# Patient Record
Sex: Female | Born: 1945 | Race: Black or African American | Hispanic: No | Marital: Married | Smoking: Never smoker
Health system: Southern US, Community
[De-identification: ages and names within clinical notes are randomized; demographics above are authoritative.]

## PROBLEM LIST (undated history)

## (undated) DIAGNOSIS — Z83719 Family history of colon polyps, unspecified: Secondary | ICD-10-CM

## (undated) DIAGNOSIS — R51 Headache: Secondary | ICD-10-CM

## (undated) DIAGNOSIS — M1990 Unspecified osteoarthritis, unspecified site: Secondary | ICD-10-CM

## (undated) DIAGNOSIS — R519 Headache, unspecified: Secondary | ICD-10-CM

## (undated) DIAGNOSIS — B019 Varicella without complication: Secondary | ICD-10-CM

## (undated) DIAGNOSIS — I10 Essential (primary) hypertension: Secondary | ICD-10-CM

## (undated) DIAGNOSIS — H409 Unspecified glaucoma: Secondary | ICD-10-CM

## (undated) DIAGNOSIS — E119 Type 2 diabetes mellitus without complications: Secondary | ICD-10-CM

## (undated) DIAGNOSIS — N189 Chronic kidney disease, unspecified: Secondary | ICD-10-CM

## (undated) DIAGNOSIS — R011 Cardiac murmur, unspecified: Secondary | ICD-10-CM

## (undated) DIAGNOSIS — Z8371 Family history of colonic polyps: Secondary | ICD-10-CM

## (undated) HISTORY — DX: Headache, unspecified: R51.9

## (undated) HISTORY — DX: Unspecified osteoarthritis, unspecified site: M19.90

## (undated) HISTORY — DX: Family history of colonic polyps: Z83.71

## (undated) HISTORY — DX: Family history of colon polyps, unspecified: Z83.719

## (undated) HISTORY — DX: Unspecified glaucoma: H40.9

## (undated) HISTORY — DX: Cardiac murmur, unspecified: R01.1

## (undated) HISTORY — PX: CHOLECYSTECTOMY: SHX55

## (undated) HISTORY — PX: WISDOM TOOTH EXTRACTION: SHX21

## (undated) HISTORY — PX: KNEE ARTHROSCOPY: SHX127

## (undated) HISTORY — DX: Headache: R51

## (undated) HISTORY — PX: EYE SURGERY: SHX253

## (undated) HISTORY — PX: ABDOMINAL HYSTERECTOMY: SHX81

## (undated) HISTORY — PX: COLONOSCOPY: SHX174

## (undated) HISTORY — PX: TUBAL LIGATION: SHX77

## (undated) HISTORY — DX: Varicella without complication: B01.9

## (undated) HISTORY — PX: FOOT SURGERY: SHX648

---

## 2014-05-08 LAB — LIPID PANEL
Cholesterol: 151 mg/dL (ref 0–200)
HDL: 82 mg/dL — AB (ref 35–70)
LDL Cholesterol: 56 mg/dL
Triglycerides: 65 mg/dL (ref 40–160)

## 2014-05-08 LAB — TSH: TSH: 1.1 u[IU]/mL (ref <=5.90)

## 2014-08-28 LAB — HEMOGLOBIN A1C: HEMOGLOBIN A1C: 6

## 2014-10-01 LAB — CBC AND DIFFERENTIAL
HEMATOCRIT: 40 % (ref 36–46)
Hemoglobin: 13.1 g/dL (ref 12.0–16.0)
Platelets: 371 10*3/uL (ref 150–399)
WBC: 5.8 10^3/mL

## 2014-10-01 LAB — BASIC METABOLIC PANEL
BUN: 10 mg/dL (ref 4–21)
CREATININE: 0.7 mg/dL (ref 0.5–1.1)
POTASSIUM: 4.4 mmol/L (ref 3.4–5.3)
Sodium: 142 mmol/L (ref 137–147)

## 2014-11-05 DIAGNOSIS — K3189 Other diseases of stomach and duodenum: Secondary | ICD-10-CM | POA: Insufficient documentation

## 2015-01-25 HISTORY — PX: TUMOR REMOVAL: SHX12

## 2015-06-19 LAB — LIPID PANEL
CHOLESTEROL: 143 mg/dL (ref 0–200)
HDL: 67 mg/dL (ref 35–70)
LDL CALC: 59 mg/dL

## 2015-06-19 LAB — HEMOGLOBIN A1C: Hemoglobin A1C: 6.5

## 2015-10-28 LAB — LIPID PANEL
Cholesterol: 163 mg/dL (ref 0–200)
HDL: 71 mg/dL — AB (ref 35–70)
LDL Cholesterol: 72 mg/dL
Triglycerides: 100 mg/dL (ref 40–160)

## 2015-10-28 LAB — HEMOGLOBIN A1C: HEMOGLOBIN A1C: 5.9

## 2016-01-17 ENCOUNTER — Encounter (HOSPITAL_COMMUNITY): Payer: Self-pay

## 2016-01-17 ENCOUNTER — Emergency Department (HOSPITAL_COMMUNITY)
Admission: EM | Admit: 2016-01-17 | Discharge: 2016-01-17 | Disposition: A | Discharge status: Home / Self Care | Payer: Medicare Other | Attending: Emergency Medicine | Admitting: Emergency Medicine

## 2016-01-17 DIAGNOSIS — Z7984 Long term (current) use of oral hypoglycemic drugs: Secondary | ICD-10-CM | POA: Diagnosis not present

## 2016-01-17 DIAGNOSIS — R197 Diarrhea, unspecified: Secondary | ICD-10-CM | POA: Insufficient documentation

## 2016-01-17 DIAGNOSIS — R112 Nausea with vomiting, unspecified: Secondary | ICD-10-CM | POA: Insufficient documentation

## 2016-01-17 DIAGNOSIS — E119 Type 2 diabetes mellitus without complications: Secondary | ICD-10-CM | POA: Insufficient documentation

## 2016-01-17 DIAGNOSIS — I10 Essential (primary) hypertension: Secondary | ICD-10-CM | POA: Insufficient documentation

## 2016-01-17 HISTORY — DX: Essential (primary) hypertension: I10

## 2016-01-17 HISTORY — DX: Type 2 diabetes mellitus without complications: E11.9

## 2016-01-17 LAB — URINALYSIS, ROUTINE W REFLEX MICROSCOPIC
Bilirubin Urine: NEGATIVE
GLUCOSE, UA: NEGATIVE mg/dL
KETONES UR: NEGATIVE mg/dL
LEUKOCYTES UA: NEGATIVE
Nitrite: POSITIVE — AB
PROTEIN: NEGATIVE mg/dL
Specific Gravity, Urine: 1.021 (ref 1.005–1.030)
pH: 5 (ref 5.0–8.0)

## 2016-01-17 LAB — COMPREHENSIVE METABOLIC PANEL
ALT: 20 U/L (ref 14–54)
ANION GAP: 8 (ref 5–15)
AST: 23 U/L (ref 15–41)
Albumin: 3.9 g/dL (ref 3.5–5.0)
Alkaline Phosphatase: 62 U/L (ref 38–126)
BUN: 12 mg/dL (ref 6–20)
CALCIUM: 9.4 mg/dL (ref 8.9–10.3)
CHLORIDE: 103 mmol/L (ref 101–111)
CO2: 28 mmol/L (ref 22–32)
CREATININE: 0.83 mg/dL (ref 0.44–1.00)
Glucose, Bld: 125 mg/dL — ABNORMAL HIGH (ref 65–99)
Potassium: 4.3 mmol/L (ref 3.5–5.1)
SODIUM: 139 mmol/L (ref 135–145)
Total Bilirubin: 0.9 mg/dL (ref 0.3–1.2)
Total Protein: 7.7 g/dL (ref 6.5–8.1)

## 2016-01-17 LAB — CBC
HCT: 43.1 % (ref 36.0–46.0)
HEMOGLOBIN: 14 g/dL (ref 12.0–15.0)
MCH: 26.4 pg (ref 26.0–34.0)
MCHC: 32.5 g/dL (ref 30.0–36.0)
MCV: 81.3 fL (ref 78.0–100.0)
PLATELETS: 329 10*3/uL (ref 150–400)
RBC: 5.3 MIL/uL — AB (ref 3.87–5.11)
RDW: 14.9 % (ref 11.5–15.5)
WBC: 8 10*3/uL (ref 4.0–10.5)

## 2016-01-17 LAB — LIPASE, BLOOD: LIPASE: 21 U/L (ref 11–51)

## 2016-01-17 MED ORDER — MORPHINE SULFATE (PF) 4 MG/ML IV SOLN
4.0000 mg | Freq: Once | INTRAVENOUS | Status: AC
  Administered 2016-01-17: 4 mg via INTRAVENOUS
  Filled 2016-01-17: qty 1

## 2016-01-17 MED ORDER — SODIUM CHLORIDE 0.9 % IV BOLUS (SEPSIS)
2000.0000 mL | Freq: Once | INTRAVENOUS | Status: AC
  Administered 2016-01-17: 2000 mL via INTRAVENOUS

## 2016-01-17 MED ORDER — LOPERAMIDE HCL 2 MG PO CAPS: 2.0000 mg | ORAL_CAPSULE | Freq: Four times a day (QID) | ORAL | 0 refills | Status: DC | PRN

## 2016-01-17 MED ORDER — ONDANSETRON HCL 4 MG/2ML IJ SOLN
4.0000 mg | Freq: Once | INTRAMUSCULAR | Status: AC
  Administered 2016-01-17: 4 mg via INTRAVENOUS
  Filled 2016-01-17: qty 2

## 2016-01-17 MED ORDER — ONDANSETRON 8 MG PO TBDP: 8.0000 mg | ORAL_TABLET | Freq: Three times a day (TID) | ORAL | 0 refills | Status: DC | PRN

## 2016-01-17 NOTE — ED Provider Notes (Signed)
[facility] DEPT Provider Note   CSN: [id number] Arrival date & time: 01/17/16  1145     History   Chief Complaint Chief Complaint  Patient presents with  . Emesis  . Diarrhea    HPI Kelsey Conway is a 70 y.o. female.  HPI Patient presents to the emergency room with complaints of vomiting and diarrhea. Patient went out to eat last evening. About 30 minutes after finishing her meal and leaving the restaurant she started having vomiting, diarrhea and abdominal pain. No one else became ill though. She's had maybe 5-6 episodes of vomiting as well as the same amount of diarrhea since then. The emesis has been dark. She has not noticed any blood in her stool.  She is having diffuse abdominal cramping that is moderate to severe.  She went to an urgent care who sent her to the ED. Past Medical History:  Diagnosis Date  . Diabetes mellitus without complication ([place])   . Hypertension     There are no active problems to display for this patient.   History reviewed. No pertinent surgical history.  OB History    No data available       Home Medications    Prior to Admission medications   Medication Sig Start Date End Date Taking? Authorizing Provider  atorvastatin (LIPITOR) 20 MG tablet Take 20 mg by mouth daily.   Yes Historical Provider, MD  hydrochlorothiazide (HYDRODIURIL) 25 MG tablet Take 25 mg by mouth daily.   Yes Historical Provider, MD  metFORMIN (GLUCOPHAGE) 500 MG tablet Take 500 mg by mouth 2 (two) times daily with a meal.   Yes Historical Provider, MD  loperamide (IMODIUM) 2 MG capsule Take 1 capsule (2 mg total) by mouth 4 (four) times daily as needed for diarrhea or loose stools. 01/17/16   Dorie Rank, MD  ondansetron (ZOFRAN ODT) 8 MG disintegrating tablet Take 1 tablet (8 mg total) by mouth every 8 (eight) hours as needed for nausea or vomiting. 01/17/16   Dorie Rank, MD    Family History No family history on file.  Social History Social History  Substance  Use Topics  . Smoking status: Never Smoker  . Smokeless tobacco: Never Used  . Alcohol use No     Allergies   Patient has no known allergies.   Review of Systems Review of Systems  All other systems reviewed and are negative.    Physical Exam Updated Vital Signs BP 159/71   Pulse 76   Temp 98.6 F (37 C) (Oral)   Resp 16   Ht 5\' 3"  (1.6 m)   Wt 87.1 kg   SpO2 99%   BMI 34.01 kg/m   Physical Exam  Constitutional: She appears well-developed and well-nourished. No distress.  HENT:  Head: Normocephalic and atraumatic.  Right Ear: External ear normal.  Left Ear: External ear normal.  Eyes: Conjunctivae are normal. Right eye exhibits no discharge. Left eye exhibits no discharge. No scleral icterus.  Neck: Neck supple. No tracheal deviation present.  Cardiovascular: Normal rate, regular rhythm and intact distal pulses.   Pulmonary/Chest: Effort normal and breath sounds normal. No stridor. No respiratory distress. She has no wheezes. She has no rales.  Abdominal: Soft. Bowel sounds are normal. She exhibits no distension. There is generalized tenderness. There is no rebound and no guarding.  Musculoskeletal: She exhibits no edema or tenderness.  Neurological: She is alert. She has normal strength. No cranial nerve deficit (no facial droop, extraocular movements intact, no slurred speech)  or sensory deficit. She exhibits normal muscle tone. She displays no seizure activity. Coordination normal.  Skin: Skin is warm and dry. No rash noted.  Psychiatric: She has a normal mood and affect.  Nursing note and vitals reviewed.    ED Treatments / Results  Labs (all labs ordered are listed, but only abnormal results are displayed) Labs Reviewed  COMPREHENSIVE METABOLIC PANEL - Abnormal; Notable for the following:       Result Value   Glucose, Bld 125 (*)    All other components within normal limits  CBC - Abnormal; Notable for the following:    RBC 5.30 (*)    All other  components within normal limits  URINALYSIS, ROUTINE W REFLEX MICROSCOPIC - Abnormal; Notable for the following:    APPearance HAZY (*)    Hgb urine dipstick MODERATE (*)    Nitrite POSITIVE (*)    Bacteria, UA MANY (*)    Squamous Epithelial / LPF 0-5 (*)    All other components within normal limits  LIPASE, BLOOD   Procedures Procedures (including critical care time)  Medications Ordered in ED Medications  sodium chloride 0.9 % bolus 2,000 mL (0 mLs Intravenous Stopped 01/17/16 1415)  ondansetron (ZOFRAN) injection 4 mg (4 mg Intravenous Given 01/17/16 1313)  morphine 4 MG/ML injection 4 mg (4 mg Intravenous Given 01/17/16 1313)     Initial Impression / Assessment and Plan / ED Course  I have reviewed the triage vital signs and the nursing notes.  Pertinent labs & imaging results that were available during my care of the patient were reviewed by me and considered in my medical decision making (see chart for details).  Clinical Course   Patient's symptoms improved with IV fluids, antiemetics and pain medications. She has no focal tenderness on exam. Laboratory tests are reassuring.  I suspect her symptoms are related to a viral gastroenteritis.  Plan on discharge home with prescription for Zofran and Imodium. Warning signs and precautions were discussed.   Final Clinical Impressions(s) / ED Diagnoses   Final diagnoses:  Nausea vomiting and diarrhea    New Prescriptions New Prescriptions   LOPERAMIDE (IMODIUM) 2 MG CAPSULE    Take 1 capsule (2 mg total) by mouth 4 (four) times daily as needed for diarrhea or loose stools.   ONDANSETRON (ZOFRAN ODT) 8 MG DISINTEGRATING TABLET    Take 1 tablet (8 mg total) by mouth every 8 (eight) hours as needed for nausea or vomiting.     Dorie Rank, MD 01/17/16 [phone]

## 2016-01-17 NOTE — ED Triage Notes (Signed)
Patient complains of abdominal pain and cramping after eating japanese last pm with vomiting and diarrhea. Patient here for ongoing discomfort and possible dehydration

## 2016-04-21 ENCOUNTER — Ambulatory Visit (INDEPENDENT_AMBULATORY_CARE_PROVIDER_SITE_OTHER): Payer: Medicare Other | Admitting: Physician Assistant

## 2016-04-21 ENCOUNTER — Encounter: Payer: Self-pay | Admitting: Physician Assistant

## 2016-04-21 ENCOUNTER — Ambulatory Visit (INDEPENDENT_AMBULATORY_CARE_PROVIDER_SITE_OTHER): Payer: Medicare Other

## 2016-04-21 ENCOUNTER — Emergency Department (HOSPITAL_COMMUNITY)
Admission: EM | Admit: 2016-04-21 | Discharge: 2016-04-21 | Disposition: A | Discharge status: Home / Self Care | Payer: Medicare Other | Attending: Emergency Medicine | Admitting: Emergency Medicine

## 2016-04-21 ENCOUNTER — Encounter (HOSPITAL_COMMUNITY): Payer: Self-pay

## 2016-04-21 VITALS — BP 136/84 | HR 64 | Temp 98.2°F | Resp 16 | Ht 64.0 in | Wt 193.0 lb

## 2016-04-21 DIAGNOSIS — R9431 Abnormal electrocardiogram [ECG] [EKG]: Secondary | ICD-10-CM

## 2016-04-21 DIAGNOSIS — R0789 Other chest pain: Secondary | ICD-10-CM | POA: Diagnosis not present

## 2016-04-21 DIAGNOSIS — Z79899 Other long term (current) drug therapy: Secondary | ICD-10-CM | POA: Insufficient documentation

## 2016-04-21 DIAGNOSIS — E119 Type 2 diabetes mellitus without complications: Secondary | ICD-10-CM | POA: Insufficient documentation

## 2016-04-21 DIAGNOSIS — I10 Essential (primary) hypertension: Secondary | ICD-10-CM | POA: Insufficient documentation

## 2016-04-21 DIAGNOSIS — Z91040 Latex allergy status: Secondary | ICD-10-CM | POA: Diagnosis not present

## 2016-04-21 DIAGNOSIS — Z7984 Long term (current) use of oral hypoglycemic drugs: Secondary | ICD-10-CM | POA: Insufficient documentation

## 2016-04-21 LAB — BASIC METABOLIC PANEL
Anion gap: 10 (ref 5–15)
BUN: 10 mg/dL (ref 6–20)
CALCIUM: 9.4 mg/dL (ref 8.9–10.3)
CO2: 29 mmol/L (ref 22–32)
CREATININE: 0.75 mg/dL (ref 0.44–1.00)
Chloride: 101 mmol/L (ref 101–111)
GFR calc non Af Amer: >60 mL/min (ref >60)
Glucose, Bld: 105 mg/dL — ABNORMAL HIGH (ref 65–99)
Potassium: 3.7 mmol/L (ref 3.5–5.1)
SODIUM: 140 mmol/L (ref 135–145)

## 2016-04-21 LAB — CBC WITH DIFFERENTIAL/PLATELET
BASOS PCT: 0 %
Basophils Absolute: 0 10*3/uL (ref 0.0–0.1)
EOS ABS: 0 10*3/uL (ref 0.0–0.7)
EOS PCT: 1 %
HCT: 39.7 % (ref 36.0–46.0)
HEMOGLOBIN: 12.6 g/dL (ref 12.0–15.0)
Lymphocytes Relative: 42 %
Lymphs Abs: 2.4 10*3/uL (ref 0.7–4.0)
MCH: 26 pg (ref 26.0–34.0)
MCHC: 31.7 g/dL (ref 30.0–36.0)
MCV: 82 fL (ref 78.0–100.0)
MONOS PCT: 6 %
Monocytes Absolute: 0.4 10*3/uL (ref 0.1–1.0)
NEUTROS PCT: 51 %
Neutro Abs: 3 10*3/uL (ref 1.7–7.7)
Platelets: 340 10*3/uL (ref 150–400)
RBC: 4.84 MIL/uL (ref 3.87–5.11)
RDW: 14.6 % (ref 11.5–15.5)
WBC: 5.8 10*3/uL (ref 4.0–10.5)

## 2016-04-21 LAB — I-STAT TROPONIN, ED: TROPONIN I, POC: 0 ng/mL (ref 0.00–0.08)

## 2016-04-21 MED ORDER — NITROGLYCERIN 0.3 MG SL SUBL
0.4000 mg | SUBLINGUAL_TABLET | SUBLINGUAL | Status: DC | PRN
  Administered 2016-04-21: 0.3 mg via SUBLINGUAL

## 2016-04-21 MED ORDER — ACETAMINOPHEN 325 MG PO TABS: 650.0000 mg | ORAL_TABLET | Freq: Four times a day (QID) | ORAL | 0 refills | Status: DC | PRN

## 2016-04-21 MED ORDER — ACETAMINOPHEN 325 MG PO TABS
650.0000 mg | ORAL_TABLET | Freq: Once | ORAL | Status: AC
  Administered 2016-04-21: 650 mg via ORAL
  Filled 2016-04-21: qty 2

## 2016-04-21 MED ORDER — ASPIRIN 81 MG PO CHEW
324.0000 mg | CHEWABLE_TABLET | Freq: Once | ORAL | Status: AC
  Administered 2016-04-21: 324 mg via ORAL

## 2016-04-21 NOTE — Patient Instructions (Signed)
     IF you received an x-ray today, you will receive an invoice from [facility]. Please contact [facility] at [phone] with questions or concerns regarding your invoice.   IF you received labwork today, you will receive an invoice from [facility]. Please contact [facility] at [phone] with questions or concerns regarding your invoice.   Our billing staff will not be able to assist you with questions regarding bills from these companies.  You will be contacted with the lab results as soon as they are available. The fastest way to get your results is to activate your My Chart account. Instructions are located on the last page of this paperwork. If you have not heard from us regarding the results in 2 weeks, please contact this office.     

## 2016-04-21 NOTE — ED Triage Notes (Signed)
Pt from doctor office for EKG changes inverted T waves in anterio septal.

## 2016-04-21 NOTE — ED Provider Notes (Signed)
[facility] DEPT Provider Note   CSN: [id number] Arrival date & time: 04/21/16  1401     History   Chief Complaint Chief Complaint  Patient presents with  . Abnormal ECG    HPI Kelsey Conway is a 71 y.o. female.  Kelsey Conway is a 71 y.o. Female with history diabetes and hypertension who presents to the emergency department complaining of bilateral chest wall pain for the past 3-4 days that has been worse since yesterday. Patient reports pain to her bilateral lower chest wall that is worse with palpation. She reports yesterday while watching TV she had worsening pain to her right lower chest wall. She denies any shortness of breath. She reports going to urgent care today where they performed a chest x-ray and EKG with a found some T-wave inversions on her EKG. No previous EKG to compare to. She denies history of MI. She is provided with aspirin and nitroglycerin by urgent care. Patient reports she still having the same pain and her pain is worse with touching. She is not a smoker. She denies shortness of breath. She denies fevers, recent illness, coughing, shortness of breath, palpitations, numbness, tingling, weakness, leg pain, leg swelling or recent long travel.   The history is provided by the patient and medical records. No language interpreter was used.    Past Medical History:  Diagnosis Date  . Arthritis   . Diabetes mellitus without complication ([place])   . Glaucoma   . Heart murmur   . Hypertension     There are no active problems to display for this patient.   Past Surgical History:  Procedure Laterality Date  . ABDOMINAL HYSTERECTOMY    . CHOLECYSTECTOMY    . TUBAL LIGATION    . 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 Authorizing Provider  atorvastatin (LIPITOR) 20 MG tablet Take 20 mg by mouth daily.   Yes Historical  Provider, MD  hydrochlorothiazide (HYDRODIURIL) 25 MG tablet Take 25 mg by mouth daily.   Yes Historical Provider, MD  latanoprost (XALATAN) 0.005 % ophthalmic solution Place 1 drop into both eyes at bedtime.  01/27/16  Yes Historical Provider, MD  lisinopril (PRINIVIL,ZESTRIL) 5 MG tablet Take 5 mg by mouth daily.   Yes Historical Provider, MD  metFORMIN (GLUCOPHAGE) 500 MG tablet Take 500 mg by mouth 2 (two) times daily with a meal.   Yes Historical Provider, MD  zolpidem (AMBIEN) 5 MG tablet Take 5 mg by mouth at bedtime as needed for sleep.  02/29/16  Yes Historical Provider, MD  acetaminophen (TYLENOL) 325 MG tablet Take 2 tablets (650 mg total) by mouth every 6 (six) hours as needed for mild pain or moderate pain. 04/21/16   Waynetta Pean, PA-C    Family History Family History  Problem Relation Age of Onset  . Diabetes Mother   . Hypertension Mother   . Hyperlipidemia Mother   . Diabetes Sister   . Hypertension Sister   . Diabetes Brother     Social History Social History  Substance Use Topics  . Smoking status: Never Smoker  . Smokeless tobacco: Never Used  . Alcohol use No     Allergies   Latex   Review of Systems Review of Systems  Constitutional: Negative for chills and fever.  HENT: Negative for  congestion and sore throat.   Eyes: Negative for visual disturbance.  Respiratory: Negative for cough, shortness of breath and wheezing.   Cardiovascular: Positive for chest pain. Negative for palpitations and leg swelling.  Gastrointestinal: Negative for abdominal pain, nausea and vomiting.  Genitourinary: Negative for dysuria.  Musculoskeletal: Negative for back pain and neck pain.  Skin: Negative for rash.  Neurological: Negative for dizziness, syncope, weakness, light-headedness, numbness and headaches.     Physical Exam Updated Vital Signs BP (!) 165/67   Pulse (!) 50   Resp 13   SpO2 100%   Physical Exam  Constitutional: She is oriented to person, place,  and time. She appears well-developed and well-nourished. No distress.  Nontoxic appearing.  HENT:  Head: Normocephalic and atraumatic.  Mouth/Throat: Oropharynx is clear and moist.  Eyes: Conjunctivae are normal. Pupils are equal, round, and reactive to light. Right eye exhibits no discharge. Left eye exhibits no discharge.  Neck: Neck supple. No JVD present. No tracheal deviation present.  Cardiovascular: Normal rate, regular rhythm, normal heart sounds and intact distal pulses.  Exam reveals no gallop and no friction rub.   No murmur heard. Bilateral radial, posterior tibialis and dorsalis pedis pulses are intact.    Pulmonary/Chest: Effort normal and breath sounds normal. No stridor. No respiratory distress. She has no wheezes. She has no rales. She exhibits tenderness.    Lungs are clear to ascultation bilaterally. Symmetric chest expansion bilaterally. No increased work of breathing. No rales or rhonchi.   Patient's bilateral lower chest wall is tender to palpation reproduces her pain. No crepitus. No overlying skin changes to her chest.  Abdominal: Soft. There is no tenderness. There is no guarding.  Musculoskeletal: She exhibits no edema or tenderness.  No lower extremity edema or tenderness.  Lymphadenopathy:    She has no cervical adenopathy.  Neurological: She is alert and oriented to person, place, and time. Coordination normal.  Skin: Skin is warm and dry. Capillary refill takes less than 2 seconds. No rash noted. She is not diaphoretic. No erythema. No pallor.  Psychiatric: She has a normal mood and affect. Her behavior is normal.  Nursing note and vitals reviewed.    ED Treatments / Results  Labs (all labs ordered are listed, but only abnormal results are displayed) Labs Reviewed  BASIC METABOLIC PANEL - Abnormal; Notable for the following:       Result Value   Glucose, Bld 105 (*)    All other components within normal limits  CBC WITH DIFFERENTIAL/PLATELET  I-STAT  TROPOININ, ED    EKG  EKG Interpretation  Date/Time:  Thursday April 21 2016 14:18:14 EDT Ventricular Rate:  60 PR Interval:    QRS Duration: 97 QT Interval:  436 QTC Calculation: 436 R Axis:   19 Text Interpretation:  Sinus rhythm Nonspecific T abnormalities, anterior leads No previous tracing Confirmed by Ashok Cordia  MD, Lennette Bihari ([id number]) on 04/21/2016 2:20:19 PM       Radiology Dg Chest 2 View  Result Date: 04/21/2016 CLINICAL DATA:  Chest wall pain. EXAM: CHEST  2 VIEW COMPARISON:  None. FINDINGS: The heart size and mediastinal contours are within normal limits. Atherosclerosis of thoracic aorta is noted. No pneumothorax or pleural effusion is noted. Both lungs are clear. The visualized skeletal structures are unremarkable. IMPRESSION: No active cardiopulmonary disease. Electronically Signed   By: Marijo Conception, M.D.   On: 04/21/2016 12:53    Procedures Procedures (including critical care time)  Medications Ordered in ED Medications  acetaminophen (TYLENOL) tablet 650 mg (not administered)     Initial Impression / Assessment and Plan / ED Course  I have reviewed the triage vital signs and the nursing notes.  Pertinent labs & imaging results that were available during my care of the patient were reviewed by me and considered in my medical decision making (see chart for details).     This is a 71 y.o. Female with history diabetes and hypertension who presents to the emergency department complaining of bilateral chest wall pain for the past 3-4 days that has been worse since yesterday. Patient reports pain to her bilateral lower chest wall that is worse with palpation. She reports yesterday while watching TV she had worsening pain to her right lower chest wall. She denies any shortness of breath. She reports going to urgent care today where they performed a chest x-ray and EKG with a found some T-wave inversions on her EKG. No previous EKG to compare to.  On exam the patient is  afebrile and nontoxic appearing. Lungs clear to auscultation bilaterally. She has anterior chest wall tenderness to palpation bilaterally that reproduces her pain. EKG shows nonspecific T-wave abnormalities. No evidence of STEMI. Chest x-ray performed in urgent care is unremarkable. Troponin is not elevated. BMP and CBC are unremarkable. I see no need for repeat troponins patient has been having pain for several days. She has pain that reproduces on palpation. I have low suspicion for ACS at this time. We'll discharge the patient at this time. I will have her refer her to cardiology for possible cardiac stress test and further evaluation. Patient agrees with plan and is comfortable with discharge. I discussed strict and specific return precautions with the patient. I advised the patient to follow-up with their primary care provider this week. I advised the patient to return to the emergency department with new or worsening symptoms or new concerns. The patient verbalized understanding and agreement with plan.   This patient was discussed with and evaluated by Dr. Ashok Cordia who agrees with assessment and plan.   Final Clinical Impressions(s) / ED Diagnoses   Final diagnoses:  Abnormal EKG  Chest wall pain  Essential hypertension    New Prescriptions New Prescriptions   ACETAMINOPHEN (TYLENOL) 325 MG TABLET    Take 2 tablets (650 mg total) by mouth every 6 (six) hours as needed for mild pain or moderate pain.     Waynetta Pean, PA-C 04/21/16 [place], MD 04/21/16 [phone]

## 2016-04-21 NOTE — Progress Notes (Signed)
Chrystie Dimos  MRN: [id number] DOB: 1945-12-05  Subjective:  Kelsey Conway is a 71 y.o. female, with PMH of diabetes, HLD, and HTN, seen in office today for a chief complaint of bilateral sharp chest wall pain x 1 day. Notes it occurred suddenly while she was lying in bed and felt like she was being stabbed with multiple needles. Has persisted since it started yesterday but notes it was worse when it first occured. She got up to go get tylenol and did notice shortness of breath while she was walking to the cabinet. She has also had some left arm pain and upper abdominal pain. Denies acute injury, nausea, vomiting, exertional chest pain, heart palpitations, diaphoresis, and lower leg swelling. Pain is worsened when she moves side to side. Denies smoking and illicit drug use. No FH of heart disease.   She goes to [employer] weekly, just started two weeks ago. She went one week ago and did a new circuit which made her a little sore the next day but note that soreness resolved and does not remind of her of this pain.   Her diabetes is well controlled, checks sugars daily. Range is 110-120s.   Review of Systems  Constitutional: Negative for chills, fatigue and fever.  Respiratory: Positive for cough (intermittent, nonproductive).     There are no active problems to display for this patient.   Current Outpatient Prescriptions on File Prior to Visit  Medication Sig Dispense Refill  . atorvastatin (LIPITOR) 20 MG tablet Take 20 mg by mouth daily.    . hydrochlorothiazide (HYDRODIURIL) 25 MG tablet Take 25 mg by mouth daily.    . metFORMIN (GLUCOPHAGE) 500 MG tablet Take 500 mg by mouth 2 (two) times daily with a meal.     No current facility-administered medications on file prior to visit.     No Known Allergies   Objective:  BP 136/84   Pulse 64   Temp 98.2 F (36.8 C)   Resp 16   Ht 5\' 4"  (1.626 m)   Wt 193 lb (87.5 kg)   SpO2 98%   BMI 33.13 kg/m   Physical Exam  Constitutional:  She is oriented to person, place, and time and well-developed, well-nourished, and in no distress.  HENT:  Head: Normocephalic and atraumatic.  Eyes: Conjunctivae are normal.  Neck: Normal range of motion.  Cardiovascular: Normal rate, regular rhythm, normal heart sounds and intact distal pulses.   Pulmonary/Chest: Effort normal and breath sounds normal. She exhibits tenderness.    Musculoskeletal:       Right lower leg: She exhibits no swelling.       Left lower leg: She exhibits no swelling.  Neurological: She is alert and oriented to person, place, and time. Gait normal.  Skin: Skin is warm and dry.  Psychiatric: Affect normal.  Vitals reviewed.  Dg Chest 2 View  Result Date: 04/21/2016 CLINICAL DATA:  Chest wall pain. EXAM: CHEST  2 VIEW COMPARISON:  None. FINDINGS: The heart size and mediastinal contours are within normal limits. Atherosclerosis of thoracic aorta is noted. No pneumothorax or pleural effusion is noted. Both lungs are clear. The visualized skeletal structures are unremarkable. IMPRESSION: No active cardiopulmonary disease. Electronically Signed   By: Lupita Raider, M.D.   On: 04/21/2016 12:53   EKG shows sinus bradycardia at 58 bpm. There are inverted T waves in lead III, V1, V2, and V3. PR: , QT: . No prior EKG for comparison. Findings presented to Dr. Gwendolyn Grant.  Assessment and Plan :  1. Chest wall pain - EKG 12-Lead - DG Chest 2 View; Future - aspirin chewable tablet 324 mg; Chew 4 tablets (324 mg total) by mouth once. - nitroGLYCERIN (NITROSTAT) SL tablet 0.3 mg; Place 1 tablet (0.3 mg total) under the tongue every 5 (five) minutes as needed for chest pain. 2. Nonspecific abnormal electrocardiogram (ECG) (EKG) Although chest pain is reproducible on exam, pt is an elderly female with hx of T2DM, HTN, HLD, and BMI of 33 with nonspecific T wave inversion on EKG and no prior EKG for comparison. I have discussed this case with Dr. Gwendolyn Grant, who agrees  that patient warrants further evaluation at the ED to rule out ACS. Pt transported to ED via EMS. ACS protocol followed including establishing an IV line, administering sublingual nitroglycerin, and aspirin. No O2 was used as patients pulse ox was 98%.    Benjiman Core PA-C  [facility] 04/21/2016 12:55 PM

## 2016-05-03 ENCOUNTER — Encounter: Payer: Self-pay | Admitting: Family Medicine

## 2016-05-03 ENCOUNTER — Ambulatory Visit (INDEPENDENT_AMBULATORY_CARE_PROVIDER_SITE_OTHER): Payer: Medicare Other | Admitting: Family Medicine

## 2016-05-03 VITALS — BP 128/90 | HR 61 | Resp 12 | Ht 64.0 in | Wt 196.1 lb

## 2016-05-03 DIAGNOSIS — R51 Headache: Secondary | ICD-10-CM

## 2016-05-03 DIAGNOSIS — G4700 Insomnia, unspecified: Secondary | ICD-10-CM | POA: Diagnosis not present

## 2016-05-03 DIAGNOSIS — E119 Type 2 diabetes mellitus without complications: Secondary | ICD-10-CM | POA: Diagnosis not present

## 2016-05-03 DIAGNOSIS — M159 Polyosteoarthritis, unspecified: Secondary | ICD-10-CM | POA: Diagnosis not present

## 2016-05-03 DIAGNOSIS — R9431 Abnormal electrocardiogram [ECG] [EKG]: Secondary | ICD-10-CM | POA: Diagnosis not present

## 2016-05-03 DIAGNOSIS — I10 Essential (primary) hypertension: Secondary | ICD-10-CM | POA: Insufficient documentation

## 2016-05-03 DIAGNOSIS — E049 Nontoxic goiter, unspecified: Secondary | ICD-10-CM | POA: Diagnosis not present

## 2016-05-03 DIAGNOSIS — E1169 Type 2 diabetes mellitus with other specified complication: Secondary | ICD-10-CM | POA: Insufficient documentation

## 2016-05-03 DIAGNOSIS — R519 Headache, unspecified: Secondary | ICD-10-CM

## 2016-05-03 DIAGNOSIS — E118 Type 2 diabetes mellitus with unspecified complications: Secondary | ICD-10-CM | POA: Insufficient documentation

## 2016-05-03 LAB — TSH: TSH: 1.46 u[IU]/mL (ref 0.35–4.50)

## 2016-05-03 LAB — HEMOGLOBIN A1C: Hgb A1c MFr Bld: 6.3 % (ref 4.6–6.5)

## 2016-05-03 LAB — SEDIMENTATION RATE: SED RATE: 20 mm/h (ref 0–30)

## 2016-05-03 LAB — C-REACTIVE PROTEIN: CRP: 1.4 mg/dL (ref 0.5–20.0)

## 2016-05-03 MED ORDER — TRAZODONE HCL 50 MG PO TABS: 25.0000 mg | ORAL_TABLET | Freq: Every evening | ORAL | 1 refills | Status: DC | PRN
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
## 2016-05-03 NOTE — Progress Notes (Signed)
Pre visit review using our clinic review tool, if applicable. No additional management support is needed unless otherwise documented below in the visit note. 

## 2016-05-03 NOTE — Patient Instructions (Addendum)
A few things to remember from today's visit:   Headache, unspecified headache type - Plan: Sedimentation rate, C-reactive protein  Hypertension, essential - Plan: TSH  Insomnia, unspecified type - Plan: traZODone (DESYREL) 50 MG tablet  Type 2 diabetes mellitus without complication, without long-term current use of insulin ([place]) - Plan: Hemoglobin A1c  Generalized osteoarthritis of multiple sites  Blood pressure goal for most people is less than 140/90. Some populations (older than 60) the goal is less than 150/90.  Most recent cardiologists' recommendations recommend blood pressure at or less than 130/80.   Elevated blood pressure increases the risk of strokes, heart and kidney disease, and eye problems. Regular physical activity and a healthy diet (DASH diet) usually help. Low salt diet. Take medications as instructed.  Caution with some over the counter medications as cold medications, dietary products (for weight loss), and Ibuprofen or Aleve (frequent use);all these medications could cause elevation of blood pressure.   Please be sure medication list is accurate. If a new problem present, please set up appointment sooner than planned today.

## 2016-05-10 ENCOUNTER — Encounter: Payer: Self-pay | Admitting: Internal Medicine

## 2016-05-10 ENCOUNTER — Ambulatory Visit (INDEPENDENT_AMBULATORY_CARE_PROVIDER_SITE_OTHER): Payer: Medicare Other | Admitting: Internal Medicine

## 2016-05-10 VITALS — BP 167/81 | HR 63 | Ht 64.0 in | Wt 198.0 lb

## 2016-05-10 DIAGNOSIS — E782 Mixed hyperlipidemia: Secondary | ICD-10-CM

## 2016-05-10 DIAGNOSIS — I10 Essential (primary) hypertension: Secondary | ICD-10-CM | POA: Diagnosis not present

## 2016-05-10 DIAGNOSIS — R9431 Abnormal electrocardiogram [ECG] [EKG]: Secondary | ICD-10-CM

## 2016-05-10 DIAGNOSIS — R079 Chest pain, unspecified: Secondary | ICD-10-CM

## 2016-05-10 DIAGNOSIS — R011 Cardiac murmur, unspecified: Secondary | ICD-10-CM | POA: Diagnosis not present

## 2016-05-10 NOTE — Patient Instructions (Signed)
Your physician has requested that you have an echocardiogram @ [address] - [address]. Echocardiography is a painless test that uses sound waves to create images of your heart. It provides your doctor with information about the size and shape of your heart and how well your heart's chambers and valves are working. This procedure takes approximately one hour. There are no restrictions for this procedure.  Your physician has requested that you have an exercise stress myoview. For further information please visit [email or web address]. Please follow instruction sheet, as given.  Your physician recommends that you schedule a follow-up appointment with Dr. Hilty after your testing.   
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
## 2016-05-12 ENCOUNTER — Telehealth (HOSPITAL_COMMUNITY): Payer: Self-pay

## 2016-05-12 NOTE — Telephone Encounter (Signed)
Encounter complete. 

## 2016-05-17 ENCOUNTER — Ambulatory Visit (HOSPITAL_COMMUNITY)
Admission: RE | Admit: 2016-05-17 | Discharge: 2016-05-17 | Disposition: A | Discharge status: Home / Self Care | Payer: Medicare Other | Source: Ambulatory Visit | Attending: Cardiovascular Disease | Admitting: Cardiovascular Disease

## 2016-05-17 DIAGNOSIS — E669 Obesity, unspecified: Secondary | ICD-10-CM | POA: Diagnosis not present

## 2016-05-17 DIAGNOSIS — Z6834 Body mass index (BMI) 34.0-34.9, adult: Secondary | ICD-10-CM | POA: Insufficient documentation

## 2016-05-17 DIAGNOSIS — H409 Unspecified glaucoma: Secondary | ICD-10-CM | POA: Insufficient documentation

## 2016-05-17 DIAGNOSIS — I10 Essential (primary) hypertension: Secondary | ICD-10-CM | POA: Diagnosis not present

## 2016-05-17 DIAGNOSIS — R9431 Abnormal electrocardiogram [ECG] [EKG]: Secondary | ICD-10-CM

## 2016-05-17 DIAGNOSIS — R079 Chest pain, unspecified: Secondary | ICD-10-CM

## 2016-05-17 DIAGNOSIS — E119 Type 2 diabetes mellitus without complications: Secondary | ICD-10-CM | POA: Diagnosis not present

## 2016-05-17 LAB — MYOCARDIAL PERFUSION IMAGING
CHL CUP MPHR: 151 {beats}/min
CHL CUP NUCLEAR SDS: 0
CHL CUP NUCLEAR SRS: 0
CHL CUP RESTING HR STRESS: 54 {beats}/min
CSEPED: 5 min
CSEPHR: 88 %
CSEPPHR: 134 {beats}/min
Estimated workload: 6.6 METS
Exercise duration (sec): 36 s
LV sys vol: 38 mL
LVDIAVOL: 91 mL (ref 46–106)
NUC STRESS TID: 0.92
RPE: 18
SSS: 0

## 2016-05-17 MED ORDER — TECHNETIUM TC 99M TETROFOSMIN IV KIT
10.9000 | PACK | Freq: Once | INTRAVENOUS | Status: AC | PRN
  Administered 2016-05-17: 10.9 via INTRAVENOUS
  Filled 2016-05-17: qty 11

## 2016-05-17 MED ORDER — TECHNETIUM TC 99M TETROFOSMIN IV KIT
31.0000 | PACK | Freq: Once | INTRAVENOUS | Status: AC | PRN
  Administered 2016-05-17: 31 via INTRAVENOUS
  Filled 2016-05-17: qty 31

## 2016-05-25 ENCOUNTER — Other Ambulatory Visit: Payer: Self-pay

## 2016-05-25 ENCOUNTER — Ambulatory Visit (HOSPITAL_COMMUNITY): Payer: Medicare Other | Attending: Cardiology

## 2016-05-25 DIAGNOSIS — R079 Chest pain, unspecified: Secondary | ICD-10-CM | POA: Diagnosis not present

## 2016-05-25 DIAGNOSIS — I5030 Unspecified diastolic (congestive) heart failure: Secondary | ICD-10-CM | POA: Insufficient documentation

## 2016-05-25 DIAGNOSIS — I361 Nonrheumatic tricuspid (valve) insufficiency: Secondary | ICD-10-CM | POA: Diagnosis not present

## 2016-05-25 DIAGNOSIS — R011 Cardiac murmur, unspecified: Secondary | ICD-10-CM | POA: Diagnosis not present

## 2016-05-25 DIAGNOSIS — I371 Nonrheumatic pulmonary valve insufficiency: Secondary | ICD-10-CM | POA: Insufficient documentation

## 2016-05-25 DIAGNOSIS — I422 Other hypertrophic cardiomyopathy: Secondary | ICD-10-CM | POA: Diagnosis not present

## 2016-05-25 DIAGNOSIS — R9431 Abnormal electrocardiogram [ECG] [EKG]: Secondary | ICD-10-CM | POA: Diagnosis not present

## 2016-05-25 DIAGNOSIS — I340 Nonrheumatic mitral (valve) insufficiency: Secondary | ICD-10-CM | POA: Insufficient documentation

## 2016-06-06 ENCOUNTER — Encounter: Payer: Self-pay | Admitting: Internal Medicine

## 2016-06-06 ENCOUNTER — Ambulatory Visit (INDEPENDENT_AMBULATORY_CARE_PROVIDER_SITE_OTHER): Payer: Medicare Other | Admitting: Internal Medicine

## 2016-06-06 VITALS — BP 131/77 | HR 66 | Ht 64.0 in | Wt 195.0 lb

## 2016-06-06 DIAGNOSIS — R011 Cardiac murmur, unspecified: Secondary | ICD-10-CM

## 2016-06-06 DIAGNOSIS — E782 Mixed hyperlipidemia: Secondary | ICD-10-CM

## 2016-06-06 DIAGNOSIS — R079 Chest pain, unspecified: Secondary | ICD-10-CM

## 2016-06-06 DIAGNOSIS — I10 Essential (primary) hypertension: Secondary | ICD-10-CM | POA: Diagnosis not present

## 2016-06-06 DIAGNOSIS — R9431 Abnormal electrocardiogram [ECG] [EKG]: Secondary | ICD-10-CM

## 2016-06-06 NOTE — Patient Instructions (Signed)
Your physician recommends that you schedule a follow-up appointment as needed  
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
## 2016-06-10 ENCOUNTER — Encounter: Payer: Self-pay | Admitting: Family Medicine

## 2016-07-13 ENCOUNTER — Telehealth: Payer: Self-pay | Admitting: Family Medicine

## 2016-07-13 MED ORDER — HYDROCHLOROTHIAZIDE 25 MG PO TABS: 25.0000 mg | ORAL_TABLET | Freq: Every day | ORAL | 1 refills | Status: DC

## 2016-07-13 MED ORDER — METFORMIN HCL 500 MG PO TABS: 500.0000 mg | ORAL_TABLET | Freq: Two times a day (BID) | ORAL | 1 refills | Status: DC

## 2016-07-13 MED ORDER — ATORVASTATIN CALCIUM 20 MG PO TABS: 20.0000 mg | ORAL_TABLET | Freq: Every day | ORAL | 1 refills | Status: DC

## 2016-07-13 MED ORDER — LISINOPRIL 5 MG PO TABS: 5.0000 mg | ORAL_TABLET | Freq: Every day | ORAL | 1 refills | Status: DC

## 2016-07-13 NOTE — Telephone Encounter (Signed)
Rxs sent to [facility]

## 2016-07-13 NOTE — Telephone Encounter (Signed)
Pt request refill  hydrochlorothiazide (HYDRODIURIL) 25 MG tablet metFORMIN (GLUCOPHAGE) 500 MG tablet lisinopril (PRINIVIL,ZESTRIL) 5 MG tablet atorvastatin (LIPITOR) 20 MG tablet  90 day  [facility] Pt uses this [employer] [phone]

## 2016-07-29 ENCOUNTER — Ambulatory Visit (HOSPITAL_COMMUNITY)
Admission: EM | Admit: 2016-07-29 | Discharge: 2016-07-29 | Disposition: A | Discharge status: Home / Self Care | Payer: Medicare Other | Attending: Internal Medicine | Admitting: Internal Medicine

## 2016-07-29 ENCOUNTER — Encounter (HOSPITAL_COMMUNITY): Payer: Self-pay

## 2016-07-29 DIAGNOSIS — R110 Nausea: Secondary | ICD-10-CM | POA: Diagnosis not present

## 2016-07-29 DIAGNOSIS — R42 Dizziness and giddiness: Secondary | ICD-10-CM | POA: Diagnosis not present

## 2016-07-29 DIAGNOSIS — R109 Unspecified abdominal pain: Secondary | ICD-10-CM

## 2016-07-29 DIAGNOSIS — K219 Gastro-esophageal reflux disease without esophagitis: Secondary | ICD-10-CM

## 2016-07-29 MED ORDER — GI COCKTAIL ~~LOC~~
ORAL | Status: AC
  Filled 2016-07-29: qty 30

## 2016-07-29 MED ORDER — GI COCKTAIL ~~LOC~~
30.0000 mL | Freq: Once | ORAL | Status: AC
  Administered 2016-07-29: 30 mL via ORAL

## 2016-07-29 MED ORDER — ONDANSETRON 4 MG PO TBDP
4.0000 mg | ORAL_TABLET | Freq: Once | ORAL | Status: AC
  Administered 2016-07-29: 4 mg via ORAL

## 2016-07-29 MED ORDER — OMEPRAZOLE 40 MG PO CPDR: 40.0000 mg | DELAYED_RELEASE_CAPSULE | Freq: Two times a day (BID) | ORAL | 0 refills | Status: DC

## 2016-07-29 MED ORDER — ONDANSETRON 4 MG PO TBDP
ORAL_TABLET | ORAL | Status: AC
  Filled 2016-07-29: qty 1

## 2016-07-29 MED ORDER — RANITIDINE HCL 300 MG PO TABS: 300.0000 mg | ORAL_TABLET | Freq: Every day | ORAL | 2 refills | Status: DC

## 2016-07-29 NOTE — Discharge Instructions (Signed)
Your signs and symptoms are consistent with acid reflux. Started on Prilosec, take one tablet twice a day for week, also start on Zantac, take one tablet every night at bedtime, this medicine can be continued as needed as long as you want. If your symptoms persist, follow-up with her primary care provider in one to 2 weeks.

## 2016-07-29 NOTE — ED Triage Notes (Signed)
Pt said for 2 days she has been having nausea, but no vomiting and terrible abdominal pain and burning pain in her stomach. Said she is also having dizziness and lightheadedness. Said she has noticed it gets worse at night. Has been drinking ginger ale to try to settle her stomach. Did try her husband meclizine without relief.

## 2016-07-29 NOTE — ED Provider Notes (Signed)
CSN: [id number]     Arrival date & time 07/29/16  1316 History   None    Chief Complaint  Patient presents with  . Nausea   (Consider location/radiation/quality/duration/timing/severity/associated sxs/prior Treatment) 71 year old female with past history of diabetes, heart murmur, and hypertension presents to clinic with 2 days of epigastric burning. Worse at night when she is laying down, and after food. Also nausea and dizziness as well. Denies any other complaints.   The history is provided by the patient.  Abdominal Pain  Pain location:  Epigastric Pain quality: burning   Pain quality: not stabbing, not tearing and not throbbing   Pain radiates to:  Does not radiate Pain severity:  Moderate Onset quality:  Gradual Duration:  2 days Timing:  Constant Progression:  Unchanged Chronicity:  New Context: eating   Context: not awakening from sleep, not diet changes, not medication withdrawal, not recent travel, not retching, not sick contacts, not suspicious food intake and not trauma   Relieved by:  Nothing Worsened by:  Eating (laying down) Associated symptoms: nausea   Associated symptoms: no anorexia, no chest pain, no chills, no diarrhea, no dysuria, no fatigue and no vomiting     Past Medical History:  Diagnosis Date  . Arthritis   . Chicken pox   . Diabetes mellitus without complication ([place])   . Family history of polyps in the colon   . Frequent headaches   . Glaucoma   . Heart murmur   . Hypertension    Past Surgical History:  Procedure Laterality Date  . ABDOMINAL HYSTERECTOMY    . CHOLECYSTECTOMY    . TUBAL LIGATION    . TUMOR REMOVAL  2017   Stomach tumor   Family History  Problem Relation Age of Onset  . Diabetes Mother   . Hypertension Mother   . Hyperlipidemia Mother   . Diabetes Sister   . Hypertension Sister   . Diabetes Brother    Social History  Substance Use Topics  . Smoking status: Never Smoker  . Smokeless tobacco: Never Used  .  Alcohol use No   OB History    No data available     Review of Systems  Constitutional: Negative for chills and fatigue.  HENT: Negative.   Respiratory: Negative.   Cardiovascular: Negative for chest pain and palpitations.  Gastrointestinal: Positive for abdominal pain and nausea. Negative for anorexia, diarrhea and vomiting.  Genitourinary: Negative for dysuria and frequency.  Musculoskeletal: Negative.   Skin: Negative.   Neurological: Positive for dizziness. Negative for light-headedness and headaches.    Allergies  Latex  Home Medications   Prior to Admission medications   Medication Sig Start Date End Date Taking? Authorizing Provider  atorvastatin (LIPITOR) 20 MG tablet Take 1 tablet (20 mg total) by mouth daily. 07/13/16  Yes [name], [name], MD  hydrochlorothiazide (HYDRODIURIL) 25 MG tablet Take 1 tablet (25 mg total) by mouth daily. 07/13/16  Yes [name], [name], MD  lisinopril (PRINIVIL,ZESTRIL) 5 MG tablet Take 1 tablet (5 mg total) by mouth daily. 07/13/16  Yes [name], [name], MD  metFORMIN (GLUCOPHAGE) 500 MG tablet Take 1 tablet (500 mg total) by mouth 2 (two) times daily with a meal. 07/13/16  Yes [name], [name], MD  latanoprost (XALATAN) 0.005 % ophthalmic solution Place 1 drop into both eyes at bedtime.  01/27/16   [provider]  omeprazole (PRILOSEC) 40 MG capsule Take 1 capsule (40 mg total) by mouth 2 (two) times daily. 07/29/16 08/12/16  Barnet Glasgow, NP  ranitidine (ZANTAC) 300 MG tablet Take 1 tablet (300 mg total) by mouth at bedtime. 07/29/16   Barnet Glasgow, NP  traZODone (DESYREL) 50 MG tablet Take 0.5-1 tablets (25-50 mg total) by mouth at bedtime as needed for sleep. 05/03/16   [name], [name], MD   Meds Ordered and Administered this Visit   Medications  gi cocktail (Maalox,Lidocaine,Donnatal) (30 mLs Oral Given 07/29/16 1454)  ondansetron (ZOFRAN-ODT) disintegrating tablet 4 mg (4 mg Oral Given 07/29/16 1455)    BP 128/63 (BP Location:  Right Arm)   Pulse (!) 59   Temp 98.1 F (36.7 C) (Oral)   Resp 20   SpO2 97%  No data found.   Physical Exam  Constitutional: She is oriented to person, place, and time. She appears well-developed and well-nourished. No distress.  HENT:  Head: Normocephalic.  Right Ear: Tympanic membrane and external ear normal.  Left Ear: Tympanic membrane and external ear normal.  Nose: Nose normal.  Mouth/Throat: Oropharynx is clear and moist.  Eyes: Conjunctivae are normal. Pupils are equal, round, and reactive to light.  Neck: Normal range of motion. Neck supple.  Cardiovascular: Normal rate and regular rhythm.   Pulmonary/Chest: Effort normal and breath sounds normal.  Abdominal: Soft. Normal appearance and bowel sounds are normal. There is tenderness in the epigastric area. There is no rebound.  Neurological: She is alert and oriented to person, place, and time.  Skin: Skin is warm and dry. Capillary refill takes less than 2 seconds. She is not diaphoretic.  Psychiatric: She has a normal mood and affect. Her behavior is normal.  Nursing note and vitals reviewed.   Urgent Care Course     ED EKG Date/Time: 07/29/2016 3:47 PM Performed by: Barnet Glasgow Authorized by: Barnet Glasgow   ECG reviewed by ED Physician in the absence of a cardiologist: no   Previous ECG:    Previous ECG:  Compared to current   Comparison ECG info:  03/2016   Similarity:  No change Interpretation:    Interpretation: normal   Rate:    ECG rate:  54   ECG rate assessment: bradycardic   Rhythm:    Rhythm: sinus bradycardia   Ectopy:    Ectopy: none   QRS:    QRS axis:  Normal Conduction:    Conduction: normal   ST segments:    ST segments:  Normal T waves:    T waves: inverted     Inverted:  V1, V2 and V3 Comments:     PR 172 ms QRS 88 ms QT/QTc 412/390 ms  T-wave inversion consistent with her previous EKG taken March 2018. Has had prior cardiac workup for this with no significant  findings   (including critical care time)  Labs Review Labs Reviewed - No data to display  Imaging Review No results found.     MDM   1. Gastroesophageal reflux disease, esophagitis presence not specified    Symptoms consistent with acid reflux. Started on Prilosec and Zantac, follow-up with primary care in 1-2 week as needed.     Barnet Glasgow, NP 07/29/16 1550

## 2016-08-02 NOTE — Progress Notes (Signed)
HPI:   Kelsey Conway is a 71 y.o. female, who is here today to follow on some chronic medical problems.  Last seen on 05/03/16. Since her last OV she has seen Dr Debara Pickett, cardiologists.   Hypertension:   Currently on HCTZ 25 mg and Lisinopril 5 mg.   BP readings 120'/60's. Last eye exam over a year ago.  She is taking medications as instructed, no side effects reported.  She has not noted headache, visual changes, exertional chest pain, dyspnea,  focal weakness, or edema.   Lab Results  Component Value Date   CREATININE 0.75 04/21/2016   BUN 10 04/21/2016   NA 140 04/21/2016   K 3.7 04/21/2016   CL 101 04/21/2016   CO2 29 04/21/2016    Insomnia:  She tookTrazodone 50 mg at bedtime for 4-5 days and discontinued because did not help. Sleeping about 4 hours.  Headaches:  Last OV she was c/o 3 days of headache, bitemporal,8/10. No associated symptoms. Resolved.   She was in the ER recently, 07/29/16, c/o nausea and epigastric burning sensation.  Dx with GERD. Started on Zantac and Prilosec 20 mg , the latter one exacerbated symptoms and caused diarrhea. Improved after discontinued.   She has not identified exacerbating factors.   Denies vomiting, changes in bowel habits, blood in stool or melena. She would like an appt with GI.   For at least month she has had lateral right hip pain, radiated to lateral aspect of knee. Exacerbated by walking and lying on right side.. No Hx of injury. Achy pain, 9/10. Hx of generalized OA, has reported chronic cervical,shoulders,hips,and knees pain.   Left thigh intermittent numbness for at least 3 weeks. Bilateral lower back pain. No bowel or urine incontinence, denies saddle anesthesia.  Tylenol does not help. Hx of DM II, BS's 120's. Lab Results  Component Value Date   HGBA1C 6.3 05/03/2016     Review of Systems  Constitutional: Positive for fatigue. Negative for activity change, appetite change, fever  and unexpected weight change.  HENT: Negative for mouth sores, nosebleeds and trouble swallowing.   Eyes: Negative for redness and visual disturbance.  Respiratory: Negative for cough, shortness of breath and wheezing.   Cardiovascular: Negative for chest pain, palpitations and leg swelling.  Gastrointestinal: Positive for abdominal pain and nausea. Negative for blood in stool and vomiting.  Endocrine: Negative for cold intolerance and heat intolerance.  Genitourinary: Negative for decreased urine volume, dysuria and hematuria.  Musculoskeletal: Positive for arthralgias and back pain. Negative for gait problem.  Skin: Negative for color change and rash.  Neurological: Positive for numbness. Negative for syncope, weakness and headaches.  Psychiatric/Behavioral: Positive for sleep disturbance. Negative for confusion. The patient is nervous/anxious.       Current Outpatient Prescriptions on File Prior to Visit  Medication Sig Dispense Refill  . atorvastatin (LIPITOR) 20 MG tablet Take 1 tablet (20 mg total) by mouth daily. 90 tablet 1  . hydrochlorothiazide (HYDRODIURIL) 25 MG tablet Take 1 tablet (25 mg total) by mouth daily. 90 tablet 1  . latanoprost (XALATAN) 0.005 % ophthalmic solution Place 1 drop into both eyes at bedtime.     Marland Kitchen lisinopril (PRINIVIL,ZESTRIL) 5 MG tablet Take 1 tablet (5 mg total) by mouth daily. 90 tablet 1  . metFORMIN (GLUCOPHAGE) 500 MG tablet Take 1 tablet (500 mg total) by mouth 2 (two) times daily with a meal. 180 tablet 1  . ranitidine (ZANTAC) 300 MG tablet Take 1  tablet (300 mg total) by mouth at bedtime. 30 tablet 2   Current Facility-Administered Medications on File Prior to Visit  Medication Dose Route Frequency Provider Last Rate Last Dose  . nitroGLYCERIN (NITROSTAT) SL tablet 0.3 mg  0.3 mg Sublingual Q5 min PRN Tenna Delaine D, PA-C   0.3 mg at 04/21/16 1320     Past Medical History:  Diagnosis Date  . Arthritis   . Chicken pox   . Diabetes  mellitus without complication ([place])   . Family history of polyps in the colon   . Frequent headaches   . Glaucoma   . Heart murmur   . Hypertension    Allergies  Allergen Reactions  . Latex Rash    Powder latex    Social History   Social History  . Marital status: Married    Spouse name: N/A  . Number of children: N/A  . Years of education: N/A   Social History Main Topics  . Smoking status: Never Smoker  . Smokeless tobacco: Never Used  . Alcohol use No  . Drug use: No  . Sexual activity: Not Asked   Other Topics Concern  . None   Social History Narrative  . None    Vitals:   08/03/16 1035  BP: 118/70  Pulse: 71  Resp: 12  O2 sat at RA 96% Body mass index is 32.68 kg/m.   Physical Exam  Nursing note and vitals reviewed. Constitutional: She is oriented to person, place, and time. She appears well-developed. No distress.  HENT:  Head: Atraumatic.  Mouth/Throat: Oropharynx is clear and moist and mucous membranes are normal.  Eyes: Conjunctivae and EOM are normal. Pupils are equal, round, and reactive to light.  Cardiovascular: Normal rate and regular rhythm.   No murmur heard. Pulses:      Dorsalis pedis pulses are 2+ on the right side, and 2+ on the left side.  Respiratory: Effort normal and breath sounds normal. No respiratory distress.  GI: Soft. She exhibits no mass. There is no hepatomegaly. There is no tenderness.  Musculoskeletal: She exhibits no edema.       Lumbar back: She exhibits tenderness. She exhibits no bony tenderness.  Pain lower back right side, lateral aspect of right  hip upon palpation. Right hip pain also elicited with full flexion. Mild limited flexion. Right knee mild limitation flexion. Left hip and knee no pain with ROM.  Lymphadenopathy:    She has no cervical adenopathy.  Neurological: She is alert and oriented to person, place, and time. She has normal strength. Gait normal.  SLR negative bilateral.   Skin: Skin is warm.  No erythema.  Psychiatric: Her mood appears anxious.  Well groomed, good eye contact.     ASSESSMENT AND PLAN:   Ms Charlotta was seen today for follow-up.  Diagnoses and all orders for this visit:  Right hip pain  ? Radicular pain, OA. Treatment options discussed. She is not interested in Cymbalta, discussed last OV. Mobic as needed, side effects discussed. Wt loss may help. Stretching like exercises, Tai Chi for example. F/U in 2 months.  -     Meloxicam (MOBIC) 7.5 MG tablet; Take 1-2 tablets (7.5-15 mg total) by mouth daily as needed for pain.  Essential hypertension  Adequately controlled. No changes in current management. DASH-low salt diet to continue. Eye exam recommended annually. F/U in 6 months, before if needed.  Insomnia, unspecified type  Good sleep hygiene recommended. Doxepin 10 mg to try,some side effects  discussed. F/U in 2 months.  -     Doxepin (SINEQUAN) 10 MG capsule; Take 1 capsule (10 mg total) by mouth at bedtime. 30-45 min before bedtime.  Epigastric abdominal pain  Improved. She is not interested in trying a different PPI. GERD precautions discussed. GI referral placed. Instructed about warning signs.  -     Ambulatory referral to Gastroenterology  Numbness of left anterior thigh  ? Radicular pain. ? Neuropathy. We discussed a few treatment options, she agrees with trying short course of Prednisone, some side effects discussed. Instructed to mnitor BS's closely. Instructed about warning signs. Lumbar MRI will be considered if not better in 4-6 weeks.  -     predniSONE (DELTASONE) 20 MG tablet; 3 tabs for 3 days, 2 tabs for 3 days, 1 tabs for 3 days, and 1/2 tab for 3 days. Take tables together with breakfast.    -Ms. [name] was advised to return sooner than planned today if new concerns arise.       Story Conti G. [name], MD  [facility] [place] office.

## 2016-08-03 ENCOUNTER — Encounter: Payer: Self-pay | Admitting: Gastroenterology

## 2016-08-03 ENCOUNTER — Encounter: Payer: Self-pay | Admitting: Family Medicine

## 2016-08-03 ENCOUNTER — Other Ambulatory Visit: Payer: Self-pay | Admitting: Family Medicine

## 2016-08-03 ENCOUNTER — Ambulatory Visit (INDEPENDENT_AMBULATORY_CARE_PROVIDER_SITE_OTHER): Payer: Medicare Other | Admitting: Family Medicine

## 2016-08-03 VITALS — BP 118/70 | HR 71 | Resp 12 | Ht 64.0 in | Wt 190.4 lb

## 2016-08-03 DIAGNOSIS — G4700 Insomnia, unspecified: Secondary | ICD-10-CM

## 2016-08-03 DIAGNOSIS — M25551 Pain in right hip: Secondary | ICD-10-CM | POA: Diagnosis not present

## 2016-08-03 DIAGNOSIS — R1013 Epigastric pain: Secondary | ICD-10-CM

## 2016-08-03 DIAGNOSIS — I10 Essential (primary) hypertension: Secondary | ICD-10-CM

## 2016-08-03 DIAGNOSIS — R200 Anesthesia of skin: Secondary | ICD-10-CM

## 2016-08-03 MED ORDER — DOXEPIN HCL 10 MG PO CAPS: 10.0000 mg | ORAL_CAPSULE | Freq: Every day | ORAL | 1 refills | Status: DC

## 2016-08-03 MED ORDER — PREDNISONE 20 MG PO TABS: ORAL_TABLET | ORAL | 0 refills | Status: AC

## 2016-08-03 MED ORDER — MELOXICAM 7.5 MG PO TABS: 7.5000 mg | ORAL_TABLET | Freq: Every day | ORAL | 1 refills | Status: DC | PRN

## 2016-08-03 NOTE — Patient Instructions (Signed)
A few things to remember from today's visit:   Essential hypertension  Insomnia, unspecified type - Plan: doxepin (SINEQUAN) 10 MG capsule  Epigastric abdominal pain - Plan: Ambulatory referral to Gastroenterology  Numbness of left anterior thigh - Plan: predniSONE (DELTASONE) 20 MG tablet  Right hip pain - Plan: meloxicam (MOBIC) 7.5 MG tablet  Take Prednisone with food.    Avoid foods that make your symptoms worse, for example coffee, chocolate,pepermeint,alcohol, and greasy food. Raising the head of your bed about 6 inches may help with nocturnal symptoms.  Avoid tobacco use. Weight loss (if you are overweight). Avoid lying down for 3 hours after eating.  Instead 3 large meals daily try small and more frequent meals during the day.    You should be evaluated immediately if bloody vomiting, bloody stools, black stools (like tar), difficulty swallowing, food gets stuck on the way down or choking when eating. Abnormal weight loss or severe abdominal pain.  Marland Kitchen   Please be sure medication list is accurate. If a new problem present, please set up appointment sooner than planned today.

## 2016-08-18 ENCOUNTER — Telehealth: Payer: Self-pay | Admitting: Family Medicine

## 2016-08-18 ENCOUNTER — Other Ambulatory Visit: Payer: Self-pay

## 2016-08-18 MED ORDER — GLUCOSE BLOOD VI STRP: ORAL_STRIP | 12 refills | Status: DC

## 2016-08-18 MED ORDER — LANCETS 30G MISC: 5 refills | Status: DC

## 2016-08-18 NOTE — Telephone Encounter (Signed)
Rx's sent. Nothing further needed.

## 2016-08-18 NOTE — Telephone Encounter (Signed)
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

## 2016-08-25 ENCOUNTER — Telehealth: Payer: Self-pay | Admitting: Family Medicine

## 2016-08-25 NOTE — Telephone Encounter (Signed)
Since pt is new to the area, she has never gone for a mammogram in [place].  Would like a referral to the breast center.

## 2016-08-26 ENCOUNTER — Other Ambulatory Visit: Payer: Self-pay | Admitting: Family Medicine

## 2016-08-26 DIAGNOSIS — Z1239 Encounter for other screening for malignant neoplasm of breast: Secondary | ICD-10-CM

## 2016-08-26 NOTE — Telephone Encounter (Signed)
Okay to order mammogram

## 2016-08-26 NOTE — Telephone Encounter (Signed)
Order for mammogram placed. Thanks, BJ

## 2016-09-12 ENCOUNTER — Ambulatory Visit
Admission: RE | Admit: 2016-09-12 | Discharge: 2016-09-12 | Disposition: A | Discharge status: Home / Self Care | Payer: Medicare Other | Source: Ambulatory Visit | Attending: Family Medicine | Admitting: Family Medicine

## 2016-09-12 DIAGNOSIS — Z1239 Encounter for other screening for malignant neoplasm of breast: Secondary | ICD-10-CM

## 2016-09-12 DIAGNOSIS — Z1231 Encounter for screening mammogram for malignant neoplasm of breast: Secondary | ICD-10-CM | POA: Diagnosis not present

## 2016-09-20 ENCOUNTER — Ambulatory Visit (INDEPENDENT_AMBULATORY_CARE_PROVIDER_SITE_OTHER): Payer: Medicare Other | Admitting: Gastroenterology

## 2016-09-20 ENCOUNTER — Encounter: Payer: Self-pay | Admitting: Gastroenterology

## 2016-09-20 VITALS — BP 150/78 | HR 61 | Ht 64.0 in | Wt 197.0 lb

## 2016-09-20 DIAGNOSIS — K3189 Other diseases of stomach and duodenum: Secondary | ICD-10-CM

## 2016-09-20 DIAGNOSIS — R1907 Generalized intra-abdominal and pelvic swelling, mass and lump: Secondary | ICD-10-CM | POA: Diagnosis not present

## 2016-09-20 DIAGNOSIS — K319 Disease of stomach and duodenum, unspecified: Secondary | ICD-10-CM | POA: Diagnosis not present

## 2016-09-20 DIAGNOSIS — R1084 Generalized abdominal pain: Secondary | ICD-10-CM | POA: Diagnosis not present
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
## 2016-09-20 NOTE — Patient Instructions (Signed)
You have been scheduled for a CT scan of the abdomen and pelvis at [facility] ([address]---this is in the same building as [facility]).   You are scheduled on 09-23-2016 at 330pm. You should arrive 15 minutes prior to your appointment time for registration. Please follow the written instructions below on the day of your exam:  WARNING: IF YOU ARE ALLERGIC TO IODINE/X-RAY DYE, PLEASE NOTIFY RADIOLOGY IMMEDIATELY AT [phone]! YOU WILL BE GIVEN A 13 HOUR PREMEDICATION PREP.  1) Do not eat anything after 1130am (4 hours prior to your test) 2) You have been given 2 bottles of oral contrast to drink. The solution may taste better if refrigerated, but do NOT add ice or any other liquid to this solution. Shake well before drinking.    Drink 1 bottle of contrast @ 130pm (2 hours prior to your exam)  Drink 1 bottle of contrast @ 230pm (1 hour prior to your exam)  You may take any medications as prescribed with a small amount of water except for the following: Metformin, Glucophage, Glucovance, Avandamet, Riomet, Fortamet, Actoplus Met, Janumet, Glumetza or Metaglip. The above medications must be held the day of the exam AND 48 hours after the exam.  The purpose of you drinking the oral contrast is to aid in the visualization of your intestinal tract. The contrast solution may cause some diarrhea. Before your exam is started, you will be given a small amount of fluid to drink. Depending on your individual set of symptoms, you may also receive an intravenous injection of x-ray contrast/dye. Plan on being at [facility] for 30 minutes or longer, depending on the type of exam you are having performed.  This test typically takes 30-45 minutes to complete.  If you have any questions regarding your exam or if you need to reschedule, you may call the CT department at [phone] between the hours of 8:00 am and 5:00 pm,  Monday-Friday.  ________________________________________________________________________  If you are age 104 or older, your body mass index should be between 23-30. Your Body mass index is 33.81 kg/m. If this is out of the aforementioned range listed, please consider follow up with your Primary Care Provider.  If you are age 57 or younger, your body mass index should be between 19-25. Your Body mass index is 33.81 kg/m. If this is out of the aformentioned range listed, please consider follow up with your Primary Care Provider.   Thank you for choosing [facility]  Dr Wilfrid Lund III

## 2016-09-23 ENCOUNTER — Inpatient Hospital Stay: Admission: RE | Admit: 2016-09-23 | Payer: Medicare Other | Source: Ambulatory Visit

## 2016-09-30 ENCOUNTER — Other Ambulatory Visit (INDEPENDENT_AMBULATORY_CARE_PROVIDER_SITE_OTHER): Payer: Medicare Other

## 2016-09-30 DIAGNOSIS — R1907 Generalized intra-abdominal and pelvic swelling, mass and lump: Secondary | ICD-10-CM | POA: Diagnosis not present

## 2016-09-30 DIAGNOSIS — K319 Disease of stomach and duodenum, unspecified: Secondary | ICD-10-CM

## 2016-09-30 DIAGNOSIS — R1084 Generalized abdominal pain: Secondary | ICD-10-CM | POA: Diagnosis not present

## 2016-09-30 DIAGNOSIS — K3189 Other diseases of stomach and duodenum: Secondary | ICD-10-CM

## 2016-09-30 LAB — BASIC METABOLIC PANEL
BUN: 11 mg/dL (ref 6–23)
CHLORIDE: 106 meq/L (ref 96–112)
CO2: 29 meq/L (ref 19–32)
CREATININE: 0.74 mg/dL (ref 0.40–1.20)
Calcium: 9.5 mg/dL (ref 8.4–10.5)
GFR: 99.9 mL/min (ref >60.00)
Glucose, Bld: 98 mg/dL (ref 70–99)
Potassium: 3.6 mEq/L (ref 3.5–5.1)
Sodium: 142 mEq/L (ref 135–145)

## 2016-10-03 ENCOUNTER — Ambulatory Visit (INDEPENDENT_AMBULATORY_CARE_PROVIDER_SITE_OTHER)
Admission: RE | Admit: 2016-10-03 | Discharge: 2016-10-03 | Disposition: A | Discharge status: Home / Self Care | Payer: Medicare Other | Source: Ambulatory Visit | Attending: Gastroenterology | Admitting: Gastroenterology

## 2016-10-03 DIAGNOSIS — R109 Unspecified abdominal pain: Secondary | ICD-10-CM | POA: Diagnosis not present

## 2016-10-03 DIAGNOSIS — R1907 Generalized intra-abdominal and pelvic swelling, mass and lump: Secondary | ICD-10-CM | POA: Diagnosis not present

## 2016-10-03 MED ORDER — IOPAMIDOL (ISOVUE-300) INJECTION 61%: 100.0000 mL | Freq: Once | INTRAVENOUS | Status: DC | PRN

## 2016-10-03 NOTE — Progress Notes (Signed)
HPI:   Kelsey Conway is a 71 y.o. female, who is here today to follow on some chronic medical problems.  Last seen on 08/03/16.   DM II: Dx in 2016. Currently she is on Metformin 500 mg bid.  FG: low 100's. Post prandial: Not checking.  Denies abdominal pain, nausea,vomiting, polydipsia,polyuria, or polyphagia.  She has not noted foot numbness or burning sensation.  Lab Results  Component Value Date   HGBA1C 6.3 05/03/2016   Eye exam 10/13/16. Hx of glaucoma.   Insomnia:  Trazodone did not help. Last OV, 08/03/16, Doxepin 10 mg started but took it for 4 days, stopped because it was causing morning drowsiness.  Sleeping about 3 hours, wakes up "every hour." She thinks her sleep is also aggravated by urinary frequency, nocturia x 3.  This problem has been stable for a while. She denies urinary frequency during the day. She drinks fluids until late afternoon.  Denies dysuria, gross hematuria,or decreased urine output.   HTN:  She is on HCTZ 25 mg daily, which also helps with intermittent LE edema. Lisinopril 5 mg. Denies headache, visual changes, chest pain, dyspnea, palpitation, claudication, focal weakness, or edema.  Lab Results  Component Value Date   CREATININE 0.74 09/30/2016   BUN 11 09/30/2016   NA 142 09/30/2016   K 3.6 09/30/2016   CL 106 09/30/2016   CO2 29 09/30/2016    Right hip pain: Hx of generalized OA, she has not been interested in Cymbalta. Achy hip pain, exacerbated by walking and lying on right side. + Knees and shoulders intermittently. Pain has improved.  Mobic started on 08/03/16, tolerating well.  Last OV she was also c/o at least 3 weeks of left anterior thigh numbness. Prednisone taper recommended. Still having numbness and now has had some on lateral aspect of right thigh. Lower back pain , exacerbated by prolonged standing.  Achy pain, 7/10, not longer radiated to LE's but it was a few months ago. She denies saddle  anesthesia, urine or bowel incontinence, or LE weakness.   Review of Systems  Constitutional: Positive for fatigue. Negative for appetite change, diaphoresis, fever and unexpected weight change.  HENT: Negative for mouth sores, nosebleeds and sore throat.   Eyes: Negative for pain and visual disturbance.  Respiratory: Negative for cough, shortness of breath and wheezing.   Cardiovascular: Negative for chest pain, palpitations and leg swelling.  Gastrointestinal: Negative for abdominal pain, nausea and vomiting.       No changes in bowel habits.  Endocrine: Negative for cold intolerance, heat intolerance, polydipsia, polyphagia and polyuria.  Genitourinary: Negative for decreased urine volume, dysuria, hematuria and pelvic pain.  Musculoskeletal: Positive for arthralgias and back pain. Negative for gait problem and myalgias.  Skin: Negative for pallor and rash.  Neurological: Positive for numbness. Negative for seizures, syncope, weakness and headaches.  Psychiatric/Behavioral: Positive for sleep disturbance. Negative for confusion. The patient is not nervous/anxious.       Current Outpatient Prescriptions on File Prior to Visit  Medication Sig Dispense Refill  . atorvastatin (LIPITOR) 20 MG tablet Take 1 tablet (20 mg total) by mouth daily. 90 tablet 1  . doxepin (SINEQUAN) 10 MG capsule TAKE ONE CAPSULE BY MOUTH 30 TO 45 MINUTES BEFORE BEDTIME 90 capsule 0  . glucose blood (ACCU-CHEK AVIVA) test strip Use as instructed 100 each 12  . Lancets 30G MISC TEST BLOOD SUGAR AS DIRECTED 1 each 5  . latanoprost (XALATAN) 0.005 % ophthalmic solution  Place 1 drop into both eyes at bedtime.      Current Facility-Administered Medications on File Prior to Visit  Medication Dose Route Frequency Provider Last Rate Last Dose  . nitroGLYCERIN (NITROSTAT) SL tablet 0.3 mg  0.3 mg Sublingual Q5 min PRN Tenna Delaine D, PA-C   0.3 mg at 04/21/16 1320     Past Medical History:  Diagnosis Date  .  Arthritis   . Chicken pox   . Diabetes mellitus without complication ([place])   . Family history of polyps in the colon   . Frequent headaches   . Glaucoma   . Heart murmur   . Hypertension    Allergies  Allergen Reactions  . Latex Rash    Powder latex    Social History   Social History  . Marital status: Married    Spouse name: N/A  . Number of children: N/A  . Years of education: N/A   Social History Main Topics  . Smoking status: Never Smoker  . Smokeless tobacco: Never Used  . Alcohol use No  . Drug use: No  . Sexual activity: Not Asked   Other Topics Concern  . None   Social History Narrative  . None    Vitals:   10/04/16 1054  BP: 128/80  Pulse: 77  Resp: 12  SpO2: 98%   Body mass index is 33.73 kg/m.   Physical Exam  Nursing note and vitals reviewed. Constitutional: She is oriented to person, place, and time. She appears well-developed. No distress.  HENT:  Head: Normocephalic and atraumatic.  Mouth/Throat: Oropharynx is clear and moist and mucous membranes are normal.  Eyes: Pupils are equal, round, and reactive to light. Conjunctivae are normal.  Cardiovascular: Normal rate and regular rhythm.   No murmur heard. Pulses:      Dorsalis pedis pulses are 2+ on the right side, and 2+ on the left side.  Respiratory: Effort normal and breath sounds normal. No respiratory distress.  GI: Soft. She exhibits no mass. There is no hepatomegaly. There is no tenderness.  Musculoskeletal: She exhibits no edema.       Lumbar back: She exhibits tenderness. She exhibits no bony tenderness.  Tenderness upon palpation of paraspinal lumbar muscles,R>L.  Lymphadenopathy:    She has no cervical adenopathy.  Neurological: She is alert and oriented to person, place, and time. She has normal strength. Coordination and gait normal.  SLR negative bilateral. She is able to walk on tiptoes and heels with no limitation.   Skin: Skin is warm. No rash noted. No erythema.    Psychiatric: She has a normal mood and affect.  Well groomed, good eye contact.    Diabetic Foot Exam - Simple   Simple Foot Form Diabetic Foot exam was performed with the following findings:  Yes 10/04/2016 11:48 AM  Visual Inspection No deformities, no ulcerations, no other skin breakdown bilaterally:  Yes Sensation Testing Intact to touch and monofilament testing bilaterally:  Yes Pulse Check Posterior Tibialis and Dorsalis pulse intact bilaterally:  Yes Comments      ASSESSMENT AND PLAN:   Ms. Shonnie was seen today for follow-up.  Diagnoses and all orders for this visit: Lab Results  Component Value Date   HGBA1C 5.9 10/04/2016   Lab Results  Component Value Date   MICROALBUR <0.7 10/04/2016    Type 2 diabetes mellitus without complication, without long-term current use of insulin ([place])  HgA1C at goal. She can try non pharmacologic treatment, stop Metformin.She agrees with  plan, will continue monitoring BS's. Regular exercise and healthy diet with avoidance of added sugar food intake is an important part of treatment and recommended. Annual eye exam, periodic dental and foot care recommended. F/U in 4 months  -     POCT glycosylated hemoglobin (Hb A1C) -     Microalbumin / creatinine urine ratio  Generalized osteoarthritis of multiple sites  Stable. Side effects of NSAID's. Wt loss may help. Low impact exercise.  Numbness of left anterior thigh  ? Neuropathy,radicular pain. Did not improve with oral prednisone and now having symptoms on right LE. Instructed about warning signs. Lumbar MRI will be arranged.  -     MR Lumbar Spine Wo Contrast; Future  Insomnia, unspecified type  Good sleep hygiene. She wants to try Doxepin again, same dose. Decrease fluid intake in the afternoon and avoid after 6-7 pm.  Chronic bilateral low back pain with sciatica, sciatica laterality unspecified  Local heat. Mobic daily as needed. OTC Icy Hot patches or topical  may help. Wt loss may also help.  -     MR Lumbar Spine Wo Contrast; Future  Hypertension, essential, benign  Adequately controlled. Because HCTZ may be aggravating urination at night, decreased from 25 mg to 12.5 mg and we may consider to discontinue if problem continues. Lisinopril increased from 5 mg to 10 mg. Recommend monitoring BP. DASH diet recommended. Eye exam up to date.  Echo 05/2016 LVEF 10-25% and diastolic dysfunction grade I. F/U in 4 months, before if needed.  -     hydrochlorothiazide (HYDRODIURIL) 25 MG tablet; Take 0.5 tablets (12.5 mg total) by mouth daily. -     lisinopril (PRINIVIL,ZESTRIL) 5 MG tablet; Take 2 tablets (10 mg total) by mouth daily.      -Ms. [name] was advised to return sooner than planned today if new concerns arise.       [name]. [name], MD  [facility]. [place] office.

## 2016-10-04 ENCOUNTER — Encounter: Payer: Self-pay | Admitting: Family Medicine

## 2016-10-04 ENCOUNTER — Ambulatory Visit (INDEPENDENT_AMBULATORY_CARE_PROVIDER_SITE_OTHER): Payer: Medicare Other | Admitting: Family Medicine

## 2016-10-04 VITALS — BP 128/80 | HR 77 | Resp 12 | Ht 64.0 in | Wt 196.5 lb

## 2016-10-04 DIAGNOSIS — E119 Type 2 diabetes mellitus without complications: Secondary | ICD-10-CM | POA: Diagnosis not present

## 2016-10-04 DIAGNOSIS — M545 Low back pain, unspecified: Secondary | ICD-10-CM | POA: Insufficient documentation

## 2016-10-04 DIAGNOSIS — G4700 Insomnia, unspecified: Secondary | ICD-10-CM | POA: Diagnosis not present

## 2016-10-04 DIAGNOSIS — R200 Anesthesia of skin: Secondary | ICD-10-CM

## 2016-10-04 DIAGNOSIS — I10 Essential (primary) hypertension: Secondary | ICD-10-CM

## 2016-10-04 DIAGNOSIS — G8929 Other chronic pain: Secondary | ICD-10-CM

## 2016-10-04 DIAGNOSIS — M159 Polyosteoarthritis, unspecified: Secondary | ICD-10-CM | POA: Diagnosis not present

## 2016-10-04 DIAGNOSIS — M5440 Lumbago with sciatica, unspecified side: Secondary | ICD-10-CM | POA: Diagnosis not present

## 2016-10-04 LAB — MICROALBUMIN / CREATININE URINE RATIO
Creatinine,U: 156.6 mg/dL
Microalb Creat Ratio: 0.4 mg/g (ref 0.0–30.0)

## 2016-10-04 LAB — POCT GLYCOSYLATED HEMOGLOBIN (HGB A1C): HEMOGLOBIN A1C: 5.9

## 2016-10-04 MED ORDER — HYDROCHLOROTHIAZIDE 25 MG PO TABS: 12.5000 mg | ORAL_TABLET | Freq: Every day | ORAL | 1 refills | Status: DC

## 2016-10-04 MED ORDER — LISINOPRIL 5 MG PO TABS: 10.0000 mg | ORAL_TABLET | Freq: Every day | ORAL | 1 refills | Status: DC

## 2016-10-04 NOTE — Patient Instructions (Addendum)
A few things to remember from today's visit:   Type 2 diabetes mellitus without complication, without long-term current use of insulin ([place]) - Plan: POCT glycosylated hemoglobin (Hb A1C), Microalbumin / creatinine urine ratio  Generalized osteoarthritis of multiple sites  Numbness of left anterior thigh - Plan: MR Lumbar Spine Wo Contrast  Insomnia, unspecified type Today today hydrochlorothiazide decreased to 12.5 mg, lisinopril increased to 10 mg. Continue meloxicam as needed for pain. Avoid prolonged standing. Back MRI will be arranged  Keep appointment with gastroenterologist. Arrange appointment with [facility]. -We'll see her back in 3-4 months.  No changes on doxepin for now.  A1C 5.9, so I think you can continue with non pharmacologic treatment.  Diabetes Mellitus and Food It is important for you to manage your blood sugar (glucose) level. Your blood glucose level can be greatly affected by what you eat. Eating healthier foods in the appropriate amounts throughout the day at about the same time each day will help you control your blood glucose level. It can also help slow or prevent worsening of your diabetes mellitus. Healthy eating may even help you improve the level of your blood pressure and reach or maintain a healthy weight. General recommendations for healthful eating and cooking habits include:  Eating meals and snacks regularly. Avoid going long periods of time without eating to lose weight.  Eating a diet that consists mainly of plant-based foods, such as fruits, vegetables, nuts, legumes, and whole grains.  Using low-heat cooking methods, such as baking, instead of high-heat cooking methods, such as deep frying.  Work with your dietitian to make sure you understand how to use the Nutrition Facts information on food labels. How can food affect me? Carbohydrates Carbohydrates affect your blood glucose level more than any other type of food. Your  dietitian will help you determine how many carbohydrates to eat at each meal and teach you how to count carbohydrates. Counting carbohydrates is important to keep your blood glucose at a healthy level, especially if you are using insulin or taking certain medicines for diabetes mellitus. Alcohol Alcohol can cause sudden decreases in blood glucose (hypoglycemia), especially if you use insulin or take certain medicines for diabetes mellitus. Hypoglycemia can be a life-threatening condition. Symptoms of hypoglycemia (sleepiness, dizziness, and disorientation) are similar to symptoms of having too much alcohol. If your health care provider has given you approval to drink alcohol, do so in moderation and use the following guidelines:  Women should not have more than one drink per day, and men should not have more than two drinks per day. One drink is equal to: ? 12 oz of beer. ? 5 oz of wine. ? 1 oz of hard liquor.  Do not drink on an empty stomach.  Keep yourself hydrated. Have water, diet soda, or unsweetened iced tea.  Regular soda, juice, and other mixers might contain a lot of carbohydrates and should be counted.  What foods are not recommended? As you make food choices, it is important to remember that all foods are not the same. Some foods have fewer nutrients per serving than other foods, even though they might have the same number of calories or carbohydrates. It is difficult to get your body what it needs when you eat foods with fewer nutrients. Examples of foods that you should avoid that are high in calories and carbohydrates but low in nutrients include:  Trans fats (most processed foods list trans fats on the Nutrition Facts label).  Regular soda.  Juice.  Candy.  Sweets, such as cake, pie, doughnuts, and cookies.  Fried foods.  What foods can I eat? Eat nutrient-rich foods, which will nourish your body and keep you healthy. The food you should eat also will depend on  several factors, including:  The calories you need.  The medicines you take.  Your weight.  Your blood glucose level.  Your blood pressure level.  Your cholesterol level.  You should eat a variety of foods, including:  Protein. ? Lean cuts of meat. ? Proteins low in saturated fats, such as fish, egg whites, and beans. Avoid processed meats.  Fruits and vegetables. ? Fruits and vegetables that may help control blood glucose levels, such as apples, mangoes, and yams.  Dairy products. ? Choose fat-free or low-fat dairy products, such as milk, yogurt, and cheese.  Grains, bread, pasta, and rice. ? Choose whole grain products, such as multigrain bread, whole oats, and brown rice. These foods may help control blood pressure.  Fats. ? Foods containing healthful fats, such as nuts, avocado, olive oil, canola oil, and fish.  Does everyone with diabetes mellitus have the same meal plan? Because every person with diabetes mellitus is different, there is not one meal plan that works for everyone. It is very important that you meet with a dietitian who will help you create a meal plan that is just right for you. This information is not intended to replace advice given to you by your health care provider. Make sure you discuss any questions you have with your health care provider. Document Released: 10/07/2004 Document Revised: 06/18/2015 Document Reviewed: 12/07/2012 Elsevier Interactive Patient Education  2017 [employer].   Please be sure medication list is accurate. If a new problem present, please set up appointment sooner than planned today.

## 2016-10-13 DIAGNOSIS — H25013 Cortical age-related cataract, bilateral: Secondary | ICD-10-CM | POA: Diagnosis not present

## 2016-10-13 DIAGNOSIS — H2513 Age-related nuclear cataract, bilateral: Secondary | ICD-10-CM | POA: Diagnosis not present

## 2016-10-13 DIAGNOSIS — H524 Presbyopia: Secondary | ICD-10-CM | POA: Diagnosis not present

## 2016-10-13 LAB — HM DIABETES EYE EXAM

## 2016-10-15 ENCOUNTER — Other Ambulatory Visit: Payer: Medicare Other

## 2016-10-18 ENCOUNTER — Other Ambulatory Visit: Payer: Self-pay | Admitting: Sports Medicine

## 2016-10-18 ENCOUNTER — Ambulatory Visit (INDEPENDENT_AMBULATORY_CARE_PROVIDER_SITE_OTHER): Payer: Medicare Other

## 2016-10-18 ENCOUNTER — Ambulatory Visit (INDEPENDENT_AMBULATORY_CARE_PROVIDER_SITE_OTHER): Payer: Medicare Other | Admitting: Sports Medicine

## 2016-10-18 ENCOUNTER — Encounter: Payer: Self-pay | Admitting: Sports Medicine

## 2016-10-18 VITALS — BP 158/81 | HR 67 | Resp 16 | Ht 64.0 in | Wt 196.0 lb

## 2016-10-18 DIAGNOSIS — M79672 Pain in left foot: Secondary | ICD-10-CM

## 2016-10-18 DIAGNOSIS — E119 Type 2 diabetes mellitus without complications: Secondary | ICD-10-CM

## 2016-10-18 NOTE — Progress Notes (Signed)
Subjective: Kelsey Conway is a 71 y.o. female patient with history of diabetes who presents for diabetic foot check, states that she moved last year from [place] and is establishing care. Admits occassionally gets big toe joint pain on the left. Had surgery in 2016 but is able to walk and stand with no issues. States pain is sharp and sporatic. Patient denies any new cramping, numbness, burning or tingling in the legs. No other issues.  ROS: All systems Negative   Patient Active Problem List   Diagnosis Date Noted  . Low back pain 10/04/2016  . Chest pain 05/10/2016  . Abnormal EKG 05/10/2016  . Murmur 05/10/2016  . Mixed hyperlipidemia 05/10/2016  . Hypertension, essential, benign 05/03/2016  . Insomnia 05/03/2016  . Diabetes mellitus, type II ([place]) 05/03/2016  . Generalized osteoarthritis of multiple sites 05/03/2016  . Gastric mass 11/05/2014   Current Outpatient Prescriptions on File Prior to Visit  Medication Sig Dispense Refill  . atorvastatin (LIPITOR) 20 MG tablet Take 1 tablet (20 mg total) by mouth daily. 90 tablet 1  . doxepin (SINEQUAN) 10 MG capsule TAKE ONE CAPSULE BY MOUTH 30 TO 45 MINUTES BEFORE BEDTIME 90 capsule 0  . glucose blood (ACCU-CHEK AVIVA) test strip Use as instructed 100 each 12  . hydrochlorothiazide (HYDRODIURIL) 25 MG tablet Take 0.5 tablets (12.5 mg total) by mouth daily. 90 tablet 1  . Lancets 30G MISC TEST BLOOD SUGAR AS DIRECTED 1 each 5  . latanoprost (XALATAN) 0.005 % ophthalmic solution Place 1 drop into both eyes at bedtime.     Marland Kitchen lisinopril (PRINIVIL,ZESTRIL) 5 MG tablet Take 2 tablets (10 mg total) by mouth daily. 90 tablet 1   Current Facility-Administered Medications on File Prior to Visit  Medication Dose Route Frequency Provider Last Rate Last Dose  . nitroGLYCERIN (NITROSTAT) SL tablet 0.3 mg  0.3 mg Sublingual Q5 min PRN Tenna Delaine D, PA-C   0.3 mg at 04/21/16 1320   Allergies  Allergen Reactions  . Latex Rash    Powder latex     Recent Results (from the past 2160 hour(s))  Basic metabolic panel     Status: None   Collection Time: 09/30/16  3:34 PM  Result Value Ref Range   Sodium 142 135 - 145 mEq/L   Potassium 3.6 3.5 - 5.1 mEq/L   Chloride 106 96 - 112 mEq/L   CO2 29 19 - 32 mEq/L   Glucose, Bld 98 70 - 99 mg/dL   BUN 11 6 - 23 mg/dL   Creatinine, Ser 0.74 0.40 - 1.20 mg/dL   Calcium 9.5 8.4 - 10.5 mg/dL   GFR 99.90 >60.00 mL/min  POCT glycosylated hemoglobin (Hb A1C)     Status: None   Collection Time: 10/04/16 11:49 AM  Result Value Ref Range   Hemoglobin A1C 5.9   Microalbumin / creatinine urine ratio     Status: None   Collection Time: 10/04/16 11:58 AM  Result Value Ref Range   Microalb, Ur <0.7 0.0 - 1.9 mg/dL   Creatinine,U 156.6 mg/dL   Microalb Creat Ratio 0.4 0.0 - 30.0 mg/g    Objective: General: Patient is awake, alert, and oriented x 3 and in no acute distress.  Integument: Skin is warm, dry and supple bilateral. Nails are short, thickened and covered with nail polish. No signs of infection. No open lesions or preulcerative lesions present bilateral. Old scar at 1st MTPJ well healed. Remaining integument unremarkable.  Vasculature:  Dorsalis Pedis pulse 2/4 bilateral. Posterior  Tibial pulse  1/4 bilateral.  Capillary fill time <3 sec 1-5 bilateral. Positive hair growth to the level of the digits. Temperature gradient within normal limits. No varicosities present bilateral. No edema present bilateral.   Neurology: The patient has intact sensation measured with a 5.07/10g Semmes Weinstein Monofilament at all pedal sites bilateral. Vibratory sensation intact bilateral with tuning fork. No Babinski sign present bilateral.   Musculoskeletal: No symptomatic pedal deformities noted bilateral. Fixed hallux extensus on left. Muscular strength 5/5 in all lower extremity muscular groups bilateral without pain on range of motion. No tenderness with calf compression bilateral.  Xray left foot-  Surgical repair with filler at 1st met head and 1st MTPJ arthritis with extensus of hallux.   Assessment and Plan: Problem List Items Addressed This Visit    None    Visit Diagnoses    Foot pain, left    -  Primary   Relevant Orders   DG Foot Complete Left     -Examined patient. -Discussed and educated patient on diabetic foot care, especially with  regards to the vascular, neurological and musculoskeletal systems.  -Stressed the importance of good glycemic control and the detriment of not  controlling glucose levels in relation to the foot. -Will monitor left foot and recommend if needed OTC topical pain creams -Answered all patient questions -Patient to return  in 3 months for Diabetic nail trim -Patient advised to call the office if any problems or questions arise in the meantime.  Landis Martins, DPM

## 2016-10-18 NOTE — Progress Notes (Signed)
Subjective:    Patient ID: Kelsey Conway, female    DOB: April 20, 1945, 71 y.o.   MRN: [id number]  HPI    Review of Systems  All other systems reviewed and are negative.      Objective:   Physical Exam        Assessment & Plan:

## 2016-10-18 NOTE — Patient Instructions (Signed)

## 2016-10-21 ENCOUNTER — Ambulatory Visit
Admission: RE | Admit: 2016-10-21 | Discharge: 2016-10-21 | Disposition: A | Discharge status: Home / Self Care | Payer: Medicare Other | Source: Ambulatory Visit | Attending: Family Medicine | Admitting: Family Medicine

## 2016-10-21 DIAGNOSIS — M5440 Lumbago with sciatica, unspecified side: Secondary | ICD-10-CM

## 2016-10-21 DIAGNOSIS — G8929 Other chronic pain: Secondary | ICD-10-CM

## 2016-10-21 DIAGNOSIS — M48061 Spinal stenosis, lumbar region without neurogenic claudication: Secondary | ICD-10-CM | POA: Diagnosis not present

## 2016-10-21 DIAGNOSIS — R200 Anesthesia of skin: Secondary | ICD-10-CM

## 2016-10-25 ENCOUNTER — Telehealth: Payer: Self-pay | Admitting: *Deleted

## 2016-10-25 DIAGNOSIS — M5126 Other intervertebral disc displacement, lumbar region: Secondary | ICD-10-CM

## 2016-10-25 DIAGNOSIS — M5136 Other intervertebral disc degeneration, lumbar region: Secondary | ICD-10-CM

## 2016-10-25 NOTE — Telephone Encounter (Signed)
I called and spoke with patient. She is okay with doing the referral to ortho since her symptoms are ongoing. Referral has been entered and patient is aware that they will contact her to schedule her appointment.

## 2016-10-25 NOTE — Telephone Encounter (Signed)
Patient called stating she is returning Sarah's call.

## 2016-11-29 ENCOUNTER — Ambulatory Visit (INDEPENDENT_AMBULATORY_CARE_PROVIDER_SITE_OTHER): Payer: Medicare Other | Admitting: Orthopaedic Surgery

## 2016-11-29 ENCOUNTER — Encounter (INDEPENDENT_AMBULATORY_CARE_PROVIDER_SITE_OTHER): Payer: Self-pay | Admitting: Orthopaedic Surgery

## 2016-11-29 VITALS — BP 167/86 | HR 68 | Ht 64.0 in | Wt 193.0 lb

## 2016-11-29 DIAGNOSIS — M5136 Other intervertebral disc degeneration, lumbar region: Secondary | ICD-10-CM
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
## 2016-12-05 ENCOUNTER — Encounter (INDEPENDENT_AMBULATORY_CARE_PROVIDER_SITE_OTHER): Payer: Self-pay | Admitting: Orthopaedic Surgery

## 2016-12-22 ENCOUNTER — Other Ambulatory Visit: Payer: Self-pay

## 2016-12-22 NOTE — Patient Outreach (Signed)
[facility]) Care Management  12/22/2016  Kelsey Conway January 28, 1945 [id number]   Medication Adherence call to [name] patient is showing past due under [facility] Ins.on Metformin 500 mg spoke with patient she said she is no longer taking this medication doctor [name] took her off two month ago.   [facility] Management Direct Dial [phone]  Fax [phone] [email or web address]

## 2017-01-03 ENCOUNTER — Ambulatory Visit: Payer: Medicare Other | Admitting: Family Medicine

## 2017-01-13 ENCOUNTER — Ambulatory Visit: Payer: Medicare Other | Admitting: Family Medicine

## 2017-01-17 ENCOUNTER — Other Ambulatory Visit: Payer: Self-pay | Admitting: Family Medicine

## 2017-01-29 NOTE — Progress Notes (Signed)
HPI:   [name] is a 72 y.o. female, who is here today for 4 months follow up.   She was last seen on 10/04/16  Since her last OV she has seen Dr Dutch Gray.   Diabetes Mellitus II:   Since 2016. Currently on nonpharmacologic treatment.  Checking BS's : Good" 110-120 FG and bedtime. Hypoglycemia:None.   She denies abdominal pain, nausea, vomiting, polydipsia, polyuria, or polyphagia. No numbness, tingling, or burning.    Lab Results  Component Value Date   CREATININE 0.74 09/30/2016   BUN 11 09/30/2016   NA 142 09/30/2016   K 3.6 09/30/2016   CL 106 09/30/2016   CO2 29 09/30/2016    Lab Results  Component Value Date   HGBA1C 5.9 10/04/2016   Lab Results  Component Value Date   MICROALBUR <0.7 10/04/2016   HTN:  On HCTZ 25 mg 1/2 tab and Lisinopril 10 mg 1/2 tab daily. Denies severe/frequent headache, visual changes, chest pain, dyspnea, palpitation, claudication, focal weakness, or edema.  BP readings at home: 120-130's/70's.  Last eye exam: 3 months ago,appt tomorrow.   Insomnia:  Currently she is on Doxepin 10 mg at bedtime, did not helped, took it for 2 days. Melatonin did not help.  Sleeping about 5 hours.  She frequently sleeps with the TV on. She has trouble falling asleep and staying asleep.  HLD: She is on Atorvastatin 20 mg daily. She is tolerating medication well. She is also following low-fat diet.  Lab Results  Component Value Date   CHOL 163 10/28/2015   HDL 71 (A) 10/28/2015   LDLCALC 72 10/28/2015   TRIG 100 10/28/2015     Review of Systems  Constitutional: Negative for activity change, appetite change, fatigue, fever and unexpected weight change.  HENT: Negative for mouth sores, nosebleeds and trouble swallowing.   Eyes: Negative for redness and visual disturbance.  Respiratory: Negative for cough, shortness of breath and wheezing.   Cardiovascular: Negative for chest pain, palpitations and leg swelling.   Gastrointestinal: Negative for abdominal pain, nausea and vomiting.       Negative for changes in bowel habits.  Endocrine: Negative for cold intolerance, heat intolerance, polydipsia, polyphagia and polyuria.  Genitourinary: Negative for decreased urine volume and hematuria.  Musculoskeletal: Negative for gait problem and myalgias.  Skin: Negative for rash and wound.  Neurological: Negative for syncope, weakness, numbness and headaches.  Psychiatric/Behavioral: Positive for sleep disturbance. Negative for confusion. The patient is not nervous/anxious.      Current Outpatient Medications on File Prior to Visit  Medication Sig Dispense Refill  . atorvastatin (LIPITOR) 20 MG tablet Take 1 tablet (20 mg total) by mouth daily. (Patient taking differently: Take 20 mg by mouth at bedtime. ) 90 tablet 1  . glucose blood (ACCU-CHEK AVIVA) test strip Use as instructed 100 each 12  . hydrochlorothiazide (HYDRODIURIL) 25 MG tablet Take 0.5 tablets (12.5 mg total) by mouth daily. 90 tablet 1  . Lancets 30G MISC TEST BLOOD SUGAR AS DIRECTED 1 each 5  . lisinopril (PRINIVIL,ZESTRIL) 5 MG tablet Take 2 tablets (10 mg total) by mouth daily. 90 tablet 1   No current facility-administered medications on file prior to visit.      Past Medical History:  Diagnosis Date  . Arthritis   . Chicken pox   . Diabetes mellitus without complication ([place])   . Family history of polyps in the colon   . Frequent headaches   . Glaucoma   .  Heart murmur   . Hypertension    Allergies  Allergen Reactions  . Latex Rash    Powder latex    Social History   Socioeconomic History  . Marital status: Married    Spouse name: None  . Number of children: None  . Years of education: None  . Highest education level: None  Social Needs  . Financial resource strain: None  . Food insecurity - worry: None  . Food insecurity - inability: None  . Transportation needs - medical: None  . Transportation needs -  non-medical: None  Occupational History  . None  Tobacco Use  . Smoking status: Never Smoker  . Smokeless tobacco: Never Used  Substance and Sexual Activity  . Alcohol use: No  . Drug use: No  . Sexual activity: None  Other Topics Concern  . None  Social History Narrative  . None    Vitals:   01/30/17 0946  BP: 126/74  Pulse: 66  Resp: 12  Temp: 98.5 F (36.9 C)  SpO2: 97%   Body mass index is 34.52 kg/m.   Physical Exam  Nursing note and vitals reviewed. Constitutional: She is oriented to person, place, and time. She appears well-developed. No distress.  HENT:  Head: Normocephalic and atraumatic.  Mouth/Throat: Oropharynx is clear and moist and mucous membranes are normal.  Eyes: Conjunctivae are normal. Pupils are equal, round, and reactive to light.  Cardiovascular: Normal rate and regular rhythm.  No murmur heard. Pulses:      Dorsalis pedis pulses are 2+ on the right side, and 2+ on the left side.  Respiratory: Effort normal and breath sounds normal. No respiratory distress.  GI: Soft. She exhibits no mass. There is no hepatomegaly. There is no tenderness.  Musculoskeletal: She exhibits no edema or tenderness.  Lymphadenopathy:    She has no cervical adenopathy.  Neurological: She is alert and oriented to person, place, and time. She has normal strength. Gait normal.  Skin: Skin is warm. No rash noted. No erythema.  Psychiatric: She has a normal mood and affect.  Well groomed, good eye contact.   Foot exam on 10/04/2016   ASSESSMENT AND PLAN:   [name] was seen today for 4 months follow-up.   [name] was seen today for follow-up.  Diagnoses and all orders for this visit:  Lab Results  Component Value Date   HGBA1C 6.5 01/30/2017   Lab Results  Component Value Date   CREATININE 0.82 02/03/2017   BUN 8 02/03/2017   NA 141 02/03/2017   K 4.0 02/03/2017   CL 104 02/03/2017   CO2 30 02/03/2017   Lab Results  Component Value Date    CHOL 132 01/30/2017   HDL 53.30 01/30/2017   LDLCALC 60 01/30/2017   TRIG 93.0 01/30/2017   CHOLHDL 2 01/30/2017    Type 2 diabetes mellitus without complication, without long-term current use of insulin (HCC)  HgA1C at goal. No changes in current management. Regular exercise and healthy diet with avoidance of added sugar food intake is an important part of treatment and recommended. Annual eye exam, periodic dental and foot care recommended. F/U in 5-6 months  -     Hemoglobin A1c -     Fructosamine  Hypertension, essential, benign  Adequately controlled. No changes in current management. DASH diet recommended. Eye exam recommended annually. F/U in 6 months, before if needed.  -     Basic metabolic panel  Insomnia, unspecified type  Sleep  hygiene discussed, recommended. She agrees with trying Doxepin again, increased from 10 to 20 mg. Some side effects discussed. She will let me know if medication is helping.  Mixed hyperlipidemia  No changes in current management, will follow labs done today and will give further recommendations accordingly. Follow-up in 6-12 months.  -     Lipid panel      -[name] was advised to return sooner than planned today if new concerns arise.       Jaran Sainz G. [name], MD  [facility]. [place] office.

## 2017-01-30 ENCOUNTER — Encounter: Payer: Self-pay | Admitting: Family Medicine

## 2017-01-30 ENCOUNTER — Ambulatory Visit (INDEPENDENT_AMBULATORY_CARE_PROVIDER_SITE_OTHER): Payer: Medicare Other | Admitting: Family Medicine

## 2017-01-30 VITALS — BP 126/74 | HR 66 | Temp 98.5°F | Resp 12 | Ht 64.0 in | Wt 201.1 lb

## 2017-01-30 DIAGNOSIS — E119 Type 2 diabetes mellitus without complications: Secondary | ICD-10-CM | POA: Diagnosis not present

## 2017-01-30 DIAGNOSIS — E782 Mixed hyperlipidemia: Secondary | ICD-10-CM | POA: Diagnosis not present

## 2017-01-30 DIAGNOSIS — I10 Essential (primary) hypertension: Secondary | ICD-10-CM | POA: Diagnosis not present

## 2017-01-30 DIAGNOSIS — G4700 Insomnia, unspecified: Secondary | ICD-10-CM

## 2017-01-30 LAB — LIPID PANEL
Cholesterol: 132 mg/dL (ref 0–200)
HDL: 53.3 mg/dL (ref >39.00)
LDL Cholesterol: 60 mg/dL (ref 0–99)
NONHDL: 78.44
Total CHOL/HDL Ratio: 2
Triglycerides: 93 mg/dL (ref 0.0–149.0)
VLDL: 18.6 mg/dL (ref 0.0–40.0)

## 2017-01-30 LAB — BASIC METABOLIC PANEL
BUN: 13 mg/dL (ref 6–23)
CALCIUM: 9.4 mg/dL (ref 8.4–10.5)
CO2: 31 mEq/L (ref 19–32)
Chloride: 102 mEq/L (ref 96–112)
Creatinine, Ser: 0.77 mg/dL (ref 0.40–1.20)
GFR: 95.33 mL/min (ref >60.00)
GLUCOSE: 103 mg/dL — AB (ref 70–99)
Potassium: 3.7 mEq/L (ref 3.5–5.1)
SODIUM: 141 meq/L (ref 135–145)

## 2017-01-30 LAB — HEMOGLOBIN A1C: Hgb A1c MFr Bld: 6.5 % (ref 4.6–6.5)

## 2017-01-30 NOTE — Patient Instructions (Signed)
A few things to remember from today's visit:   Type 2 diabetes mellitus without complication, without long-term current use of insulin (HCC)  Hypertension, essential, benign  Insomnia, unspecified type  Mixed hyperlipidemia  Tried doxepin again but increase dose to 2 capsules.  Very important a good sleep hygiene.    Please be sure medication list is accurate. If a new problem present, please set up appointment sooner than planned today.

## 2017-01-31 ENCOUNTER — Ambulatory Visit: Payer: Medicare Other | Admitting: Sports Medicine

## 2017-01-31 LAB — FRUCTOSAMINE: Fructosamine: 201 umol/L (ref 190–270)

## 2017-02-03 ENCOUNTER — Emergency Department (HOSPITAL_COMMUNITY)
Admission: EM | Admit: 2017-02-03 | Discharge: 2017-02-03 | Disposition: A | Discharge status: Home / Self Care | Payer: Medicare Other | Attending: Emergency Medicine | Admitting: Emergency Medicine

## 2017-02-03 ENCOUNTER — Encounter (HOSPITAL_COMMUNITY): Payer: Self-pay | Admitting: Emergency Medicine

## 2017-02-03 ENCOUNTER — Ambulatory Visit: Payer: Self-pay

## 2017-02-03 ENCOUNTER — Other Ambulatory Visit: Payer: Self-pay

## 2017-02-03 ENCOUNTER — Emergency Department (HOSPITAL_COMMUNITY): Payer: Medicare Other

## 2017-02-03 DIAGNOSIS — N898 Other specified noninflammatory disorders of vagina: Secondary | ICD-10-CM

## 2017-02-03 DIAGNOSIS — I10 Essential (primary) hypertension: Secondary | ICD-10-CM | POA: Diagnosis not present

## 2017-02-03 DIAGNOSIS — Z91040 Latex allergy status: Secondary | ICD-10-CM | POA: Insufficient documentation

## 2017-02-03 DIAGNOSIS — R109 Unspecified abdominal pain: Secondary | ICD-10-CM | POA: Diagnosis not present

## 2017-02-03 DIAGNOSIS — N899 Noninflammatory disorder of vagina, unspecified: Secondary | ICD-10-CM | POA: Insufficient documentation

## 2017-02-03 DIAGNOSIS — N390 Urinary tract infection, site not specified: Secondary | ICD-10-CM | POA: Diagnosis not present

## 2017-02-03 DIAGNOSIS — R1030 Lower abdominal pain, unspecified: Secondary | ICD-10-CM | POA: Diagnosis not present

## 2017-02-03 DIAGNOSIS — E119 Type 2 diabetes mellitus without complications: Secondary | ICD-10-CM | POA: Diagnosis not present

## 2017-02-03 DIAGNOSIS — Z79899 Other long term (current) drug therapy: Secondary | ICD-10-CM | POA: Insufficient documentation

## 2017-02-03 LAB — URINALYSIS, ROUTINE W REFLEX MICROSCOPIC
BILIRUBIN URINE: NEGATIVE
Glucose, UA: NEGATIVE mg/dL
Ketones, ur: NEGATIVE mg/dL
NITRITE: NEGATIVE
Protein, ur: NEGATIVE mg/dL
SPECIFIC GRAVITY, URINE: 1.01 (ref 1.005–1.030)
pH: 5 (ref 5.0–8.0)

## 2017-02-03 LAB — COMPREHENSIVE METABOLIC PANEL
ALK PHOS: 76 U/L (ref 38–126)
ALT: 24 U/L (ref 14–54)
AST: 21 U/L (ref 15–41)
Albumin: 3.6 g/dL (ref 3.5–5.0)
Anion gap: 7 (ref 5–15)
BILIRUBIN TOTAL: 0.8 mg/dL (ref 0.3–1.2)
BUN: 8 mg/dL (ref 6–20)
CO2: 30 mmol/L (ref 22–32)
CREATININE: 0.82 mg/dL (ref 0.44–1.00)
Calcium: 9.2 mg/dL (ref 8.9–10.3)
Chloride: 104 mmol/L (ref 101–111)
GFR calc Af Amer: >60 mL/min (ref >60)
GFR calc non Af Amer: >60 mL/min (ref >60)
Glucose, Bld: 106 mg/dL — ABNORMAL HIGH (ref 65–99)
Potassium: 4 mmol/L (ref 3.5–5.1)
Sodium: 141 mmol/L (ref 135–145)
TOTAL PROTEIN: 7 g/dL (ref 6.5–8.1)

## 2017-02-03 LAB — CBC WITH DIFFERENTIAL/PLATELET
Basophils Absolute: 0 10*3/uL (ref 0.0–0.1)
Basophils Relative: 0 %
EOS ABS: 0.1 10*3/uL (ref 0.0–0.7)
EOS PCT: 1 %
HCT: 40.9 % (ref 36.0–46.0)
Hemoglobin: 13 g/dL (ref 12.0–15.0)
LYMPHS ABS: 3.4 10*3/uL (ref 0.7–4.0)
Lymphocytes Relative: 40 %
MCH: 26.3 pg (ref 26.0–34.0)
MCHC: 31.8 g/dL (ref 30.0–36.0)
MCV: 82.8 fL (ref 78.0–100.0)
Monocytes Absolute: 0.4 10*3/uL (ref 0.1–1.0)
Monocytes Relative: 5 %
Neutro Abs: 4.7 10*3/uL (ref 1.7–7.7)
Neutrophils Relative %: 54 %
PLATELETS: 363 10*3/uL (ref 150–400)
RBC: 4.94 MIL/uL (ref 3.87–5.11)
RDW: 15 % (ref 11.5–15.5)
WBC: 8.6 10*3/uL (ref 4.0–10.5)

## 2017-02-03 LAB — WET PREP, GENITAL
Clue Cells Wet Prep HPF POC: NONE SEEN
Sperm: NONE SEEN
Trich, Wet Prep: NONE SEEN
YEAST WET PREP: NONE SEEN

## 2017-02-03 LAB — LIPASE, BLOOD: LIPASE: 31 U/L (ref 11–51)

## 2017-02-03 MED ORDER — TRAMADOL HCL 50 MG PO TABS: 50.0000 mg | ORAL_TABLET | Freq: Four times a day (QID) | ORAL | 0 refills | Status: DC | PRN

## 2017-02-03 MED ORDER — FENTANYL CITRATE (PF) 100 MCG/2ML IJ SOLN
25.0000 ug | Freq: Once | INTRAMUSCULAR | Status: AC
  Administered 2017-02-03: 25 ug via INTRAVENOUS
  Filled 2017-02-03: qty 2

## 2017-02-03 MED ORDER — CEPHALEXIN 500 MG PO CAPS: 500.0000 mg | ORAL_CAPSULE | Freq: Two times a day (BID) | ORAL | 0 refills | Status: DC

## 2017-02-03 MED ORDER — IOPAMIDOL (ISOVUE-300) INJECTION 61%
INTRAVENOUS | Status: AC
  Administered 2017-02-03: 100 mL
  Filled 2017-02-03: qty 100

## 2017-02-03 NOTE — Discharge Instructions (Signed)
You were seen in the emergency department for abdominal pain. Your labs did not show signs of anemia, electrolyte abnormality, or problems with your kidneys, liver, or pancreas.   Your urine showed findings concerning for a urinary tract infection. I have given you a prescription for Keflex- this is an antibiotic you will need to take once in the morning and once in the evening. Please take all of your antibiotics until finished. You may develop abdominal discomfort or diarrhea from the antibiotic.  You may help offset this with probiotics which you can buy at the store (ask your pharmacist if unable to find) or get probiotics in the form of eating yogurt. Do not eat or take the probiotics until 2 hours after your antibiotic. If you are unable to tolerate these side effects follow-up with your primary care provider or return to the emergency department.   If you begin to experience any blistering, rashes, swelling, or difficulty breathing seek medical care for evaluation of potentially more serious side effects.   Additionally an abnormal mass was found on your pelvic exam. You will need to follow up with an ob/gyn provider within the next 3-5 days for further evaluation of this.  I given you information for an OB/GYN provider in the area, however you may see any OB/GYN provider of your preference.  I have given you a prescription for Tramadol for pain- you may take this once every 6 hours as needed.   Return to the emergency department for any new or worsening symptoms including but not limited to worsening of your pain, inability to tolerate fluids, blood in your vomit or stool, or vaginal bleeding similar to a period or any other concerns.

## 2017-02-03 NOTE — ED Notes (Signed)
ED Provider at bedside. 

## 2017-02-03 NOTE — ED Notes (Signed)
Patient transported to CT 

## 2017-02-03 NOTE — ED Provider Notes (Signed)
[facility] EMERGENCY DEPARTMENT Provider Note   CSN: [id number] Arrival date & time: 02/03/17  1124    History  Chief Complaint Chief Complaint  Patient presents with  . Abdominal Pain  . Vaginal Discharge    HPI Kelsey Conway is a 72 y.o. female with a hx of DM, HTN, cholecystectomy, abdominal hysterectomy, and tubal ligation who presents to the ED for progressively worsening suprapubic pain x 3 days. Patient states pain was occurring intermittently for 2 days then today woke up and it seemed worse and has been constant. Describes the pain as being an ache to the diffuse suprapubic region radiates to the vaginal region. Has not tried any at home intervention. Pain seems to be worse with movement, no alleviating factors. Reports associated vaginal discharge that is brown mucous. No vaginal bleeding. Patient states she has had increased sexual activity recently. States hx of similar sxs> 20 years ago with some type of infection treated with abx and monostat. Denies fever, chills, N/V/D, blood in stool, or dysuria.   HPI  Past Medical History:  Diagnosis Date  . Arthritis   . Chicken pox   . Diabetes mellitus without complication ([place])   . Family history of polyps in the colon   . Frequent headaches   . Glaucoma   . Heart murmur   . Hypertension     Patient Active Problem List   Diagnosis Date Noted  . Low back pain 10/04/2016  . Chest pain 05/10/2016  . Abnormal EKG 05/10/2016  . Murmur 05/10/2016  . Mixed hyperlipidemia 05/10/2016  . Hypertension, essential, benign 05/03/2016  . Insomnia 05/03/2016  . Diabetes mellitus, type II ([place]) 05/03/2016  . Generalized osteoarthritis of multiple sites 05/03/2016  . Gastric mass 11/05/2014    Past Surgical History:  Procedure Laterality Date  . ABDOMINAL HYSTERECTOMY    . CHOLECYSTECTOMY    . TUBAL LIGATION    . 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 Authorizing Provider  atorvastatin (LIPITOR) 20 MG tablet Take 1 tablet (20 mg total) by mouth daily. 07/13/16   [name], [name], MD  doxepin (SINEQUAN) 10 MG capsule TAKE ONE CAPSULE BY MOUTH 30 TO 45 MINUTES BEFORE BEDTIME Patient not taking: Reported on 01/30/2017 08/03/16   [name], [name], MD  glucose blood (ACCU-CHEK AVIVA) test strip Use as instructed 08/18/16   [name], [name], MD  hydrochlorothiazide (HYDRODIURIL) 25 MG tablet Take 0.5 tablets (12.5 mg total) by mouth daily. 10/04/16   [name], [name], MD  Lancets 30G MISC TEST BLOOD SUGAR AS DIRECTED 08/18/16   [name], [name], MD  latanoprost (XALATAN) 0.005 % ophthalmic solution Place 1 drop into both eyes at bedtime.  01/27/16   [provider]  lisinopril (PRINIVIL,ZESTRIL) 5 MG tablet Take 2 tablets (10 mg total) by mouth daily. 10/04/16   [name], [name], MD  meloxicam [facility]) 7.5 MG tablet  11/02/16   [provider]    Family History Family History  Problem Relation Age of Onset  . Diabetes Mother   . Hypertension Mother   . Hyperlipidemia Mother   . Diabetes Sister   . Hypertension Sister   . Diabetes Brother     Social History Social History   Tobacco Use  . Smoking status: Never  Smoker  . Smokeless tobacco: Never Used  Substance Use Topics  . Alcohol use: No  . Drug use: No     Allergies   Latex   Review of Systems Review of Systems  Constitutional: Negative for chills and fever.  Respiratory: Negative for shortness of breath.   Cardiovascular: Negative for chest pain.  Gastrointestinal: Positive for abdominal pain. Negative for blood in stool, diarrhea, nausea and vomiting.  Genitourinary: Positive for vaginal discharge and vaginal pain. Negative for dysuria, frequency, urgency and vaginal bleeding.  All other systems reviewed and are negative.    Physical Exam Updated Vital Signs BP (!)  166/57   Pulse (!) 52   Temp 98.4 F (36.9 C) (Oral)   Resp 18   SpO2 99%   Physical Exam  Constitutional: She appears well-developed and well-nourished. No distress.  HENT:  Head: Normocephalic and atraumatic.  Eyes: Conjunctivae are normal. Right eye exhibits no discharge. Left eye exhibits no discharge.  Cardiovascular: Normal rate and regular rhythm.  No murmur heard. Pulmonary/Chest: Breath sounds normal. No respiratory distress. She has no wheezes. She has no rales.  Abdominal: Soft. She exhibits no distension. There is tenderness (mild suprapubic). There is no rigidity, no rebound, no guarding and no tenderness at McBurney's point.  Genitourinary: There is no lesion on the right labia. There is no lesion on the left labia. Right adnexum displays no mass and no tenderness. Left adnexum displays no mass and no tenderness. Vaginal discharge (Small amount white/brown colored. ) found.  Genitourinary Comments: There is an approximately 2 cm pedunculated freely mobile mass with its base adhered to the posterior vaginal wall. The lesion appears to be a dark red color with overlying mucous discharge. Photo below.   Technician Mickel Baas acted as Producer, television/film/video.   Neurological: She is alert.  Clear speech.   Skin: Skin is warm and dry. No rash noted.  Psychiatric: She has a normal mood and affect. Her behavior is normal.  Nursing note and vitals reviewed.      ED Treatments / Results  Labs Results for orders placed or performed during the hospital encounter of 02/03/17  Wet prep, genital  Result Value Ref Range   Yeast Wet Prep HPF POC NONE SEEN NONE SEEN   Trich, Wet Prep NONE SEEN NONE SEEN   Clue Cells Wet Prep HPF POC NONE SEEN NONE SEEN   WBC, Wet Prep HPF POC MANY (A) NONE SEEN   Sperm NONE SEEN   Urinalysis, Routine w reflex microscopic  Result Value Ref Range   Color, Urine YELLOW YELLOW   APPearance CLEAR CLEAR   Specific Gravity, Urine 1.010 1.005 - 1.030   pH 5.0 5.0 - 8.0    Glucose, UA NEGATIVE NEGATIVE mg/dL   Hgb urine dipstick SMALL (A) NEGATIVE   Bilirubin Urine NEGATIVE NEGATIVE   Ketones, ur NEGATIVE NEGATIVE mg/dL   Protein, ur NEGATIVE NEGATIVE mg/dL   Nitrite NEGATIVE NEGATIVE   Leukocytes, UA TRACE (A) NEGATIVE   RBC / HPF 0-5 0 - 5 RBC/hpf   WBC, UA 0-5 0 - 5 WBC/hpf   Bacteria, UA MANY (A) NONE SEEN   Squamous Epithelial / LPF 0-5 (A) NONE SEEN  Comprehensive metabolic panel  Result Value Ref Range   Sodium 141 135 - 145 mmol/L   Potassium 4.0 3.5 - 5.1 mmol/L   Chloride 104 101 - 111 mmol/L   CO2 30 22 - 32 mmol/L   Glucose, Bld 106 (H) 65 - 99 mg/dL  BUN 8 6 - 20 mg/dL   Creatinine, Ser 0.82 0.44 - 1.00 mg/dL   Calcium 9.2 8.9 - 10.3 mg/dL   Total Protein 7.0 6.5 - 8.1 g/dL   Albumin 3.6 3.5 - 5.0 g/dL   AST 21 15 - 41 U/L   ALT 24 14 - 54 U/L   Alkaline Phosphatase 76 38 - 126 U/L   Total Bilirubin 0.8 0.3 - 1.2 mg/dL   GFR calc non Af Amer >60 >60 mL/min   GFR calc Af Amer >60 >60 mL/min   Anion gap 7 5 - 15  CBC with Differential  Result Value Ref Range   WBC 8.6 4.0 - 10.5 K/uL   RBC 4.94 3.87 - 5.11 MIL/uL   Hemoglobin 13.0 12.0 - 15.0 g/dL   HCT 40.9 36.0 - 46.0 %   MCV 82.8 78.0 - 100.0 fL   MCH 26.3 26.0 - 34.0 pg   MCHC 31.8 30.0 - 36.0 g/dL   RDW 15.0 11.5 - 15.5 %   Platelets 363 150 - 400 K/uL   Neutrophils Relative % 54 %   Neutro Abs 4.7 1.7 - 7.7 K/uL   Lymphocytes Relative 40 %   Lymphs Abs 3.4 0.7 - 4.0 K/uL   Monocytes Relative 5 %   Monocytes Absolute 0.4 0.1 - 1.0 K/uL   Eosinophils Relative 1 %   Eosinophils Absolute 0.1 0.0 - 0.7 K/uL   Basophils Relative 0 %   Basophils Absolute 0.0 0.0 - 0.1 K/uL  Lipase, blood  Result Value Ref Range   Lipase 31 11 - 51 U/L   EKG  EKG Interpretation None       Radiology Ct Abdomen Pelvis W Contrast  Result Date: 02/03/2017 CLINICAL DATA:  Lower abdominal pain with vaginal pain and malodorous discharge for the past 3 days. EXAM: CT ABDOMEN AND  PELVIS WITH CONTRAST TECHNIQUE: Multidetector CT imaging of the abdomen and pelvis was performed using the standard protocol following bolus administration of intravenous contrast. CONTRAST:  100 cc ISOVUE-300 IOPAMIDOL (ISOVUE-300) INJECTION 61% COMPARISON:  10/03/2016. FINDINGS: Lower chest: Mild bibasilar scarring without significant change. Hepatobiliary: Cholecystectomy clips.  Unremarkable liver. Pancreas: Unremarkable. No pancreatic ductal dilatation or surrounding inflammatory changes. Spleen: Normal in size without focal abnormality. Adrenals/Urinary Tract: 8 mm rounded low density in the mid left kidney with little change. This measures between 11 and -13 Hounsfield units in density. Small areas of cortical scarring in the right kidney. Unremarkable ureters and urinary bladder. Normal appearing adrenal glands. Stomach/Bowel: Surgical sutures are again demonstrated along the greater curvature of the proximal stomach. Unremarkable small bowel and colon. No evidence of appendicitis. Vascular/Lymphatic: Aortic atherosclerosis. No enlarged abdominal or pelvic lymph nodes. Reproductive: Post hysterectomy changes. Interval small fluid collection in the vaginal cuff to the right of midline, measuring 1.9 x 0.5 cm on image number 77 of series 3. This measures 1.7 cm in length on sagittal image number 60 and contains a tiny amount of gas. This is in close approximation with the rectum with no visible communication with the rectum. Other: Small umbilical hernia containing fat. Musculoskeletal: Lumbar and lower thoracic spine degenerative changes. Mild right hip degenerative changes. IMPRESSION: 1. Interval 1.9 x 1.7 x 0.5 cm fluid collection with a tiny amount of gas in the vaginal cuff. This could represent a small abscess. A rectovaginal fistula is a consideration. 2. No significant change in a small left renal cyst or angiomyolipoma. Electronically Signed   By: Percell Locus.D.  On: 02/03/2017 21:53    Procedures Procedures (including critical care time)  Medications Ordered in ED Medications  iopamidol (ISOVUE-300) 61 % injection (100 mLs  Contrast Given 02/03/17 2116)  fentaNYL (SUBLIMAZE) injection 25 mcg (25 mcg Intravenous Given 02/03/17 2221)  fentaNYL (SUBLIMAZE) injection 25 mcg (25 mcg Intravenous Given 02/03/17 2329)   Initial Impression / Assessment and Plan / ED Course  I have reviewed the triage vital signs and the nursing notes.  Pertinent labs & imaging results that were available during my care of the patient were reviewed by me and considered in my medical decision making (see chart for details).  Patient presents with suprapubic pain and vaginal discharge. She is nontoxic appearing and afebrile. On exam mild suprapubic tenderness. Pelvic exam revealed a 2cm pedunculated vaginal mass. This prompted further evaluation with screening labs and abdomen/pelvis CT. Screening labs grossly unremarkable, urine pending at this time. CT scan report with interval fluid collection with a tiny amount of gas in the vaginal cuff, could represent a small abscess, rectovaginal fistula considered per radiology read. Patient's physical exam findings not consistent with rectovaginal fistula, low suspicion for this. Given physical exam and CT findings will discuss case with OB/GYN.     22:29: CONSULT: Discussed findings and results with Dr. Roselie Awkward, OB/GYN, who recommends patient be discharged home with outpatient follow-up.   UA findings concerning for UTI with suprapubic discomfort- will treat with Keflex.  Patient does not meet the SIRS or Sepsis criteria.  On repeat exam patient does not have a surgical abdomen and there are no peritoneal signs.  No indication of appendicitis, bowel obstruction, bowel perforation, cholecystitis, ovarian torsion, or diverticulitis.   23:02: RE-EVAL: Patient's pain improving substantially, still somewhat present. I discussed results, treatment plan, need for  close obgyn follow-up, and return precautions with the patient and her husband. Provided opportunity for questions, patient and her husband confirmed understanding and are in agreement with plan.   Findings and plan of care discussed with supervising physician Dr. Lacinda Axon who personally evaluated and examined this patient, in agreement with plan.    Final Clinical Impressions(s) / ED Diagnoses   Final diagnoses:  Lower urinary tract infectious disease  Vaginal mass    ED Discharge Orders        Ordered    cephALEXin (KEFLEX) 500 MG capsule  2 times daily     02/03/17 2317    traMADol (ULTRAM) 50 MG tablet  Every 6 hours PRN     02/03/17 2317       Nixxon Faria, Glynda Jaeger, PA-C 02/04/17 0122    Nat Christen, MD 02/06/17 [phone]

## 2017-02-03 NOTE — Telephone Encounter (Signed)
Patient called in with c/o "lower abdominal pain." She describes the pain as located near her vagina. I asked when did the pain start and is radiating anywhere, she says "It started about 2 days ago and it gradually got worse today and the pain moves into my back." She says the pain is constant at 10 on pain scale. I asked how was her urination, she said "It is good, no burning, pain with urination." I asked about her bowels, she reports "no constipation and had a BM in the last couple of days." Denies fever or any other symptoms. According to protocol, go to ED, care advice given, verbalized understanding.   Reason for Disposition . [1] SEVERE pain AND [2] age > 29  Answer Assessment - Initial Assessment Questions 1. LOCATION: "Where does it hurt?"      Lower abdomen near vagina 2. RADIATION: "Does the pain shoot anywhere else?" (e.g., chest, back)     Back  3. ONSET: "When did the pain begin?" (e.g., minutes, hours or days ago)      2 days ago, worse today 4. SUDDEN: "Gradual or sudden onset?"     Gradually got worse 5. PATTERN "Does the pain come and go, or is it constant?"    - If constant: "Is it getting better, staying the same, or worsening?"      (Note: Constant means the pain never goes away completely; most serious pain is constant and it progresses)     - If intermittent: "How long does it last?" "Do you have pain now?"     (Note: Intermittent means the pain goes away completely between bouts)     Constant 6. SEVERITY: "How bad is the pain?"  (e.g., Scale 1-10; mild, moderate, or severe)   - MILD (1-3): doesn't interfere with normal activities, abdomen soft and not tender to touch    - MODERATE (4-7): interferes with normal activities or awakens from sleep, tender to touch    - SEVERE (8-10): excruciating pain, doubled over, unable to do any normal activities      10 7. RECURRENT SYMPTOM: "Have you ever had this type of abdominal pain before?" If so, ask: "When was the last  time?" and "What happened that time?"      Not in years. When I did have the pain, it was a bladder infection. 8. CAUSE: "What do you think is causing the abdominal pain?"     Unknown 9. RELIEVING/AGGRAVATING FACTORS: "What makes it better or worse?" (e.g., movement, antacids, bowel movement)     Movement 10. OTHER SYMPTOMS: "Has there been any vomiting, diarrhea, constipation, or urine problems?"       Denies 11. PREGNANCY: "Is there any chance you are pregnant?" "When was your last menstrual period?"       No  Protocols used: ABDOMINAL PAIN - [facility]

## 2017-02-03 NOTE — Telephone Encounter (Signed)
Monitor for ER arrival 

## 2017-02-03 NOTE — Telephone Encounter (Signed)
Confirmed patient has arrived in ER now.  

## 2017-02-03 NOTE — ED Triage Notes (Signed)
Pt reports lower abd pain and some vaginal pain with discharge with odor x 3 days.  Pt reports more frequent sexual intercourse than usual, denies new partners. Pt denies dysuria.

## 2017-02-04 LAB — GC/CHLAMYDIA PROBE AMP (~~LOC~~) NOT AT ARMC
CHLAMYDIA, DNA PROBE: NEGATIVE
NEISSERIA GONORRHEA: NEGATIVE

## 2017-02-04 NOTE — ED Notes (Addendum)
Wasted 75 mcg with Linda Hedges RN in sink

## 2017-02-05 LAB — URINE CULTURE

## 2017-02-09 DIAGNOSIS — I10 Essential (primary) hypertension: Secondary | ICD-10-CM | POA: Diagnosis not present

## 2017-02-09 DIAGNOSIS — R42 Dizziness and giddiness: Secondary | ICD-10-CM | POA: Diagnosis not present

## 2017-02-09 DIAGNOSIS — E119 Type 2 diabetes mellitus without complications: Secondary | ICD-10-CM | POA: Diagnosis not present

## 2017-03-01 ENCOUNTER — Encounter: Payer: Self-pay | Admitting: Family Medicine

## 2017-03-01 ENCOUNTER — Ambulatory Visit (INDEPENDENT_AMBULATORY_CARE_PROVIDER_SITE_OTHER): Payer: Medicare Other | Admitting: Family Medicine

## 2017-03-01 VITALS — BP 130/84 | HR 70 | Temp 98.8°F | Resp 12 | Ht 64.0 in | Wt 203.1 lb

## 2017-03-01 DIAGNOSIS — D4959 Neoplasm of unspecified behavior of other genitourinary organ: Secondary | ICD-10-CM | POA: Diagnosis not present

## 2017-03-01 DIAGNOSIS — L02412 Cutaneous abscess of left axilla: Secondary | ICD-10-CM

## 2017-03-01 DIAGNOSIS — L723 Sebaceous cyst: Secondary | ICD-10-CM | POA: Diagnosis not present

## 2017-03-01 MED ORDER — DOXYCYCLINE HYCLATE 100 MG PO TABS: 100.0000 mg | ORAL_TABLET | Freq: Two times a day (BID) | ORAL | 0 refills | Status: AC

## 2017-03-01 NOTE — Patient Instructions (Addendum)
A few things to remember from today's visit:   Abscess of axilla, left - Plan: doxycycline (VIBRA-TABS) 100 MG tablet  Sebaceous cyst   Epidermal Cyst An epidermal cyst is a small, painless lump under your skin. It may be called an epidermal inclusion cyst or an infundibular cyst. The cyst contains a grayish-white, bad-smelling substance (keratin). It is important not to pop epidermal cysts yourself. These cysts are usually harmless (benign), but they can get infected. Symptoms of infection may include:  Redness.  Inflammation.  Tenderness.  Warmth.  Fever.  A grayish-white, bad-smelling substance draining from the cyst.  Pus draining from the cyst.  Follow these instructions at home:  Take over-the-counter and prescription medicines only as told by your doctor.  If you were prescribed an antibiotic, use it as told by your doctor. Do not stop using the antibiotic even if you start to feel better.  Keep the area around your cyst clean and dry.  Wear loose, dry clothing.  Do not try to pop your cyst.  Avoid touching your cyst.  Check your cyst every day for signs of infection.  Keep all follow-up visits as told by your doctor. This is important. How is this prevented?  Wear clean, dry, clothing.  Avoid wearing tight clothing.  Keep your skin clean and dry. Shower or take baths every day.  Wash your body with a benzoyl peroxide wash when you shower or bathe. Contact a health care provider if:  Your cyst has symptoms of infection.  Your condition is not improving or is getting worse.  You have a cyst that looks different from other cysts you have had.  You have a fever. Get help right away if:  Redness spreads from the cyst into the surrounding area. This information is not intended to replace advice given to you by your health care provider. Make sure you discuss any questions you have with your health care provider. Document Released: 02/18/2004 Document  Revised: 09/09/2015 Document Reviewed: 11/12/2014 Elsevier Interactive Patient Education  2018 [employer].  Please be sure medication list is accurate. If a new problem present, please set up appointment sooner than planned today.

## 2017-03-01 NOTE — Progress Notes (Signed)
ACUTE VISIT   HPI:  Chief Complaint  Patient presents with  . Knot under left arm    noticed 2 months, hard, painful only when touched    [name] is a 72 y.o. female, who is here today complaining of 2 months of "knot" in left axilla. "hard lump",constant,and growing. No skin changes. Mildly "sore."  She has not tried OTC medication. She has not noted drainage, fever, chills. Niece was recently diagnosed with a breast mass, she wants to be sure this lesion is not related to breast pathology.  Last mammogram 09/2016, Bi-rads 1.  She has not noted breast masses, tenderness, changes, or nipple discharge.   She is also concerned about a vaginal wall "mass" found incidentally during a pelvic examination in the ED on 02/03/2017.She presented to the ER c/o pelvic pain, Dx with UTI.  She has an appointment with gynecologist on 03/08/17.  Pelvic pain resolved after antibiotic treatment. She denies any vaginal discharge or bleeding. S/P hysterectomy.  Review of Systems  Constitutional: Negative for appetite change, chills, fatigue and fever.  HENT: Negative for mouth sores and sore throat.   Respiratory: Negative for cough, shortness of breath and wheezing.   Cardiovascular: Negative for chest pain, palpitations and leg swelling.  Gastrointestinal: Negative for abdominal pain, nausea and vomiting.  Genitourinary: Negative for decreased urine volume, dyspareunia, dysuria, pelvic pain, vaginal bleeding, vaginal discharge and vaginal pain.  Musculoskeletal: Negative for back pain and myalgias.  Skin: Negative for rash and wound.  Neurological: Negative for weakness and numbness.  Hematological: Negative for adenopathy. Does not bruise/bleed easily.  Psychiatric/Behavioral: Negative for confusion. The patient is nervous/anxious.       Current Outpatient Medications on File Prior to Visit  Medication Sig Dispense Refill  . atorvastatin (LIPITOR) 20 MG tablet Take 1  tablet (20 mg total) by mouth daily. (Patient taking differently: Take 20 mg by mouth at bedtime. ) 90 tablet 1  . glucose blood (ACCU-CHEK AVIVA) test strip Use as instructed 100 each 12  . hydrochlorothiazide (HYDRODIURIL) 25 MG tablet Take 0.5 tablets (12.5 mg total) by mouth daily. 90 tablet 1  . Lancets 30G MISC TEST BLOOD SUGAR AS DIRECTED 1 each 5  . lisinopril (PRINIVIL,ZESTRIL) 5 MG tablet Take 2 tablets (10 mg total) by mouth daily. 90 tablet 1  . meclizine (ANTIVERT) 12.5 MG tablet TK 1 TO 2 TS PO TID PRN  0  . acetaminophen (TYLENOL) 500 MG tablet Take 1,000 mg by mouth every 6 (six) hours as needed for headache (pain).    . cephALEXin (KEFLEX) 500 MG capsule Take 1 capsule (500 mg total) by mouth 2 (two) times daily. (Patient not taking: Reported on 03/01/2017) 10 capsule 0  . traMADol (ULTRAM) 50 MG tablet Take 1 tablet (50 mg total) by mouth every 6 (six) hours as needed. (Patient not taking: Reported on 03/01/2017) 15 tablet 0   No current facility-administered medications on file prior to visit.      Past Medical History:  Diagnosis Date  . Arthritis   . Chicken pox   . Diabetes mellitus without complication ([place])   . Family history of polyps in the colon   . Frequent headaches   . Glaucoma   . Heart murmur   . Hypertension    Allergies  Allergen Reactions  . Latex Rash    Powder latex    Social History   Socioeconomic History  . Marital status: Married    Spouse  name: None  . Number of children: None  . Years of education: None  . Highest education level: None  Social Needs  . Financial resource strain: None  . Food insecurity - worry: None  . Food insecurity - inability: None  . Transportation needs - medical: None  . Transportation needs - non-medical: None  Occupational History  . None  Tobacco Use  . Smoking status: Never Smoker  . Smokeless tobacco: Never Used  Substance and Sexual Activity  . Alcohol use: No  . Drug use: No  . Sexual activity:  None  Other Topics Concern  . None  Social History Narrative  . None    Vitals:   03/01/17 0848  BP: 130/84  Pulse: 70  Resp: 12  Temp: 98.8 F (37.1 C)  SpO2: 96%   Body mass index is 34.87 kg/m.  Physical Exam  Nursing note and vitals reviewed. Constitutional: She is oriented to person, place, and time. She appears well-developed. No distress.  HENT:  Head: Normocephalic and atraumatic.  Eyes: Conjunctivae are normal.  Cardiovascular: Normal rate and regular rhythm.  Respiratory: Effort normal and breath sounds normal. No respiratory distress.  GI: Soft. She exhibits no mass. There is no hepatomegaly. There is no tenderness.  Genitourinary:  Genitourinary Comments: Breast no masses, no nipple discharge, no skin changes bilaterally. Pelvic deferred to gyn.  Musculoskeletal: She exhibits no edema or tenderness.  Lymphadenopathy:    She has no cervical adenopathy.    She has no axillary adenopathy.  Neurological: She is alert and oriented to person, place, and time. She has normal strength. Gait normal.  Skin: Skin is warm. Lesion noted. No rash noted. No erythema.  Left axilla with a 7 mm papular lesion with dark dimple in the center.  Tender upon palpation. No erythema or local heat appreciated.  Psychiatric: Her mood appears anxious.  Well groomed, good eye contact.     ASSESSMENT AND PLAN:   [name] was seen today for knot under left arm.  Diagnoses and all orders for this visit:  Abscess of axilla, left  Lesion was drained upon applying steady pressure, initially sebaceous material appreciated then purulent material was obtained at the end. Residual lesion is smaller (3-4 mm). She will continue warm compression a few times per day. Given her history of diabetes I recommended oral antibiotics. Instructed about warning signs.   -     doxycycline (VIBRA-TABS) 100 MG tablet; Take 1 tablet (100 mg total) by mouth 2 (two) times daily for 7 days.  Sebaceous  cyst  Sebaceous content drained. Explained that lesion can increase in size again, she may need surgical evaluation for excision depending of frequency of complications.  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 She will keep appointment with gynecologist.   25 min face to face OV. > 50% was dedicated to discussion of Dx, prognosis, treatment options. Anxious about skin lesion and vaginal mass, reassured in regard to sebaceous cyst, vaginal lesion seems to benign but Bx is going to be necessary to determine etiology and need for further treatment.  -Ms.[name] was advised to seek immediate medical attention if sudden worsening symptoms or to follow if they persist or if new concerns arise.       Charlaine Utsey G. [name], MD  [facility]. [place] office.

## 2017-03-08 ENCOUNTER — Ambulatory Visit (INDEPENDENT_AMBULATORY_CARE_PROVIDER_SITE_OTHER): Payer: Medicare Other | Admitting: Obstetrics & Gynecology

## 2017-03-08 ENCOUNTER — Encounter: Payer: Self-pay | Admitting: Obstetrics & Gynecology

## 2017-03-08 VITALS — BP 145/82 | HR 63 | Ht 64.0 in | Wt 202.4 lb

## 2017-03-08 DIAGNOSIS — N842 Polyp of vagina: Secondary | ICD-10-CM | POA: Diagnosis not present

## 2017-03-08 DIAGNOSIS — R110 Nausea: Secondary | ICD-10-CM

## 2017-03-08 MED ORDER — ONDANSETRON 4 MG PO TBDP: 4.0000 mg | ORAL_TABLET | Freq: Four times a day (QID) | ORAL | 3 refills | Status: DC | PRN

## 2017-03-08 NOTE — Patient Instructions (Signed)
Return to clinic for any scheduled appointments or for any gynecologic concerns as needed.   

## 2017-03-08 NOTE — Progress Notes (Signed)
   GYNECOLOGY OFFICE VISIT NOTE  History:  72 y.o. F here today for evaluation of incidentally noted vaginal mass during recent ED visit.  She is s/p hysterectomy many years ago for bleeding. She denies any abnormal vaginal discharge, bleeding, pelvic pain or other concerns.  She does report occasional nausea alleviated by Zofran usually prescribed by PCP but requests a refill from me today.   Past Medical History:  Diagnosis Date  . Arthritis   . Chicken pox   . Diabetes mellitus without complication ([place])   . Family history of polyps in the colon   . Frequent headaches   . Glaucoma   . Heart murmur   . Hypertension     Past Surgical History:  Procedure Laterality Date  . ABDOMINAL HYSTERECTOMY    . CHOLECYSTECTOMY    . TUBAL LIGATION    . 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    Review of Systems:  Pertinent items noted in HPI and remainder of comprehensive ROS otherwise negative.  Objective:  Physical Exam BP (!) 145/82 (BP Location: Right Leg, Patient Position: Sitting)   Pulse 63   Ht 5\' 4"  (1.626 m)   Wt 202 lb 6.4 oz (91.8 kg)   BMI 34.74 kg/m  CONSTITUTIONAL: Well-developed, well-nourished female in no acute distress.  HENT:  Normocephalic, atraumatic. External right and left ear normal. Oropharynx is clear and moist EYES: Conjunctivae and EOM are normal. Pupils are equal, round, and reactive to light. No scleral icterus.  NECK: Normal range of motion, supple, no masses SKIN: Skin is warm and dry. No rash noted. Not diaphoretic. No erythema. No pallor. NEUROLOGIC: Alert and oriented to person, place, and time. Normal reflexes, muscle tone coordination. No cranial nerve deficit noted. PSYCHIATRIC: Normal mood and affect. Normal behavior.  Normal judgment and thought content. CARDIOVASCULAR: Normal heart rate noted RESPIRATORY: Effort and breath sounds normal, no problems with respiration noted ABDOMEN: Soft, no distention noted.   PELVIC: Normal appearing external genitalia; normal appearing vaginal mucosa and cuff with moderate atrophy.  Small (7 mm) area extending from healed vaginal cuff incision noted, likely healed incision edge/granulation tissue. No concerning characteristics.  Biopsy not indicated.  No abnormal discharge noted.  No tenderness. MUSCULOSKELETAL: Normal range of motion. No edema noted.   Assessment & Plan:  1. Vaginal cuff polyp Benign, no intervention needed. Patient reassured.   2. Nausea Zofran ordered as per her request. Follow up with PCP if worsens.  - ondansetron (ZOFRAN ODT) 4 MG disintegrating tablet; Take 1 tablet (4 mg total) by mouth every 6 (six) hours as needed for nausea.  Dispense: 20 tablet; Refill: 3   Routine preventative health maintenance measures emphasized. Please refer to After Visit Summary for other counseling recommendations.    Total face-to-face time with patient: 20 minutes.  Over 50% of encounter was spent on counseling and coordination of care.   Verita Schneiders, MD, [place] [facility] [employer], [place]

## 2017-03-08 NOTE — Progress Notes (Signed)
Pt states mass a size of a quarter also feeling a little nauseated today.BP RE-CHECK: Left arm,146/66

## 2017-03-14 DIAGNOSIS — H401122 Primary open-angle glaucoma, left eye, moderate stage: Secondary | ICD-10-CM | POA: Diagnosis not present

## 2017-03-22 ENCOUNTER — Other Ambulatory Visit: Payer: Self-pay | Admitting: Family Medicine

## 2017-03-28 ENCOUNTER — Telehealth: Payer: Self-pay | Admitting: Family Medicine

## 2017-03-28 NOTE — Telephone Encounter (Signed)
Copied from [place] [id number]. Topic: Quick Communication - Rx Refill/Question >> Mar 28, 2017  1:44 PM Arletha Grippe wrote: Medication: lisinopril    Has the patient contacted their pharmacy? Yes.   Rx was changed over the phone, pharm says too soon   (Agent: If no, request that the patient contact the pharmacy for the refill.)   Preferred Pharmacy (with phone number or street name): [facility]    Agent: Please be advised that RX refills may take up to 3 business days. We ask that you follow-up with your pharmacy.

## 2017-03-29 ENCOUNTER — Ambulatory Visit (INDEPENDENT_AMBULATORY_CARE_PROVIDER_SITE_OTHER): Payer: Medicare Other | Admitting: Orthopaedic Surgery

## 2017-03-29 ENCOUNTER — Other Ambulatory Visit: Payer: Self-pay | Admitting: *Deleted

## 2017-03-29 DIAGNOSIS — I10 Essential (primary) hypertension: Secondary | ICD-10-CM

## 2017-03-29 MED ORDER — LISINOPRIL 10 MG PO TABS: 10.0000 mg | ORAL_TABLET | Freq: Every day | ORAL | 1 refills | Status: DC

## 2017-03-29 NOTE — Telephone Encounter (Signed)
Rx sent to pharmacy   

## 2017-03-29 NOTE — Telephone Encounter (Signed)
Pt called because the mail order pharmacy will not refill her lisinopril  As they say it is too soon,  She states she takes 2 a day and only has two left.  She is requesting that Dr [name]  Change her rx to 10 mg  And wants that sent to:  [facility] - [place], [place] AT [address] [phone] (Phone) [phone] (Fax)   Putting in as high priority because she will be out of the med tomorrow

## 2017-03-29 NOTE — Telephone Encounter (Signed)
TC to patient. Left VM to call office back as we have questions regarding her lisinopril request.

## 2017-04-04 DIAGNOSIS — H401122 Primary open-angle glaucoma, left eye, moderate stage: Secondary | ICD-10-CM | POA: Diagnosis not present

## 2017-06-27 ENCOUNTER — Other Ambulatory Visit: Payer: Self-pay | Admitting: *Deleted

## 2017-06-27 DIAGNOSIS — I10 Essential (primary) hypertension: Secondary | ICD-10-CM

## 2017-06-27 MED ORDER — LISINOPRIL 10 MG PO TABS: 10.0000 mg | ORAL_TABLET | Freq: Every day | ORAL | 1 refills | Status: DC

## 2017-06-30 DIAGNOSIS — E119 Type 2 diabetes mellitus without complications: Secondary | ICD-10-CM | POA: Diagnosis not present

## 2017-06-30 DIAGNOSIS — I10 Essential (primary) hypertension: Secondary | ICD-10-CM | POA: Diagnosis not present

## 2017-06-30 DIAGNOSIS — G4700 Insomnia, unspecified: Secondary | ICD-10-CM | POA: Diagnosis not present

## 2017-06-30 DIAGNOSIS — L239 Allergic contact dermatitis, unspecified cause: Secondary | ICD-10-CM | POA: Diagnosis not present

## 2017-07-13 DIAGNOSIS — H1032 Unspecified acute conjunctivitis, left eye: Secondary | ICD-10-CM | POA: Diagnosis not present

## 2017-09-26 ENCOUNTER — Other Ambulatory Visit: Payer: Self-pay | Admitting: Family Medicine

## 2017-09-26 DIAGNOSIS — I10 Essential (primary) hypertension: Secondary | ICD-10-CM

## 2017-10-23 ENCOUNTER — Other Ambulatory Visit: Payer: Self-pay | Admitting: Family Medicine

## 2017-10-23 DIAGNOSIS — Z1231 Encounter for screening mammogram for malignant neoplasm of breast: Secondary | ICD-10-CM

## 2017-10-31 DIAGNOSIS — H401122 Primary open-angle glaucoma, left eye, moderate stage: Secondary | ICD-10-CM | POA: Diagnosis not present

## 2017-11-07 ENCOUNTER — Encounter: Payer: Self-pay | Admitting: Family Medicine

## 2017-11-07 ENCOUNTER — Ambulatory Visit (INDEPENDENT_AMBULATORY_CARE_PROVIDER_SITE_OTHER): Payer: Medicare Other | Admitting: Family Medicine

## 2017-11-07 VITALS — BP 140/80 | HR 89 | Temp 98.5°F | Resp 12 | Ht 64.0 in | Wt 211.5 lb

## 2017-11-07 DIAGNOSIS — Z0000 Encounter for general adult medical examination without abnormal findings: Secondary | ICD-10-CM | POA: Diagnosis not present

## 2017-11-07 DIAGNOSIS — E119 Type 2 diabetes mellitus without complications: Secondary | ICD-10-CM

## 2017-11-07 DIAGNOSIS — I10 Essential (primary) hypertension: Secondary | ICD-10-CM

## 2017-11-07 DIAGNOSIS — Z23 Encounter for immunization: Secondary | ICD-10-CM | POA: Diagnosis not present

## 2017-11-07 DIAGNOSIS — I499 Cardiac arrhythmia, unspecified: Secondary | ICD-10-CM

## 2017-11-07 LAB — BASIC METABOLIC PANEL
BUN: 14 mg/dL (ref 6–23)
CO2: 31 mEq/L (ref 19–32)
CREATININE: 0.78 mg/dL (ref 0.40–1.20)
Calcium: 9.5 mg/dL (ref 8.4–10.5)
Chloride: 106 mEq/L (ref 96–112)
GFR: 93.71 mL/min (ref >60.00)
Glucose, Bld: 105 mg/dL — ABNORMAL HIGH (ref 70–99)
Potassium: 4.3 mEq/L (ref 3.5–5.1)
Sodium: 141 mEq/L (ref 135–145)

## 2017-11-07 LAB — MICROALBUMIN / CREATININE URINE RATIO
Creatinine,U: 90.8 mg/dL
Microalb Creat Ratio: 0.8 mg/g (ref 0.0–30.0)
Microalb, Ur: <0.7 mg/dL (ref 0.0–1.9)

## 2017-11-07 LAB — TSH: TSH: 1.29 u[IU]/mL (ref 0.35–4.50)

## 2017-11-07 LAB — HEMOGLOBIN A1C: Hgb A1c MFr Bld: 6.4 % (ref 4.6–6.5)

## 2017-11-07 NOTE — Progress Notes (Signed)
HPI:   [name] is a 72 y.o. female, who is here today for her routine physical.  Last CPE: Over a year ago.  Regular exercise 3 or more time per week: No Following a healthy diet: She is trying to do better.  She lives with her husband. She has been under some stress because her husband was Dx with lung cancer,underwent surgery and completed chemotherapy.  Chronic medical problems: DM II,HTN,HLD,and lower back pain.   Immunization History  Administered Date(s) Administered  . PPD Test 10/03/2000, 03/01/2002, 06/09/2003, 06/14/2004  . Pneumococcal Conjugate-13 11/07/2017  . Td 07/01/1996  . Zoster 05/20/2011    Mammogram: 09/2016. Colonoscopy: 01/2015 DEXA: 2016.  Hep C screening has ben done in [place] and reported as NR.  Follow-up:  Diabetes Mellitus II:  Currently on non pharmacologic treatment. Last HgA1C 6.5 in 01/2017. Checking BS's : Not checking regularly.   She denies abdominal pain, nausea, vomiting, polydipsia, polyuria, or polyphagia. No numbness, tingling, or burning.   Lab Results  Component Value Date   MICROALBUR <0.7 10/04/2016   HTN: She is on Lisinopril 10 mg daily. Denies severe/frequent headache, visual changes, chest pain, dyspnea, palpitation, claudication, focal weakness, or edema. Checks BP occasionally and usually "good", < 140/90.   Functional Status Survey: Is the patient deaf or have difficulty hearing?: No Does the patient have difficulty seeing, even when wearing glasses/contacts?: No Does the patient have difficulty concentrating, remembering, or making decisions?: No Does the patient have difficulty walking or climbing stairs?: No Does the patient have difficulty dressing or bathing?: No Does the patient have difficulty doing errands alone such as visiting a doctor's office or shopping?: No  Fall Risk  11/07/2017 04/21/2016  Falls in the past year? No No     Providers she sees regularly:  Eye care  provider: Dr Alphonzo [place] visit 10/31/17, next appt 03/01/18. Cardiologist,Dr Hirty. Last seen 05/2016 for chest pain, follow up prn. GI,Dr Loletha Carrow, last visit 2018, f/u prn   Depression screen [facility] 2/9 11/07/2017  Decreased Interest 0  Down, Depressed, Hopeless 0  PHQ - 2 Score 0  Altered sleeping -  Tired, decreased energy -  Change in appetite -  Feeling bad or failure about yourself  -  Trouble concentrating -  Moving slowly or fidgety/restless -  Suicidal thoughts -  PHQ-9 Score -    Mini-Cog - 11/07/17 1052    Normal clock drawing test?  yes    How many words correct?  3       Vision Screening Comments: Refused.Had eye exam 10/31/17.  Review of Systems  Constitutional: Negative for appetite change, fatigue and fever.  HENT: Negative for dental problem, hearing loss, mouth sores, sore throat, trouble swallowing and voice change.   Eyes: Negative for redness and visual disturbance.  Respiratory: Negative for cough, shortness of breath and wheezing.   Cardiovascular: Negative for chest pain, palpitations and leg swelling.  Gastrointestinal: Negative for abdominal pain, nausea and vomiting.       No changes in bowel habits.  Endocrine: Negative for cold intolerance, heat intolerance, polydipsia, polyphagia and polyuria.  Genitourinary: Negative for decreased urine volume, dysuria, hematuria, vaginal bleeding and vaginal discharge.  Musculoskeletal: Positive for arthralgias. Negative for gait problem and myalgias.  Skin: Negative for color change and rash.  Allergic/Immunologic: Negative for environmental allergies.  Neurological: Negative for syncope, weakness and headaches.  Hematological: Negative for adenopathy. Does not bruise/bleed easily.  Psychiatric/Behavioral: Negative for confusion and sleep  disturbance. The patient is not nervous/anxious.   All other systems reviewed and are negative.     Current Outpatient Medications on File Prior to Visit  Medication Sig  Dispense Refill  . acetaminophen (TYLENOL) 500 MG tablet Take 1,000 mg by mouth every 6 (six) hours as needed for headache (pain).    Marland Kitchen atorvastatin (LIPITOR) 20 MG tablet TAKE 1 TABLET BY MOUTH  DAILY 90 tablet 1  . glucose blood (ACCU-CHEK AVIVA) test strip Use as instructed 100 each 12  . hydrochlorothiazide (HYDRODIURIL) 25 MG tablet TAKE 1 TABLET BY MOUTH  DAILY 90 tablet 1  . Lancets 30G MISC TEST BLOOD SUGAR AS DIRECTED 1 each 5  . latanoprost (XALATAN) 0.005 % ophthalmic solution     . lisinopril (PRINIVIL,ZESTRIL) 10 MG tablet Take 1 tablet (10 mg total) by mouth daily. 90 tablet 1   No current facility-administered medications on file prior to visit.      Past Medical History:  Diagnosis Date  . Arthritis   . Chicken pox   . Diabetes mellitus without complication ([place])   . Family history of polyps in the colon   . Frequent headaches   . Glaucoma   . Heart murmur   . Hypertension     Past Surgical History:  Procedure Laterality Date  . ABDOMINAL HYSTERECTOMY    . CHOLECYSTECTOMY    . TUBAL LIGATION    . 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 Latex Rash    Powder latex    Family History  Problem Relation Age of Onset  . Diabetes Mother   . Hypertension Mother   . Hyperlipidemia Mother   . Diabetes Sister   . Hypertension Sister   . Diabetes Brother     Social History   Socioeconomic History  . Marital status: Married    Spouse name: Not on file  . Number of children: Not on file  . Years of education: Not on file  . Highest education level: Not on file  Occupational History  . Not on file  Social Needs  . Financial resource strain: Not on file  . Food insecurity:    Worry: Not on file    Inability: Not on file  . Transportation needs:    Medical: Not on file    Non-medical: Not on file  Tobacco Use  . Smoking status: Never Smoker  . Smokeless tobacco: Never Used  Substance and Sexual Activity  . Alcohol  use: No  . Drug use: No  . Sexual activity: Not on file  Lifestyle  . Physical activity:    Days per week: Not on file    Minutes per session: Not on file  . Stress: Not on file  Relationships  . Social connections:    Talks on phone: Not on file    Gets together: Not on file    Attends religious service: Not on file    Active member of club or organization: Not on file    Attends meetings of clubs or organizations: Not on file    Relationship status: Not on file  Other Topics Concern  . Not on file  Social History Narrative  . Not on file     Vitals:   11/07/17 0951  BP: 140/80  Pulse: 89  Resp: 12  Temp: 98.5 F (36.9 C)  SpO2: 90%   Body mass index is 36.3 kg/m.   Wt Readings from Last  3 Encounters:  11/07/17 211 lb 8 oz (95.9 kg)  03/08/17 202 lb 6.4 oz (91.8 kg)  03/01/17 203 lb 2 oz (92.1 kg)    Physical Exam  Nursing note and vitals reviewed. Constitutional: She is oriented to person, place, and time. She appears well-developed. No distress.  HENT:  Head: Normocephalic and atraumatic.  Mouth/Throat: Oropharynx is clear and moist and mucous membranes are normal.  Eyes: Pupils are equal, round, and reactive to light. Conjunctivae are normal.  Neck: No tracheal deviation present. No thyromegaly present.  Cardiovascular: Normal rate. An irregular rhythm present.  Occasional extrasystoles are present.  Murmur (soft SEM RUSB) heard. Pulses:      Dorsalis pedis pulses are 2+ on the right side, and 2+ on the left side.  Respiratory: Effort normal and breath sounds normal. No respiratory distress.  GI: Soft. She exhibits no mass. There is no hepatomegaly. There is no tenderness.  Genitourinary:  Genitourinary Comments: Breast: No masses,nipple discharge,or skin abnormalities appreciated.  Musculoskeletal: She exhibits no edema.  Lymphadenopathy:    She has no cervical adenopathy.    She has no axillary adenopathy.       Right: No supraclavicular adenopathy  present.       Left: No supraclavicular adenopathy present.  Neurological: She is alert and oriented to person, place, and time. She has normal strength. No cranial nerve deficit. Gait normal.  Reflex Scores:      Bicep reflexes are 2+ on the right side and 2+ on the left side.      Patellar reflexes are 2+ on the right side and 2+ on the left side. Skin: Skin is warm. No rash noted. No erythema.  Psychiatric: She has a normal mood and affect.  Well groomed, good eye contact.      ASSESSMENT AND PLAN:  Ms. RYLA CAUTHON was here today annual physical examination.   Orders Placed This Encounter  Procedures  . Pneumococcal conjugate vaccine 13-valent IM  . Hemoglobin A1c  . Basic metabolic panel  . TSH  . Microalbumin / creatinine urine ratio  . EKG 12-Lead   Lab Results  Component Value Date   TSH 1.29 11/07/2017   Lab Results  Component Value Date   HGBA1C 6.4 11/07/2017   Lab Results  Component Value Date   CREATININE 0.78 11/07/2017   BUN 14 11/07/2017   NA 141 11/07/2017   K 4.3 11/07/2017   CL 106 11/07/2017   CO2 31 11/07/2017   Lab Results  Component Value Date   MICROALBUR <0.7 11/07/2017    Routine general medical examination at a health care facility  We discussed the importance of regular physical activity and healthy diet for prevention of chronic illness and/or complications. Preventive guidelines reviewed. Vaccination updated.  Ca++ and vit D supplementation recommended. Next CPE in a year.  Medicare annual wellness visit, subsequent We discussed the importance of staying active, physically and mentally, as well as the benefits of a healthy/balance diet. Low impact exercise that involve stretching and strengthing are ideal.  We discussed preventive screening for the next 5-10 years, summery of recommendations given in AVS:  Colonoscopy 2023 Mammogram every 1 to 2 years. Bone density to be done with her next mammogram,11/20/17.  Glaucoma  screening/eye exam every year  Flu vaccine annually.   Fall prevention   Advance directives and end of life discussed, strongly recommend having POA and living will,forms given to be completed.    Irregular heart rate  Asymptomatic. EKG today:  SR,normal axis,HR 60/min,unsp T wave abnormalities on anteroseptal leads,and PVC's. Compared with EKG 03/2016 PVC's now present,rest no significant changes. Instructed about warning signs.  -     EKG 12-Lead -     Basic metabolic panel -     TSH  Need for vaccination against Streptococcus pneumoniae using pneumococcal conjugate vaccine 13 -     Pneumococcal conjugate vaccine 13-valent IM   Hypertension, essential, benign Reporting "good" BP readings at home. No changes in current management. Low salt diet and annual exam eye recommended. F/U in 5-6 months.  Diabetes mellitus, type II ([place]) HgA1C has been at goal, pending today. No changes in current management. Regular exercise and healthy diet with avoidance of added sugar food intake is an important part of treatment and recommended. Annual eye exam, periodic dental and foot care recommended. F/U in 5-6 months    Return in 6 months (on 05/09/2018) for HTN,DM,HLD.      Ethel Veronica G. [name], MD  [facility]. [place] office.

## 2017-11-07 NOTE — Assessment & Plan Note (Signed)
Reporting "good" BP readings at home. No changes in current management. Low salt diet and annual exam eye recommended. F/U in 5-6 months.

## 2017-11-07 NOTE — Assessment & Plan Note (Signed)
HgA1C has been at goal, pending today. No changes in current management. Regular exercise and healthy diet with avoidance of added sugar food intake is an important part of treatment and recommended. Annual eye exam, periodic dental and foot care recommended. F/U in 5-6 months

## 2017-11-07 NOTE — Patient Instructions (Addendum)
A few things to remember from today's visit:   Routine general medical examination at a health care facility  Irregular heart rate - Plan: EKG [id number], Basic metabolic panel, TSH  Hypertension, essential, benign - Plan: Basic metabolic panel  Type 2 diabetes mellitus without complication, without long-term current use of insulin (HCC) - Plan: Hemoglobin A1c, Microalbumin / creatinine urine ratio  Medicare annual wellness visit, subsequent  A few tips:  -As we age balance is not as good as it was, so there is a higher risks for falls. Please remove small rugs and furniture that is "in your way" and could increase the risk of falls. Stretching exercises may help with fall prevention: Yoga and Tai Chi are some examples. Low impact exercise is better, so you are not very achy the next day.  -Sun screen and avoidance of direct sun light recommended. Caution with dehydration, if working outdoors be sure to drink enough fluids.  - Some medications are not safe as we age, increases the risk of side effects and can potentially interact with other medication you are also taken;  including some of over the counter medications. Be sure to let me know when you start a new medication even if it is a dietary/vitamin supplement.   -Healthy diet low in red meet/animal fat and sugar + regular physical activity is recommended.       Screening schedule for the next 5-10 years:  Colonoscopy 2023 Mammogram every 1 to 2 years. Bone density to be done with her next mammogram.  Glaucoma screening/eye exam every year  Flu vaccine annually.   Fall prevention   Advance directives:  Please see a lawyer and/or go to this website to help you with advanced directives and designating a health care power of attorney so that your wishes will be followed should you become too ill to make your own medical decisions.  [email or web address]        Please be sure medication list is  accurate. If a new problem present, please set up appointment sooner than planned today.

## 2017-11-11 ENCOUNTER — Encounter: Payer: Self-pay | Admitting: Family Medicine

## 2017-11-14 ENCOUNTER — Encounter: Payer: Self-pay | Admitting: Family Medicine

## 2017-11-14 ENCOUNTER — Other Ambulatory Visit: Payer: Self-pay | Admitting: *Deleted

## 2017-11-14 DIAGNOSIS — E2839 Other primary ovarian failure: Secondary | ICD-10-CM

## 2017-11-20 ENCOUNTER — Other Ambulatory Visit: Payer: Self-pay | Admitting: Family Medicine

## 2017-11-20 ENCOUNTER — Ambulatory Visit
Admission: RE | Admit: 2017-11-20 | Discharge: 2017-11-20 | Disposition: A | Discharge status: Home / Self Care | Payer: Medicare Other | Source: Ambulatory Visit

## 2017-11-20 DIAGNOSIS — I10 Essential (primary) hypertension: Secondary | ICD-10-CM

## 2017-11-20 DIAGNOSIS — Z1231 Encounter for screening mammogram for malignant neoplasm of breast: Secondary | ICD-10-CM | POA: Diagnosis not present

## 2017-12-12 ENCOUNTER — Ambulatory Visit
Admission: RE | Admit: 2017-12-12 | Discharge: 2017-12-12 | Disposition: A | Discharge status: Home / Self Care | Payer: Medicare Other | Source: Ambulatory Visit | Attending: Family Medicine | Admitting: Family Medicine

## 2017-12-12 DIAGNOSIS — E2839 Other primary ovarian failure: Secondary | ICD-10-CM

## 2017-12-12 DIAGNOSIS — Z780 Asymptomatic menopausal state: Secondary | ICD-10-CM | POA: Diagnosis not present

## 2017-12-12 DIAGNOSIS — Z13820 Encounter for screening for osteoporosis: Secondary | ICD-10-CM | POA: Diagnosis not present

## 2017-12-13 ENCOUNTER — Encounter: Payer: Self-pay | Admitting: Family Medicine

## 2018-01-16 DIAGNOSIS — M25561 Pain in right knee: Secondary | ICD-10-CM | POA: Diagnosis not present

## 2018-01-16 DIAGNOSIS — S86911A Strain of unspecified muscle(s) and tendon(s) at lower leg level, right leg, initial encounter: Secondary | ICD-10-CM | POA: Diagnosis not present

## 2018-01-16 DIAGNOSIS — I10 Essential (primary) hypertension: Secondary | ICD-10-CM | POA: Diagnosis not present

## 2018-01-16 DIAGNOSIS — E119 Type 2 diabetes mellitus without complications: Secondary | ICD-10-CM | POA: Diagnosis not present

## 2018-01-31 ENCOUNTER — Other Ambulatory Visit: Payer: Self-pay | Admitting: Family Medicine

## 2018-01-31 ENCOUNTER — Ambulatory Visit (INDEPENDENT_AMBULATORY_CARE_PROVIDER_SITE_OTHER): Payer: Medicare Other | Admitting: Family Medicine

## 2018-01-31 ENCOUNTER — Ambulatory Visit (INDEPENDENT_AMBULATORY_CARE_PROVIDER_SITE_OTHER): Payer: Medicare Other

## 2018-01-31 ENCOUNTER — Encounter: Payer: Self-pay | Admitting: Family Medicine

## 2018-01-31 VITALS — BP 140/78 | HR 65 | Temp 99.2°F | Resp 12 | Ht 64.0 in | Wt 211.4 lb

## 2018-01-31 DIAGNOSIS — Y92009 Unspecified place in unspecified non-institutional (private) residence as the place of occurrence of the external cause: Secondary | ICD-10-CM | POA: Diagnosis not present

## 2018-01-31 DIAGNOSIS — S8991XD Unspecified injury of right lower leg, subsequent encounter: Secondary | ICD-10-CM

## 2018-01-31 DIAGNOSIS — M25561 Pain in right knee: Secondary | ICD-10-CM | POA: Diagnosis not present

## 2018-01-31 DIAGNOSIS — I10 Essential (primary) hypertension: Secondary | ICD-10-CM | POA: Diagnosis not present

## 2018-01-31 DIAGNOSIS — W19XXXD Unspecified fall, subsequent encounter: Secondary | ICD-10-CM

## 2018-01-31 DIAGNOSIS — S8991XA Unspecified injury of right lower leg, initial encounter: Secondary | ICD-10-CM | POA: Diagnosis not present

## 2018-01-31 MED ORDER — CELECOXIB 100 MG PO CAPS: 100.0000 mg | ORAL_CAPSULE | Freq: Two times a day (BID) | ORAL | 0 refills | Status: DC

## 2018-01-31 NOTE — Patient Instructions (Signed)
A few things to remember from today's visit:   Fall in home, subsequent encounter - Plan: DG Knee Complete 4 Views Right, MR Knee Right Wo Contrast  Injury of right knee, subsequent encounter - Plan: DG Knee Complete 4 Views Right, MR Knee Right Wo Contrast, celecoxib (CELEBREX) 100 MG capsule  Local ice,elevation, and rest. Continue monitoring blood pressure at home.   Please be sure medication list is accurate. If a new problem present, please set up appointment sooner than planned today.

## 2018-01-31 NOTE — Progress Notes (Signed)
ACUTE VISIT   HPI:  Chief Complaint  Patient presents with  . Follow-up    follow-up from fall on 01/15/18, right knee pain and swelling    Kelsey Conway is a 73 y.o. female, who is here today complaining of right knee pain. Pain started after fall on 01/15/2017, she stepped out to pick up a package on her porch, coming back to the house slipped on wet floor. She landed  evaluated in acute care on 01/16/2017. According to patient knee x-ray did not show fracture, some OA. Edema and ecchymosis have improved but pain has not. Constant throbbing pain, affecting the whole knee.  It is exacerbated by movement and alleviated by rest. When asked to rate pain level in a scale from 1 to 10 she states that pain is 20/10.  Limitation of movement. Pain is keeping her from sleep.  She denies prior history of knee pain.  She received a prescription for Percocet to take every 6 hours, which helped "little" with pain. Percocet helps her "relax" at night.     BP slightly elevated today. Hypertension currently on Lisinopril 10 mg and HCTZ 25 mg daily. She is not checking BP at home.  Negative for unusual headache, visual changes, exertional chest pain, dyspnea,  focal weakness, or edema.   Lab Results  Component Value Date   CREATININE 0.78 11/07/2017   BUN 14 11/07/2017   NA 141 11/07/2017   K 4.3 11/07/2017   CL 106 11/07/2017   CO2 31 11/07/2017     Review of Systems  Constitutional: Positive for activity change. Negative for appetite change, fatigue and fever.  Eyes: Negative for redness and visual disturbance.  Respiratory: Negative for cough and shortness of breath.   Cardiovascular: Negative for chest pain and palpitations.  Gastrointestinal: Negative for abdominal pain, nausea and vomiting.       Negative for changes in bowel habits.  Genitourinary: Negative for decreased urine volume, dysuria and hematuria.  Musculoskeletal: Positive for arthralgias, gait  problem and joint swelling.  Skin: Negative for wound.  Neurological: Negative for syncope, weakness, numbness and headaches.  Psychiatric/Behavioral: Positive for sleep disturbance.      Current Outpatient Medications on File Prior to Visit  Medication Sig Dispense Refill  . acetaminophen (TYLENOL) 500 MG tablet Take 1,000 mg by mouth every 6 (six) hours as needed for headache (pain).    Marland Kitchen atorvastatin (LIPITOR) 20 MG tablet TAKE 1 TABLET BY MOUTH  DAILY 90 tablet 1  . glucose blood (ACCU-CHEK AVIVA) test strip Use as instructed 100 each 12  . hydrochlorothiazide (HYDRODIURIL) 25 MG tablet TAKE 1 TABLET BY MOUTH  DAILY 90 tablet 1  . Lancets 30G MISC TEST BLOOD SUGAR AS DIRECTED 1 each 5  . latanoprost (XALATAN) 0.005 % ophthalmic solution     . lisinopril (PRINIVIL,ZESTRIL) 10 MG tablet TAKE 1 TABLET BY MOUTH  DAILY 90 tablet 1   No current facility-administered medications on file prior to visit.      Past Medical History:  Diagnosis Date  . Arthritis   . Chicken pox   . Diabetes mellitus without complication ([place])   . Family history of polyps in the colon   . Frequent headaches   . Glaucoma   . Heart murmur   . Hypertension    Allergies  Allergen Reactions  . Latex Rash    Powder latex    Social History   Socioeconomic History  . Marital status: Married  Spouse name: Not on file  . Number of children: Not on file  . Years of education: Not on file  . Highest education level: Not on file  Occupational History  . Not on file  Social Needs  . Financial resource strain: Not on file  . Food insecurity:    Worry: Not on file    Inability: Not on file  . Transportation needs:    Medical: Not on file    Non-medical: Not on file  Tobacco Use  . Smoking status: Never Smoker  . Smokeless tobacco: Never Used  Substance and Sexual Activity  . Alcohol use: No  . Drug use: No  . Sexual activity: Not on file  Lifestyle  . Physical activity:    Days per week:  Not on file    Minutes per session: Not on file  . Stress: Not on file  Relationships  . Social connections:    Talks on phone: Not on file    Gets together: Not on file    Attends religious service: Not on file    Active member of club or organization: Not on file    Attends meetings of clubs or organizations: Not on file    Relationship status: Not on file  Other Topics Concern  . Not on file  Social History Narrative  . Not on file    Vitals:   01/31/18 1511  BP: 140/78  Pulse: 65  Resp: 12  Temp: 99.2 F (37.3 C)  SpO2: 98%   Body mass index is 36.28 kg/m.    Physical Exam  Nursing note and vitals reviewed. Constitutional: She is oriented to person, place, and time. She appears well-developed. She does not appear ill. No distress.  HENT:  Head: Normocephalic and atraumatic.  Eyes: Conjunctivae are normal.  Cardiovascular: Normal rate and regular rhythm.  Pulses:      Dorsalis pedis pulses are 2+ on the right side.       Posterior tibial pulses are 2+ on the right side.  Respiratory: Effort normal. No respiratory distress.  Musculoskeletal:        General: Tenderness and edema present.     Right knee: She exhibits decreased range of motion, swelling, ecchymosis and bony tenderness. She exhibits no erythema and normal alignment. Tenderness found. Medial joint line and lateral joint line tenderness noted.     Comments: Guarded knee, very tender upon palpation,mainly on anterior aspect of patella.  Neurological: She is alert and oriented to person, place, and time. She has normal strength. Gait abnormal.  Antalgic gait.  Skin: Skin is warm. No rash noted. No erythema.  Psychiatric: She has a normal mood and affect.  Well groomed, good eye contact.      ASSESSMENT AND PLAN:   Kelsey Conway was seen today for follow-up.  Diagnoses and all orders for this visit:  Injury of right knee, subsequent encounter She would like to have X ray repeated. Because still  having severe pain and effusion,knee MRI will be arranged. LE elevation,local ice,and rest. Further recommendations will be given according to imaging results.  -     DG Knee Complete 4 Views Right; Future -     MR Knee Right Wo Contrast; Future -     celecoxib (CELEBREX) 100 MG capsule; Take 1 capsule (100 mg total) by mouth 2 (two) times daily for 10 days. -     DG Knee Complete 4 Views Right  Fall in home, subsequent encounter Fall precautions discussed.  Education provided.  -     DG Knee Complete 4 Views Right; Future -     MR Knee Right Wo Contrast; Future -     DG Knee Complete 4 Views Right  Hypertension, essential, benign  SBP mildly elevated. Recommend monitoring BP daily. Side effects of Celebrex discussed. Keep next F/U appt.    Return if symptoms worsen or fail to improve.        G. [name], MD  [facility]. [place] office.

## 2018-02-01 ENCOUNTER — Encounter: Payer: Self-pay | Admitting: Family Medicine

## 2018-02-01 MED ORDER — CELECOXIB 100 MG PO CAPS: 100.0000 mg | ORAL_CAPSULE | Freq: Two times a day (BID) | ORAL | 0 refills | Status: AC

## 2018-02-03 ENCOUNTER — Ambulatory Visit
Admission: RE | Admit: 2018-02-03 | Discharge: 2018-02-03 | Disposition: A | Discharge status: Home / Self Care | Payer: Medicare Other | Source: Ambulatory Visit | Attending: Family Medicine | Admitting: Family Medicine

## 2018-02-03 DIAGNOSIS — S8991XD Unspecified injury of right lower leg, subsequent encounter: Secondary | ICD-10-CM

## 2018-02-03 DIAGNOSIS — M25561 Pain in right knee: Secondary | ICD-10-CM | POA: Diagnosis not present

## 2018-02-03 DIAGNOSIS — W19XXXD Unspecified fall, subsequent encounter: Secondary | ICD-10-CM

## 2018-02-03 DIAGNOSIS — Y92009 Unspecified place in unspecified non-institutional (private) residence as the place of occurrence of the external cause: Principal | ICD-10-CM

## 2018-02-05 ENCOUNTER — Encounter: Payer: Self-pay | Admitting: Family Medicine

## 2018-02-05 ENCOUNTER — Other Ambulatory Visit: Payer: Self-pay | Admitting: Family Medicine

## 2018-02-05 DIAGNOSIS — S83281D Other tear of lateral meniscus, current injury, right knee, subsequent encounter: Secondary | ICD-10-CM

## 2018-02-05 DIAGNOSIS — M25561 Pain in right knee: Secondary | ICD-10-CM

## 2018-02-12 ENCOUNTER — Encounter (INDEPENDENT_AMBULATORY_CARE_PROVIDER_SITE_OTHER): Payer: Self-pay | Admitting: Orthopaedic Surgery

## 2018-02-12 ENCOUNTER — Ambulatory Visit (INDEPENDENT_AMBULATORY_CARE_PROVIDER_SITE_OTHER): Payer: Medicare Other | Admitting: Orthopaedic Surgery

## 2018-02-12 ENCOUNTER — Telehealth (INDEPENDENT_AMBULATORY_CARE_PROVIDER_SITE_OTHER): Payer: Self-pay

## 2018-02-12 VITALS — Ht 64.0 in | Wt 211.0 lb

## 2018-02-12 DIAGNOSIS — M25561 Pain in right knee: Secondary | ICD-10-CM

## 2018-02-12 DIAGNOSIS — M1711 Unilateral primary osteoarthritis, right knee: Secondary | ICD-10-CM

## 2018-02-12 MED ORDER — METHYLPREDNISOLONE ACETATE 40 MG/ML IJ SUSP
40.0000 mg | INTRAMUSCULAR | Status: AC | PRN
  Administered 2018-02-12: 40 mg via INTRA_ARTICULAR

## 2018-02-12 MED ORDER — LIDOCAINE HCL 1 % IJ SOLN
3.0000 mL | INTRAMUSCULAR | Status: AC | PRN
  Administered 2018-02-12: 3 mL

## 2018-02-12 NOTE — Telephone Encounter (Signed)
Please submit gel injection for right knee- Dr Ninfa Linden.

## 2018-02-12 NOTE — Progress Notes (Signed)
Office Visit Note   Patient: Kelsey Conway           Date of Birth: [birth year]           MRN: [id number] Visit Date: 02/12/2018              Requested by: [place], [name], MD [address] [place], [place] [address] PCP: [place], [name], MD   Assessment & Plan: Visit Diagnoses:  1. Unilateral primary osteoarthritis, right knee   2. Acute pain of right knee     Plan: Although the MRI shows she has a lateral meniscal tear her symptoms are related to her severe arthritis of the medial compartment of the knee.  She understands arthroscopic intervention would not help this at all.  I did offer steroid injection in her knee today and then a hyaluronic acid a month from now.  I think the only surgical intervention that she would need would be a knee replacement given the full-thickness cartilage loss at the patellofemoral joint and the medial compartment of her knee.  She understands again that her arthroscopic intervention would not do anything for that at all and she would still have the pain in her knee.  She understands to that of mechanical fall in the setting of bad arthritis can make things worse with time.  Hopefully steroid injection will help her today as well as hyaluronic acid and the right knee 4 weeks now.  All question concerns were answered and addressed.  She tolerated steroid injection well.  In 4 weeks we will place hyaluronic acid into the right knee to treat the pain from osteoarthritis.  Follow-Up Instructions: Return in about 4 weeks (around 03/12/2018).   Orders:  Orders Placed This Encounter  Procedures  . Large Joint Inj   No orders of the defined types were placed in this encounter.     Procedures: Large Joint Inj: R knee on 02/12/2018 2:14 PM Indications: diagnostic evaluation and pain Details: 22 G 1.5 in needle, superolateral approach  Arthrogram: No  Medications: 3 mL lidocaine 1 %; 40 mg methylPREDNISolone acetate 40 MG/ML Outcome: tolerated well,  no immediate complications Procedure, treatment alternatives, risks and benefits explained, specific risks discussed. Consent was given by the patient. Immediately prior to procedure a time out was called to verify the correct patient, procedure, equipment, support staff and site/side marked as required. Patient was prepped and draped in the usual sterile fashion.       Clinical Data: No additional findings.   Subjective: Chief Complaint  Patient presents with  . Right Knee - Pain  The patient comes in with acute right knee pain after mechanical fall a few days before Christmas.  She actually has a history of surgery on the right knee.  She is 73 years old.  Plain films are on the canopy system as well as an MRI of her right knee.  They are here for my review.  She reports pain mainly at night when she is been up on her late knees all day.  She does report swelling.  Her tenderness is mainly at the medial joint line.  Again she had a history of remote arthroscopic intervention of that knee years ago.  She denies any locking catching.  HPI  Review of Systems She currently denies any headache, chest pain, shortness of breath, fever, chills, nausea, vomiting  Objective: Vital Signs: Ht 5\' 4"  (1.626 m)   Wt 211 lb (95.7 kg)   BMI 36.22  kg/m   Physical Exam She is alert oriented x3 and in no acute distress Ortho Exam She can flex and extend her right knee but is very painful.  She has significant medial joint line tenderness.  She does not have a true McMurray sign to the medial lateral compartments. Specialty Comments:  No specialty comments available.  Imaging: No results found. Plain films of the right knee are reviewed as well as the MRI.  She has areas of full-thickness cartilage loss of the patellofemoral joint and the weightbearing surface of the medial femoral condyle.  She does have a lateral meniscal tear but the cartilage is decent on the lateral compartment of the knee  with mild thinning.  The meniscal tear is more a free edge body tear and not a true flap tear.  PMFS History: Patient Active Problem List   Diagnosis Date Noted  . Low back pain 10/04/2016  . Chest pain 05/10/2016  . Abnormal EKG 05/10/2016  . Murmur 05/10/2016  . Mixed hyperlipidemia 05/10/2016  . Hypertension, essential, benign 05/03/2016  . Insomnia 05/03/2016  . Diabetes mellitus, type II (HCC) 05/03/2016  . Generalized osteoarthritis of multiple sites 05/03/2016  . Gastric mass 11/05/2014   Past Medical History:  Diagnosis Date  . Arthritis   . Chicken pox   . Diabetes mellitus without complication (HCC)   . Family history of polyps in the colon   . Frequent headaches   . Glaucoma   . Heart murmur   . Hypertension     Family History  Problem Relation Age of Onset  . Diabetes Mother   . Hypertension Mother   . Hyperlipidemia Mother   . Diabetes Sister   . Hypertension Sister   . Diabetes Brother     Past Surgical History:  Procedure Laterality Date  . ABDOMINAL HYSTERECTOMY    . CHOLECYSTECTOMY    . TUBAL LIGATION    . TUMOR REMOVAL  2017   Stomach tumor   Social History   Occupational History  . Not on file  Tobacco Use  . Smoking status: Never Smoker  . Smokeless tobacco: Never Used  Substance and Sexual Activity  . Alcohol use: No  . Drug use: No  . Sexual activity: Not on file

## 2018-02-15 NOTE — Telephone Encounter (Signed)
Noted  

## 2018-02-24 ENCOUNTER — Other Ambulatory Visit: Payer: Self-pay | Admitting: Family Medicine

## 2018-02-25 DIAGNOSIS — E119 Type 2 diabetes mellitus without complications: Secondary | ICD-10-CM | POA: Diagnosis not present

## 2018-02-25 DIAGNOSIS — J029 Acute pharyngitis, unspecified: Secondary | ICD-10-CM | POA: Diagnosis not present

## 2018-02-25 DIAGNOSIS — M791 Myalgia, unspecified site: Secondary | ICD-10-CM | POA: Diagnosis not present

## 2018-02-25 DIAGNOSIS — J069 Acute upper respiratory infection, unspecified: Secondary | ICD-10-CM | POA: Diagnosis not present

## 2018-02-27 ENCOUNTER — Telehealth (INDEPENDENT_AMBULATORY_CARE_PROVIDER_SITE_OTHER): Payer: Self-pay

## 2018-02-27 NOTE — Telephone Encounter (Signed)
Submitted VOB for SynviscOne, right knee. 

## 2018-03-01 DIAGNOSIS — R42 Dizziness and giddiness: Secondary | ICD-10-CM | POA: Diagnosis not present

## 2018-03-01 DIAGNOSIS — E119 Type 2 diabetes mellitus without complications: Secondary | ICD-10-CM | POA: Diagnosis not present

## 2018-03-01 DIAGNOSIS — R05 Cough: Secondary | ICD-10-CM | POA: Diagnosis not present

## 2018-03-06 ENCOUNTER — Telehealth (INDEPENDENT_AMBULATORY_CARE_PROVIDER_SITE_OTHER): Payer: Self-pay

## 2018-03-06 NOTE — Telephone Encounter (Signed)
Patient approved for SynviscOne, right knee. [place] Patient will be responsible for 20% OOP. No Co-pay No PA required  Appt.03/13/2018 with Dr. Ninfa Linden

## 2018-03-08 ENCOUNTER — Encounter: Payer: Self-pay | Admitting: Family Medicine

## 2018-03-08 DIAGNOSIS — R0602 Shortness of breath: Secondary | ICD-10-CM | POA: Diagnosis not present

## 2018-03-08 DIAGNOSIS — R42 Dizziness and giddiness: Secondary | ICD-10-CM | POA: Diagnosis not present

## 2018-03-09 ENCOUNTER — Other Ambulatory Visit: Payer: Self-pay | Admitting: Nurse Practitioner

## 2018-03-09 DIAGNOSIS — Z1231 Encounter for screening mammogram for malignant neoplasm of breast: Secondary | ICD-10-CM

## 2018-03-13 ENCOUNTER — Ambulatory Visit (INDEPENDENT_AMBULATORY_CARE_PROVIDER_SITE_OTHER): Payer: Medicare Other | Admitting: Orthopaedic Surgery

## 2018-03-13 ENCOUNTER — Encounter (INDEPENDENT_AMBULATORY_CARE_PROVIDER_SITE_OTHER): Payer: Self-pay | Admitting: Orthopaedic Surgery

## 2018-03-13 DIAGNOSIS — M1711 Unilateral primary osteoarthritis, right knee: Secondary | ICD-10-CM | POA: Diagnosis not present

## 2018-03-13 MED ORDER — HYLAN G-F 20 48 MG/6ML IX SOSY
48.0000 mg | PREFILLED_SYRINGE | INTRA_ARTICULAR | Status: AC | PRN
  Administered 2018-03-13: 48 mg via INTRA_ARTICULAR

## 2018-03-13 MED ORDER — LIDOCAINE HCL 1 % IJ SOLN
0.5000 mL | INTRAMUSCULAR | Status: AC | PRN
  Administered 2018-03-13: .5 mL

## 2018-03-13 NOTE — Progress Notes (Signed)
Procedure Note  Patient: Kelsey Conway             Date of Birth: May 22, 1945           MRN: [id number]             Visit Date: 03/13/2018 HPI: Kelsey Conway returns today for Synvisc 1 injection of the right knee.  She has known osteoarthritis patellofemoral compartment and medial compartment.  She states the cortisone injection helped some but she still has some pain in the knee.  She has had no new injury to the knee.  Physical exam: Right knee no effusion abnormal warmth erythema.  She has good range of motion of the knee without significant pain.  Tenderness peripatellar region.  No tenderness along medial joint line or lateral joint line.  Well-healed surgical incision anterior aspect of the right proximal tib-fib.  Procedures: Visit Diagnoses: Unilateral primary osteoarthritis, right knee  Large Joint Inj: R knee on 03/13/2018 10:35 AM Indications: pain Details: 22 G 1.5 in needle, anterolateral approach  Arthrogram: No  Medications: 0.5 mL lidocaine 1 %; 48 mg Hylan 48 MG/6ML Outcome: tolerated well, no immediate complications Procedure, treatment alternatives, risks and benefits explained, specific risks discussed. Consent was given by the patient. Immediately prior to procedure a time out was called to verify the correct patient, procedure, equipment, support staff and site/side marked as required. Patient was prepped and draped in the usual sterile fashion.     Plan: She will work on Dance movement psychotherapist.  Discussed with her that she can have cortisone injections in the knee every 3 months if needed.  In regards to the knee Synvisc 1 injections she can have these no more often than every 6 months she needs to call at least a month prior to wanting the injection done.  She will follow-up with [place] on a as needed basis.

## 2018-03-14 DIAGNOSIS — R0602 Shortness of breath: Secondary | ICD-10-CM | POA: Diagnosis not present

## 2018-03-15 DIAGNOSIS — R0602 Shortness of breath: Secondary | ICD-10-CM | POA: Diagnosis not present

## 2018-03-20 DIAGNOSIS — H401122 Primary open-angle glaucoma, left eye, moderate stage: Secondary | ICD-10-CM | POA: Diagnosis not present

## 2018-04-03 DIAGNOSIS — H401122 Primary open-angle glaucoma, left eye, moderate stage: Secondary | ICD-10-CM | POA: Diagnosis not present

## 2018-04-22 IMAGING — MR MR LUMBAR SPINE W/O CM
4 of 5 series · 18 of 48 positions shown · non-contrast
Comparison: CT abdomen pelvis 10/03/2016

CLINICAL DATA: Numbness left anterior thigh

EXAM:
MRI LUMBAR SPINE WITHOUT CONTRAST
TECHNIQUE: Multiplanar, multisequence MR imaging of the lumbar spine was
performed. No intravenous contrast was administered.

[Series 9: T2 · sagittal · 4.0mm · 0.73mm/px · 5 of 17 slices shown (1 of 2)]
[im 1/17]
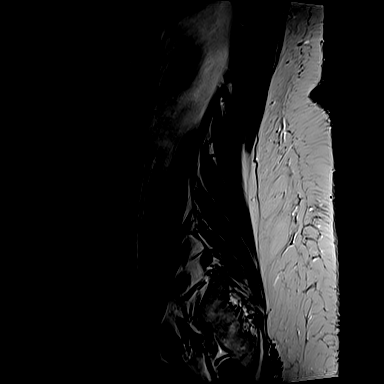
[im 5/17]
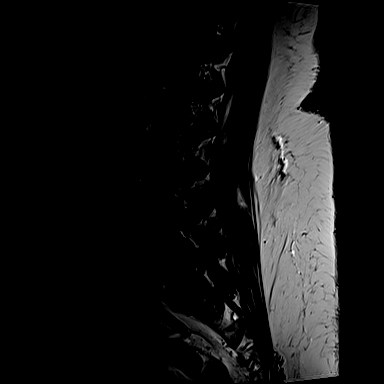
[im 9/17]
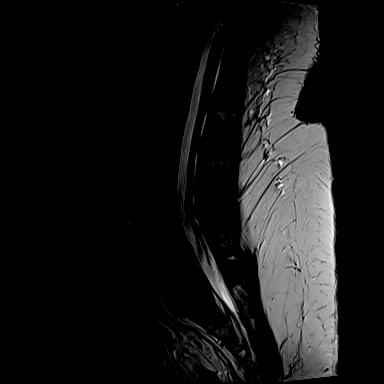
[im 13/17]
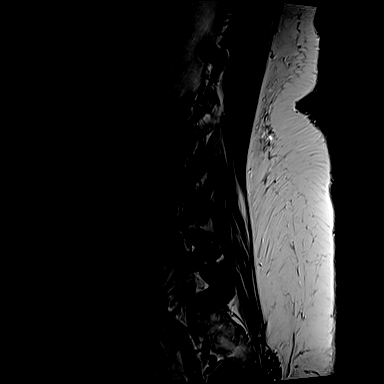
[im 17/17]
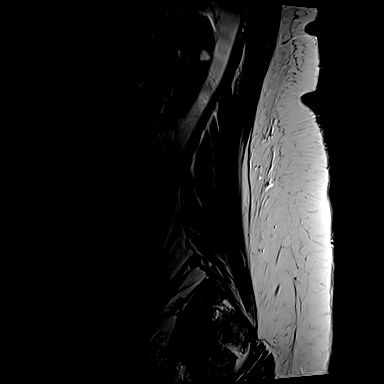

[Series 11: T1 · sagittal · 4.0mm · 0.73mm/px · 3 of 17 slices shown (1 of 2)]
[im 4/17]
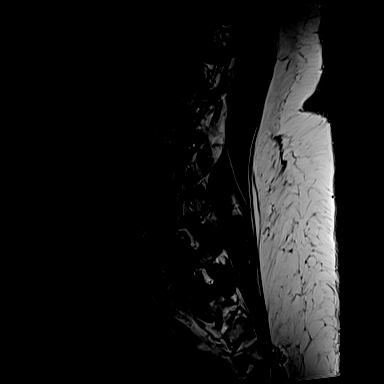
[im 10/17]
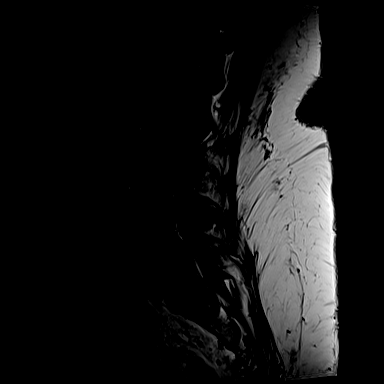
[im 17/17]
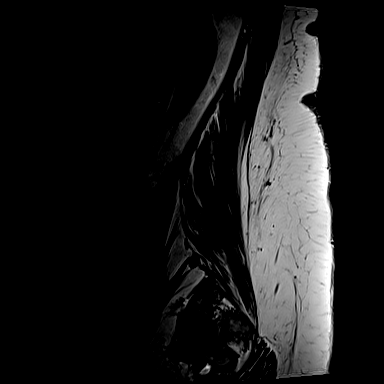

[Series 14: T2 · axial · 4.0mm · 0.31mm/px · z∈[+39,+250]mm · 7 of 47 slices shown (2 of 2)]
[im 4/47]
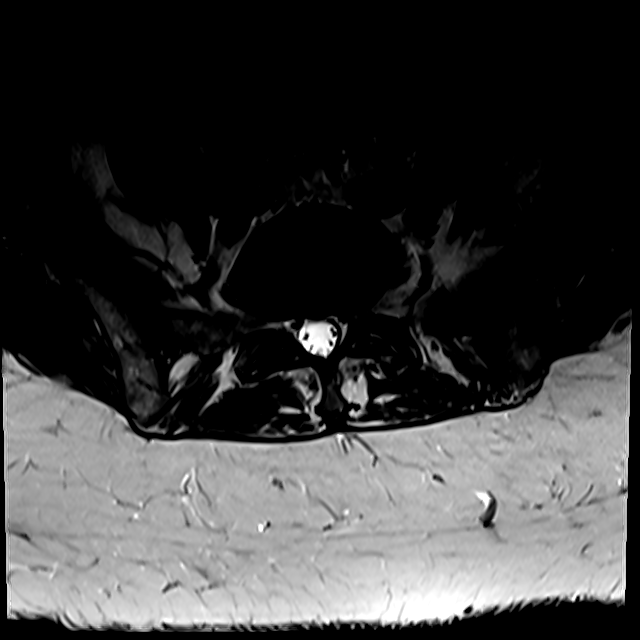
[im 7/47]
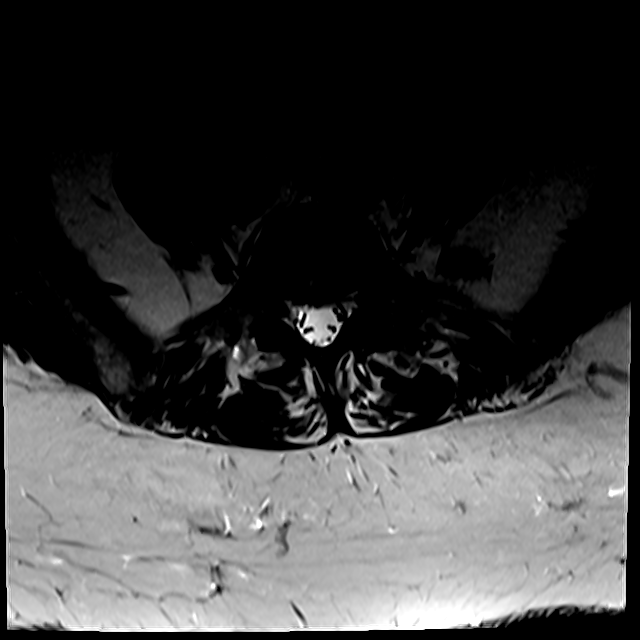
[im 10/47]
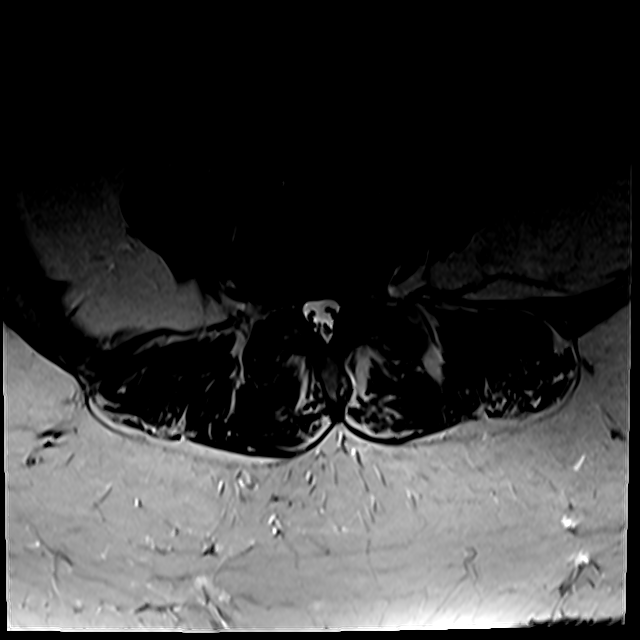
[im 16/47]
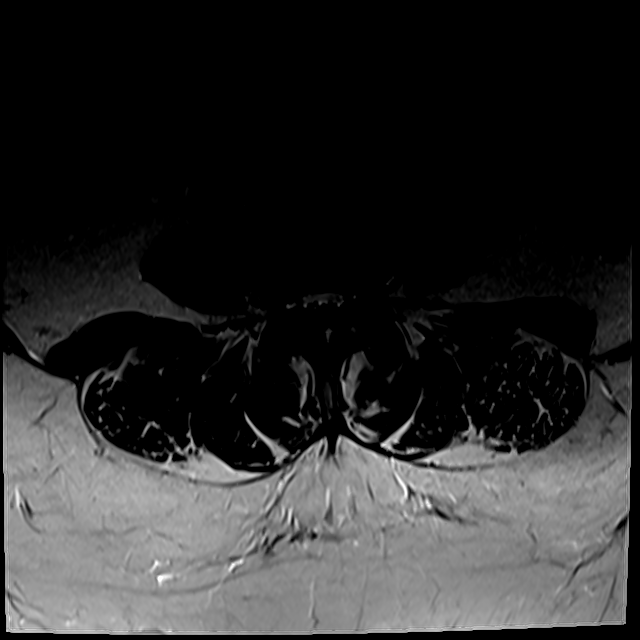
[im 22/47]
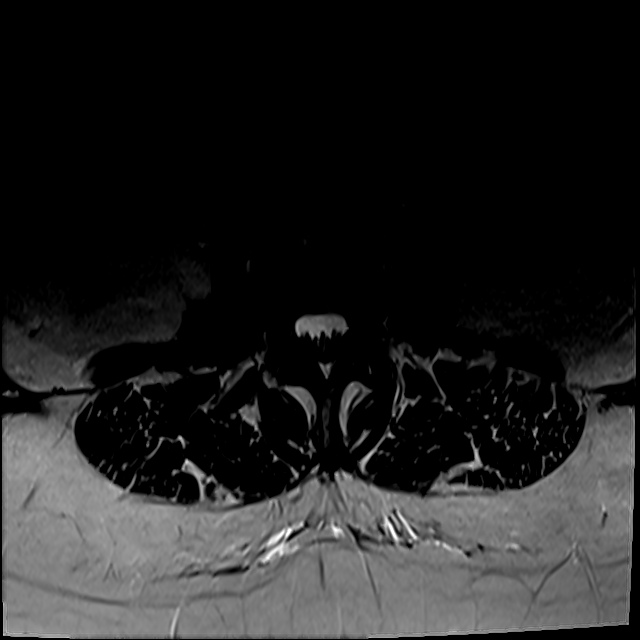
[im 25/47]
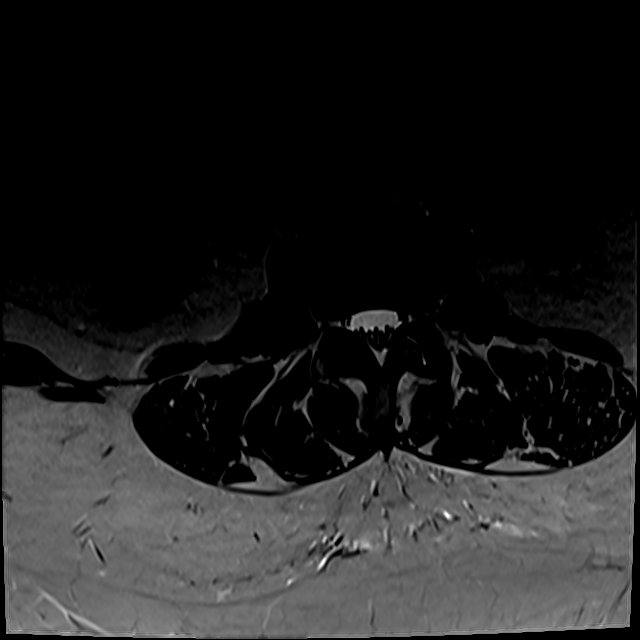
[im 40/47]
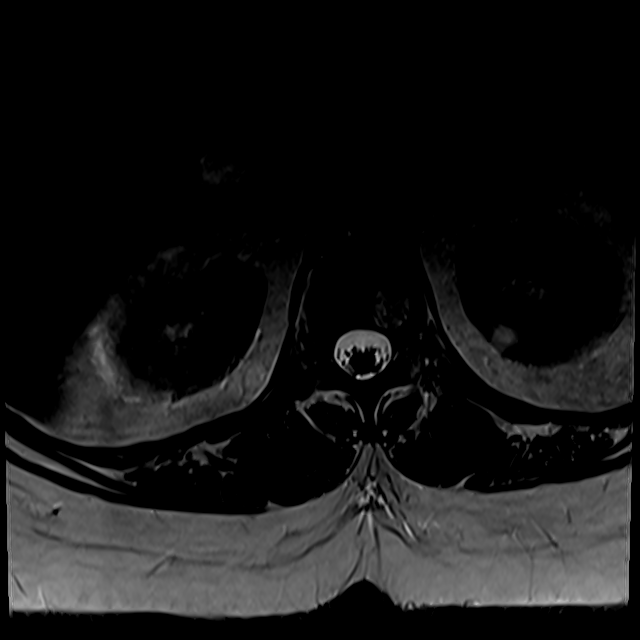

[Series 100: T1 · axial · 4.0mm · 0.31mm/px · z∈[+53,+250]mm · 3 of 47 slices shown (2 of 2)]
[im 7/47]
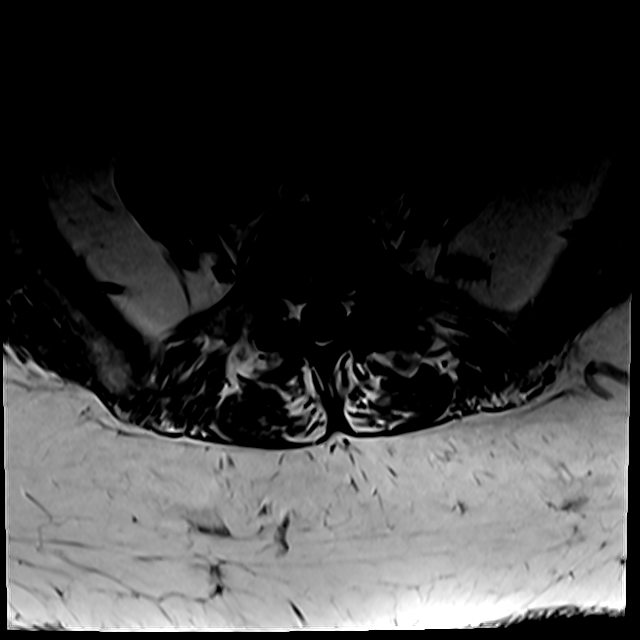
[im 25/47]
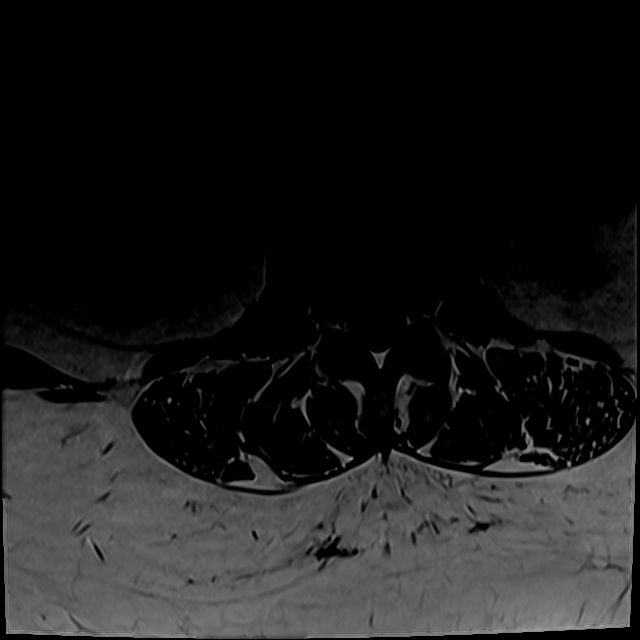
[im 40/47]
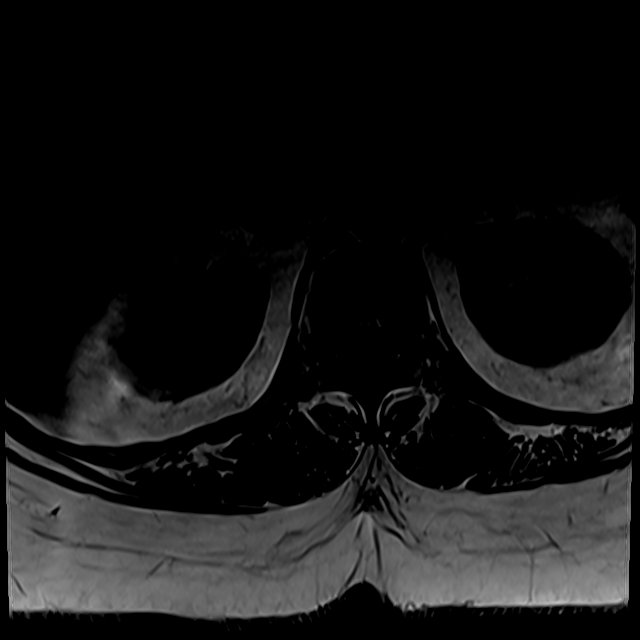

[18 of 48 positions shown; findings below may reference images not displayed]

FINDINGS: Segmentation:  Normal.  Lowest disc space L5-S1

Alignment:  Mild anterolisthesis L3-4 L4-5.

Vertebrae: Negative for fracture or mass. Hemangiomata T12, L1, L2,
L3. Negative for fracture or mass

Conus medullaris: Extends to the L1-2 level and appears normal.

Paraspinal and other soft tissues: Negative for mass or adenopathy

Disc levels:

L1-2:  Negative

L2-3:  Negative

L3-4: 3 mm anterolisthesis. Moderate facet degeneration bilaterally
causing mild spinal stenosis. Neural foramina patent. Subarticular
stenosis bilaterally.

L4-5: 5 mm anterolisthesis. Advanced facet degeneration. Mild spinal
stenosis with moderate subarticular stenosis bilaterally

L5-S1: Mild disc and facet degeneration. Subarticular stenosis on
the left.
IMPRESSION: Grade 1 anterolisthesis L3-4 with mild spinal stenosis and
subarticular stenosis bilaterally

5 mm anterolisthesis L4-5 with mild spinal stenosis and moderate
subarticular stenosis bilaterally

Subarticular stenosis on the left L5-S1 due to facet hypertrophy and
disc bulging.

## 2018-04-23 ENCOUNTER — Other Ambulatory Visit: Payer: Self-pay | Admitting: Family Medicine

## 2018-05-06 ENCOUNTER — Other Ambulatory Visit: Payer: Self-pay | Admitting: Family Medicine

## 2018-05-06 DIAGNOSIS — I10 Essential (primary) hypertension: Secondary | ICD-10-CM

## 2018-05-09 ENCOUNTER — Other Ambulatory Visit: Payer: Self-pay

## 2018-05-09 ENCOUNTER — Ambulatory Visit (INDEPENDENT_AMBULATORY_CARE_PROVIDER_SITE_OTHER): Payer: Medicare Other | Admitting: Family Medicine

## 2018-05-09 ENCOUNTER — Encounter: Payer: Self-pay | Admitting: Family Medicine

## 2018-05-09 DIAGNOSIS — I10 Essential (primary) hypertension: Secondary | ICD-10-CM

## 2018-05-09 DIAGNOSIS — E782 Mixed hyperlipidemia: Secondary | ICD-10-CM

## 2018-05-09 DIAGNOSIS — E118 Type 2 diabetes mellitus with unspecified complications: Secondary | ICD-10-CM

## 2018-05-09 DIAGNOSIS — Z6836 Body mass index (BMI) 36.0-36.9, adult: Secondary | ICD-10-CM

## 2018-05-09 DIAGNOSIS — E669 Obesity, unspecified: Secondary | ICD-10-CM | POA: Insufficient documentation

## 2018-05-09 NOTE — Assessment & Plan Note (Signed)
We discussed benefits of wt loss as well as adverse effects of obesity. Consistency with healthy diet and low impact physical activity recommended.

## 2018-05-09 NOTE — Assessment & Plan Note (Signed)
Tolerating medication well, so continue Lipitor 20 mg daily. Also recommend low-fat diet. We will plan on checking lipid panel in 10/2018.

## 2018-05-09 NOTE — Assessment & Plan Note (Signed)
BP adequately controlled. Continue lisinopril 10 mg daily and HCTZ 25 mg daily. Continue low-salt diet and monitoring BP regularly. Follow-up in 6 months.

## 2018-05-09 NOTE — Assessment & Plan Note (Signed)
Problem complicated by hypertension and hyperlipidemia. Hemoglobin A1c has been at goal.  Regular exercise and healthy diet with avoidance of added sugar food intake is an important part of treatment and recommended. Annual eye exam, periodic dental and foot care recommended. We will plan on checking A1c next visit, continue monitoring BS.

## 2018-05-09 NOTE — Progress Notes (Signed)
Virtual Visit via Video Note   I connected with Kelsey Conway on 05/09/18 at 10:30 AM EDT by a video enabled telemedicine application and verified that I am speaking with the correct person using two identifiers.  Location patient: home Location provider:home office Persons participating in the virtual visit: patient, provider  I discussed the limitations of evaluation and management by telemedicine and the availability of in person appointments. The patient expressed understanding and agreed to proceed.   HPI: Kelsey Conway is a 73 year old female with history of DM 2, hypertension, hyperlipidemia, and knee OA. Last follow-up on 11/11/2017. Last seen on 01/31/2018 due to knee injury.  Diabetes Mellitus II:  Currently on Nonpharmacologic treatment. Checking BS's : In the low 100s, today 110.  She denies abdominal pain, nausea, vomiting, polydipsia, polyuria, or polyphagia. Negative for numbness, tingling, or burning.   Lab Results  Component Value Date   HGBA1C 6.4 11/07/2017   Lab Results  Component Value Date   MICROALBUR <0.7 11/07/2017   HTN: She is currently on HCTZ 25 mg and lisinopril 10 mg daily. Home BP readings 120s-130s/80s to 90s, she does not check BP often.  She denies unusual headache, visual changes, chest pain, dyspnea, palpitation, decreased urine output, gross hematuria, foamy urine, neurologic deficit, or edema.  HLD, she is on Atorvastatin 20 mg daily. Tolerating medication well. She has not been consistent with a low fat diet.  Lab Results  Component Value Date   CHOL 132 01/30/2017   HDL 53.30 01/30/2017   LDLCALC 60 01/30/2017   TRIG 93.0 01/30/2017   CHOLHDL 2 01/30/2017   She has not been consistent with a healthful diet and has not been exercising regularly.  Since her last visit she has followed with orthopedist, she received 2 knee injections, which greatly helped with knee pain.  She has not had knee issues since intra-articular  injections.  ROS: See pertinent positives and negatives per HPI.  Past Medical History:  Diagnosis Date  . Arthritis   . Chicken pox   . Diabetes mellitus without complication ([place])   . Family history of polyps in the colon   . Frequent headaches   . Glaucoma   . Heart murmur   . Hypertension     Past Surgical History:  Procedure Laterality Date  . ABDOMINAL HYSTERECTOMY    . CHOLECYSTECTOMY    . TUBAL LIGATION    . 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 Diabetes Mother   . Hypertension Mother   . Hyperlipidemia Mother   . Diabetes Sister   . Hypertension Sister   . Diabetes Brother     Social History   Socioeconomic History  . Marital status: Married    Spouse name: Not on file  . Number of children: Not on file  . Years of education: Not on file  . Highest education level: Not on file  Occupational History  . Not on file  Social Needs  . Financial resource strain: Not on file  . Food insecurity:    Worry: Not on file    Inability: Not on file  . Transportation needs:    Medical: Not on file    Non-medical: Not on file  Tobacco Use  . Smoking status: Never Smoker  . Smokeless tobacco: Never Used  Substance and Sexual Activity  . Alcohol use: No  . Drug use: No  . Sexual activity: Not on file  Lifestyle  . Physical activity:    Days per week: Not on file    Minutes per session: Not on file  . Stress: Not on file  Relationships  . Social connections:    Talks on phone: Not on file    Gets together: Not on file    Attends religious service: Not on file    Active member of club or organization: Not on file    Attends meetings of clubs or organizations: Not on file    Relationship status: Not on file  . Intimate partner violence:    Fear of current or ex partner: Not on file    Emotionally abused: Not on file    Physically abused: Not on file    Forced sexual activity: Not on file  Other Topics  Concern  . Not on file  Social History Narrative  . Not on file      Current Outpatient Medications:  .  ACCU-CHEK AVIVA PLUS test strip, USE AS DIRECTED, Disp: 100 each, Rfl: 2 .  Accu-Chek Softclix Lancets lancets, USE TO TEST BLOOD SUGAR AS DIRECTED TWICE DAILY, Disp: 100 each, Rfl: 2 .  acetaminophen (TYLENOL) 500 MG tablet, Take 1,000 mg by mouth every 6 (six) hours as needed for headache (pain)., Disp: , Rfl:  .  atorvastatin (LIPITOR) 20 MG tablet, TAKE 1 TABLET BY MOUTH  DAILY, Disp: 90 tablet, Rfl: 1 .  hydrochlorothiazide (HYDRODIURIL) 25 MG tablet, TAKE 1 TABLET BY MOUTH  DAILY, Disp: 90 tablet, Rfl: 1 .  latanoprost (XALATAN) 0.005 % ophthalmic solution, , Disp: , Rfl:  .  lisinopril (PRINIVIL,ZESTRIL) 10 MG tablet, TAKE 1 TABLET BY MOUTH  DAILY, Disp: 90 tablet, Rfl: 1  EXAM:  VITALS per patient if applicable:BP 371/06   Pulse 72   Resp 16   GENERAL: alert, oriented, appears well and in no acute distress  HEENT: atraumatic, conjunctiva clear, no obvious facial abnormalities on inspection.  NECK: Otherwise normal movements of the head and neck  LUNGS: on inspection no signs of respiratory distress, breathing rate appears normal, no obvious gross SOB, gasping or wheezing  CV: no obvious cyanosis  MS: moves all visible extremities without noticeable abnormality  PSYCH/NEURO: pleasant and cooperative, no obvious depression or anxiety, speech and thought processing grossly intact  ASSESSMENT AND PLAN:  Discussed the following assessment and plan:  Controlled diabetes mellitus type 2 with complications (HCC) Problem complicated by hypertension and hyperlipidemia. Hemoglobin A1c has been at goal.  Regular exercise and healthy diet with avoidance of added sugar food intake is an important part of treatment and recommended. Annual eye exam, periodic dental and foot care recommended. We will plan on checking A1c next visit, continue monitoring BS.   Hypertension,  essential, benign BP adequately controlled. Continue lisinopril 10 mg daily and HCTZ 25 mg daily. Continue low-salt diet and monitoring BP regularly. Follow-up in 6 months.  Mixed hyperlipidemia Tolerating medication well, so continue Lipitor 20 mg daily. Also recommend low-fat diet. We will plan on checking lipid panel in 10/2018.  Class 2 obesity with body mass index (BMI) of 36.0 to 36.9 in adult We discussed benefits of wt loss as well as adverse effects of obesity. Consistency with healthy diet and low impact physical activity recommended.    I discussed the assessment and treatment plan with the patient. She was provided an opportunity to ask questions and all were answered. The patient agreed with the plan and demonstrated an understanding of the instructions.   Marland Kitchen  Return in about 6 months (around 11/08/2018) for cpe.    Kelsey Yamamoto [name], MD

## 2018-06-11 ENCOUNTER — Encounter: Payer: Self-pay | Admitting: Family Medicine

## 2018-07-05 ENCOUNTER — Ambulatory Visit: Payer: Self-pay

## 2018-07-05 ENCOUNTER — Encounter: Payer: Self-pay | Admitting: Family Medicine

## 2018-07-05 NOTE — Telephone Encounter (Signed)
Call placed to patient.  Left VM to return call to office. Returned call to patient who states that she started to cough yesterday and her nose is stuffy.  She denies fever, chest pain' SOB,sore throat, headache. She has no known exposure to COVID-19. She states that she has allergies or a summer cold. She was calling for OTC suggestions for her cough.  She has some benzonatate given to her for another cough that she has taken. Pt advised to seek the guidance of the pharmacist for the best OTC medication for her cough. Pt is concerned because she has HTN. Home care advice read to patient. Patient verbalized understanding. Pt encouraged to call back if symptoms worsen.  Reason for Disposition . Cough  Answer Assessment - Initial Assessment Questions 1. ONSET: "When did the cough begin?"      yesterday 2. SEVERITY: "How bad is the cough today?"     Keeps her up at night 3. RESPIRATORY DISTRESS: "Describe your breathing."     No 4. FEVER: "Do you have a fever?" If so, ask: "What is your temperature, how was it measured, and when did it start?"    No 5. HEMOPTYSIS: "Are you coughing up any blood?" If so ask: "How much?" (flecks, streaks, tablespoons, etc.)     No 6. TREATMENT: "What have you done so far to treat the cough?" (e.g., meds, fluids, humidifier)     Bensonate capsil 7. CARDIAC HISTORY: "Do you have any history of heart disease?" (e.g., heart attack, congestive heart failure)      no 8. LUNG HISTORY: "Do you have any history of lung disease?"  (e.g., pulmonary embolus, asthma, emphysema)     No 9. PE RISK FACTORS: "Do you have a history of blood clots?" (or: recent major surgery, recent prolonged travel, bedridden)     no 10. OTHER SYMPTOMS: "Do you have any other symptoms? (e.g., runny nose, wheezing, chest pain)      no 11. PREGNANCY: "Is there any chance you are pregnant?" "When was your last menstrual period?"       N/A 12. TRAVEL: "Have you traveled out of the country in the  last month?" (e.g., travel history, exposures)       no  Protocols used: COUGH - ACUTE NON-PRODUCTIVE-A-AH

## 2018-07-05 NOTE — Telephone Encounter (Signed)
Received message from Patient .  Attempted to reach out to Patient no answer.  Will attempt to call again.   [name], 73 y.o., 01-26-1945 MRN:  [id number] Phone:  [phone] Kelsey Conway) PCP:  [name], [name], MD Primary Cvg:  [place] [employer] Next Appt With Family Medicine 11/07/2018 at 9:45 AM Message from [name] sent at 07/05/2018 1:46 PM EDT  Summary: otc recommendation   Pt looking for otc recommendation for a cough. Pt has high blood pressure.  Please call back         Call History   Type Contact Phone  07/05/2018 01:45 PM EDT Phone (Incoming) Kelsey Conway, Kelsey Conway (Self) [phone] Kelsey Conway)  User: [name]

## 2018-07-06 NOTE — Telephone Encounter (Signed)
Message sent to Dr. Jordan for review. 

## 2018-07-08 ENCOUNTER — Other Ambulatory Visit: Payer: Self-pay | Admitting: Family Medicine

## 2018-07-09 NOTE — Telephone Encounter (Signed)
I gave some recommendations in this regard thorough My chart. She can also try OTC Delsym. Honey and hydration may also help. Continue monitoring for fever. Please advise to follow if symptoms get worse.  Thanks, BJ

## 2018-07-18 NOTE — Telephone Encounter (Signed)
Patient informed. 

## 2018-07-20 ENCOUNTER — Other Ambulatory Visit: Payer: Self-pay | Admitting: *Deleted

## 2018-07-20 MED ORDER — ATORVASTATIN CALCIUM 20 MG PO TABS: 20.0000 mg | ORAL_TABLET | Freq: Every day | ORAL | 1 refills | Status: DC

## 2018-09-19 ENCOUNTER — Encounter: Payer: Self-pay | Admitting: Family Medicine

## 2018-09-27 ENCOUNTER — Encounter: Payer: Self-pay | Admitting: Family Medicine

## 2018-10-02 ENCOUNTER — Encounter: Payer: Self-pay | Admitting: Family Medicine

## 2018-10-02 ENCOUNTER — Ambulatory Visit: Payer: Medicare Other | Admitting: Family Medicine

## 2018-10-23 ENCOUNTER — Other Ambulatory Visit: Payer: Self-pay | Admitting: Family Medicine

## 2018-10-23 DIAGNOSIS — I10 Essential (primary) hypertension: Secondary | ICD-10-CM

## 2018-11-07 ENCOUNTER — Ambulatory Visit: Payer: Medicare Other | Admitting: Family Medicine

## 2018-11-22 ENCOUNTER — Ambulatory Visit: Payer: Medicare Other

## 2018-11-27 ENCOUNTER — Telehealth: Payer: Self-pay

## 2018-11-27 NOTE — Telephone Encounter (Signed)
Copied from [place] [id number]. Topic: General - Other >> Nov 27, 2018  1:52 PM Leretha Pol wrote: Reason for CRM: [id number] Linward Foster, from [employer] [facility], called stating the quantiflo test for pt did come back positive stating that pt has moderate PAD. Please advise.

## 2018-11-30 NOTE — Telephone Encounter (Signed)
Spoke with patient. Informed her of message from PCP. Patient has an appointment with PCP on Wednesday and will bring results.

## 2018-11-30 NOTE — Telephone Encounter (Signed)
Copied from [place] [id number]. Topic: General - Other >> Nov 27, 2018  1:52 PM Leretha Pol wrote: Reason for CRM: [id number] Linward Foster, from [employer] [facility], called stating the quantiflo test for pt did come back positive stating that pt has moderate PAD. Please advise.

## 2018-11-30 NOTE — Telephone Encounter (Signed)
Options: She can discuss results with ordering provider or we can meet and discuss results as well as recommendations. I am going to need a copy of the results. Thanks, BJ

## 2018-12-03 ENCOUNTER — Other Ambulatory Visit: Payer: Self-pay | Admitting: Family Medicine

## 2018-12-05 ENCOUNTER — Ambulatory Visit (INDEPENDENT_AMBULATORY_CARE_PROVIDER_SITE_OTHER): Payer: Medicare Other | Admitting: Family Medicine

## 2018-12-05 ENCOUNTER — Other Ambulatory Visit: Payer: Self-pay

## 2018-12-05 ENCOUNTER — Encounter: Payer: Self-pay | Admitting: Family Medicine

## 2018-12-05 VITALS — BP 150/80 | HR 76 | Temp 98.4°F | Resp 16 | Ht 64.0 in | Wt 210.0 lb

## 2018-12-05 DIAGNOSIS — I10 Essential (primary) hypertension: Secondary | ICD-10-CM

## 2018-12-05 DIAGNOSIS — E118 Type 2 diabetes mellitus with unspecified complications: Secondary | ICD-10-CM

## 2018-12-05 DIAGNOSIS — I739 Peripheral vascular disease, unspecified: Secondary | ICD-10-CM | POA: Diagnosis not present

## 2018-12-05 DIAGNOSIS — E782 Mixed hyperlipidemia: Secondary | ICD-10-CM

## 2018-12-05 DIAGNOSIS — Z6836 Body mass index (BMI) 36.0-36.9, adult: Secondary | ICD-10-CM

## 2018-12-05 LAB — LIPID PANEL
Cholesterol: 133 mg/dL (ref 0–200)
HDL: 51.3 mg/dL (ref >39.00)
LDL Cholesterol: 62 mg/dL (ref 0–99)
NonHDL: 81.56
Total CHOL/HDL Ratio: 3
Triglycerides: 97 mg/dL (ref 0.0–149.0)
VLDL: 19.4 mg/dL (ref 0.0–40.0)

## 2018-12-05 LAB — COMPREHENSIVE METABOLIC PANEL
ALT: 16 U/L (ref 0–35)
AST: 14 U/L (ref 0–37)
Albumin: 4 g/dL (ref 3.5–5.2)
Alkaline Phosphatase: 73 U/L (ref 39–117)
BUN: 14 mg/dL (ref 6–23)
CO2: 29 mEq/L (ref 19–32)
Calcium: 9.4 mg/dL (ref 8.4–10.5)
Chloride: 104 mEq/L (ref 96–112)
Creatinine, Ser: 0.89 mg/dL (ref 0.40–1.20)
GFR: 75.49 mL/min (ref >60.00)
Glucose, Bld: 108 mg/dL — ABNORMAL HIGH (ref 70–99)
Potassium: 3.9 mEq/L (ref 3.5–5.1)
Sodium: 140 mEq/L (ref 135–145)
Total Bilirubin: 0.7 mg/dL (ref 0.2–1.2)
Total Protein: 6.7 g/dL (ref 6.0–8.3)

## 2018-12-05 LAB — MICROALBUMIN / CREATININE URINE RATIO
Creatinine,U: 191.6 mg/dL
Microalb Creat Ratio: 0.6 mg/g (ref 0.0–30.0)
Microalb, Ur: 1.2 mg/dL (ref 0.0–1.9)

## 2018-12-05 NOTE — Patient Instructions (Signed)
A few things to remember from today's visit:   Hypertension, essential, benign - Plan: CMP  Mixed hyperlipidemia - Plan: CMP, Lipid panel  Controlled type 2 diabetes mellitus with complication, without long-term current use of insulin (HCC) - Plan: CMP, Microalbumin / creatinine urine ratio  PAD (peripheral artery disease) (HCC) - Plan: VAS [place] ABI WITH/WO TBI  Today Lisinopril dose was increased from 10 mg to 20 mg. No changes in Hydrochlorothiazide. 15 min of daily walking as tolerated.   Please be sure medication list is accurate. If a new problem present, please set up appointment sooner than planned today.

## 2018-12-05 NOTE — Assessment & Plan Note (Signed)
No changes in current management, will follow labs done today and will give further recommendations accordingly.  

## 2018-12-05 NOTE — Progress Notes (Signed)
HPI:   Kelsey Conway is a 73 y.o. female, who is here today for chronic disease management.  She was last seen on 05/09/18.  DM II: Dx'ed in 2016. She is on non pharmacologic treatment. Denies abdominal pain, nausea,vomiting, polydipsia,polyuria, or polyphagia.   BS's 120's.  11/23/18 she had A1C done at home, health insurance home visit, it was 6.0.  Lab Results  Component Value Date   MICROALBUR <0.7 11/07/2017   HLD: She is on Atorvastatin 20 mg daily. Tolerating medication well.  Lab Results  Component Value Date   CHOL 132 01/30/2017   HDL 53.30 01/30/2017   LDLCALC 60 01/30/2017   TRIG 93.0 01/30/2017   CHOLHDL 2 01/30/2017   HTN: Denies severe/frequent headache, visual changes, chest pain, dyspnea, palpitation,focal weakness, or edema. She is on Lisinopril 10 mg daily and HCTZ 25 mg daily. She is not checking BP's at home.  BP elevated today and it was elevated during home visit. She thinks it is related with poor sleep. Husband snores loud.  Lab Results  Component Value Date   CREATININE 0.78 11/07/2017   BUN 14 11/07/2017   NA 141 11/07/2017   K 4.3 11/07/2017   CL 106 11/07/2017   CO2 31 11/07/2017   Left thigh numbness with prolonged standing, going on for over a year. It is intermittent.  In 2018 she received epidural injection.  Lower back pain, no radiated,also with prolonged standing. Stable.  She is exercising some dancing,leg exercises. She is trying to eat healthier.  Today she is concerned about "circulation screening" done during home visit, LE's with "moderate" disease. She denies claudication like symptoms, cold extremities,or cyanosis.  Review of Systems  Constitutional: Negative for activity change, appetite change, fatigue, fever and unexpected weight change.  HENT: Negative for mouth sores, nosebleeds and sore throat.   Eyes: Negative for redness and visual disturbance.  Respiratory: Negative for cough and  wheezing.   Gastrointestinal:       Negative for changes in bowel habits.  Genitourinary: Negative for decreased urine volume and hematuria.  Musculoskeletal: Negative for gait problem.  Skin: Negative for rash and wound.  Neurological: Negative for syncope, facial asymmetry and speech difficulty.  Psychiatric/Behavioral: Negative for confusion. The patient is nervous/anxious.   Rest of ROS, see pertinent positives sand negatives in HPI  Current Outpatient Medications on File Prior to Visit  Medication Sig Dispense Refill  . ACCU-CHEK AVIVA PLUS test strip USE AS DIRECTED 100 each 2  . Accu-Chek Softclix Lancets lancets USE TO TEST BLOOD SUGAR AS DIRECTED TWICE DAILY 100 each 2  . acetaminophen (TYLENOL) 500 MG tablet Take 1,000 mg by mouth every 6 (six) hours as needed for headache (pain).    . hydrochlorothiazide (HYDRODIURIL) 25 MG tablet TAKE 1 TABLET BY MOUTH  DAILY 90 tablet 3  . latanoprost (XALATAN) 0.005 % ophthalmic solution     . lisinopril (ZESTRIL) 10 MG tablet TAKE 1 TABLET BY MOUTH  DAILY 90 tablet 3  . atorvastatin (LIPITOR) 20 MG tablet TAKE 1 TABLET BY MOUTH  DAILY 90 tablet 3   No current facility-administered medications on file prior to visit.      Past Medical History:  Diagnosis Date  . Arthritis   . Chicken pox   . Diabetes mellitus without complication ([place])   . Family history of polyps in the colon   . Frequent headaches   . Glaucoma   . Heart murmur   . Hypertension  Allergies  Allergen Reactions  . Latex Rash    Powder latex    Social History   Socioeconomic History  . Marital status: Married    Spouse name: Not on file  . Number of children: Not on file  . Years of education: Not on file  . Highest education level: Not on file  Occupational History  . Not on file  Social Needs  . Financial resource strain: Not on file  . Food insecurity    Worry: Not on file    Inability: Not on file  . Transportation needs    Medical: Not on  file    Non-medical: Not on file  Tobacco Use  . Smoking status: Never Smoker  . Smokeless tobacco: Never Used  Substance and Sexual Activity  . Alcohol use: No  . Drug use: No  . Sexual activity: Not on file  Lifestyle  . Physical activity    Days per week: Not on file    Minutes per session: Not on file  . Stress: Not on file  Relationships  . Social Herbalist on phone: Not on file    Gets together: Not on file    Attends religious service: Not on file    Active member of club or organization: Not on file    Attends meetings of clubs or organizations: Not on file    Relationship status: Not on file  Other Topics Concern  . Not on file  Social History Narrative  . Not on file    Vitals:   12/05/18 1001  BP: (!) 150/80  Pulse: 76  Resp: 16  Temp: 98.4 F (36.9 C)   Body mass index is 36.05 kg/m.   Physical Exam  Nursing note and vitals reviewed. Constitutional: She is oriented to person, place, and time. She appears well-developed. No distress.  HENT:  Head: Normocephalic and atraumatic.  Mouth/Throat: Oropharynx is clear and moist and mucous membranes are normal.  Eyes: Pupils are equal, round, and reactive to light. Conjunctivae are normal.  Cardiovascular: Normal rate and regular rhythm.  No murmur heard. Pulses:      Dorsalis pedis pulses are 2+ on the right side and 2+ on the left side.       Posterior tibial pulses are 2+ on the right side and 2+ on the left side.  Respiratory: Effort normal and breath sounds normal. No respiratory distress.  GI: Soft. She exhibits no mass. There is no hepatomegaly. There is no abdominal tenderness.  Musculoskeletal:        General: No edema.  Lymphadenopathy:    She has no cervical adenopathy.  Neurological: She is alert and oriented to person, place, and time. She has normal strength. No cranial nerve deficit. Gait normal.  Skin: Skin is warm. No rash noted. No erythema.  Psychiatric: Her mood appears  anxious.  Well groomed, good eye contact.   ASSESSMENT AND PLAN:  [name] was seen today for chronic disease management.  Orders Placed This Encounter  Procedures  . CMP  . Microalbumin / creatinine urine ratio  . Lipid panel  . VAS [place] ABI WITH/WO TBI   Lab Results  Component Value Date   CHOL 133 12/05/2018   HDL 51.30 12/05/2018   LDLCALC 62 12/05/2018   TRIG 97.0 12/05/2018   CHOLHDL 3 12/05/2018   Lab Results  Component Value Date   CREATININE 0.89 12/05/2018   BUN 14 12/05/2018   NA 140 12/05/2018  K 3.9 12/05/2018   CL 104 12/05/2018   CO2 29 12/05/2018   Lab Results  Component Value Date   ALT 16 12/05/2018   AST 14 12/05/2018   ALKPHOS 73 12/05/2018   BILITOT 0.7 12/05/2018   Lab Results  Component Value Date   MICROALBUR 1.2 12/05/2018   MICROALBUR <0.7 11/07/2017    PAD (peripheral artery disease) (HCC) Peripheral pulses are present and no symptoms suggestive of significant PAD. Instructed about warning signs. ABI will be arranged.  Class 2 obesity with body mass index (BMI) of 36.0 to 36.9 in adult We discussed benefits of wt loss as well as adverse effects of obesity. Consistency with healthy diet and physical activity recommended. Daily brisk walking for 15 min as tolerated.   Controlled diabetes mellitus type 2 with complications ([place]) 123456 at goal. No changes in current management. Regular exercise and healthy diet with avoidance of added sugar food intake is an important part of treatment and recommended. Annual eye exam, periodic dental and foot care recommended. F/U in 5-6 months   Mixed hyperlipidemia No changes in current management, will follow labs done today and will give further recommendations accordingly.   Hypertension, essential, benign Not well controlled. Lisinopril dose increased from 10 mg to 20 mg, no changes in HCTZ. Monitor BP at home. Low salt diet recommended.  Possible complications of elevated  BP discussed.  F/U in 3 months.     Return in about 3 months (around 03/07/2019) for HTN,wt.        -Ms. [name] was advised to return sooner than planned today if new concerns arise.       Asanti Craigo G. [name], MD  [facility]. [place] office.

## 2018-12-05 NOTE — Telephone Encounter (Signed)
Sent to the pharmacy by e-scribe. Pt had lipid panel 12/05/2018.

## 2018-12-05 NOTE — Assessment & Plan Note (Signed)
Not well controlled. Lisinopril dose increased from 10 mg to 20 mg, no changes in HCTZ. Monitor BP at home. Low salt diet recommended.  Possible complications of elevated BP discussed.  F/U in 3 months.

## 2018-12-05 NOTE — Assessment & Plan Note (Signed)
HgA1C at goal. No changes in current management. Regular exercise and healthy diet with avoidance of added sugar food intake is an important part of treatment and recommended. Annual eye exam, periodic dental and foot care recommended. F/U in 5-6 months  

## 2018-12-05 NOTE — Assessment & Plan Note (Signed)
We discussed benefits of wt loss as well as adverse effects of obesity. Consistency with healthy diet and physical activity recommended. Daily brisk walking for 15 min as tolerated.  

## 2018-12-07 ENCOUNTER — Encounter (HOSPITAL_COMMUNITY): Payer: Medicare Other

## 2018-12-08 ENCOUNTER — Encounter: Payer: Self-pay | Admitting: Family Medicine

## 2018-12-19 ENCOUNTER — Encounter (HOSPITAL_COMMUNITY): Payer: Medicare Other

## 2018-12-19 DIAGNOSIS — H2513 Age-related nuclear cataract, bilateral: Secondary | ICD-10-CM | POA: Diagnosis not present

## 2018-12-19 DIAGNOSIS — H401134 Primary open-angle glaucoma, bilateral, indeterminate stage: Secondary | ICD-10-CM | POA: Diagnosis not present

## 2018-12-19 DIAGNOSIS — H52203 Unspecified astigmatism, bilateral: Secondary | ICD-10-CM | POA: Diagnosis not present

## 2018-12-19 DIAGNOSIS — E119 Type 2 diabetes mellitus without complications: Secondary | ICD-10-CM | POA: Diagnosis not present

## 2018-12-19 LAB — HM DIABETES EYE EXAM

## 2018-12-24 ENCOUNTER — Ambulatory Visit (HOSPITAL_COMMUNITY)
Admission: RE | Admit: 2018-12-24 | Discharge: 2018-12-24 | Disposition: A | Discharge status: Home / Self Care | Payer: Medicare Other | Source: Ambulatory Visit | Attending: Internal Medicine | Admitting: Internal Medicine

## 2018-12-24 ENCOUNTER — Other Ambulatory Visit: Payer: Self-pay

## 2018-12-24 ENCOUNTER — Other Ambulatory Visit (HOSPITAL_COMMUNITY): Payer: Self-pay | Admitting: Family Medicine

## 2018-12-24 DIAGNOSIS — I739 Peripheral vascular disease, unspecified: Secondary | ICD-10-CM | POA: Insufficient documentation

## 2018-12-25 ENCOUNTER — Encounter: Payer: Self-pay | Admitting: Family Medicine

## 2018-12-26 ENCOUNTER — Encounter: Payer: Self-pay | Admitting: Family Medicine

## 2019-01-10 ENCOUNTER — Other Ambulatory Visit: Payer: Self-pay

## 2019-01-10 ENCOUNTER — Ambulatory Visit
Admission: RE | Admit: 2019-01-10 | Discharge: 2019-01-10 | Disposition: A | Discharge status: Home / Self Care | Payer: Medicare Other | Source: Ambulatory Visit | Attending: Nurse Practitioner | Admitting: Nurse Practitioner

## 2019-01-10 DIAGNOSIS — Z1231 Encounter for screening mammogram for malignant neoplasm of breast: Secondary | ICD-10-CM | POA: Diagnosis not present

## 2019-01-16 NOTE — Progress Notes (Signed)
Patient scheduled for  03/08/2019 at 10 AM

## 2019-02-04 DIAGNOSIS — L249 Irritant contact dermatitis, unspecified cause: Secondary | ICD-10-CM | POA: Diagnosis not present

## 2019-02-04 DIAGNOSIS — I10 Essential (primary) hypertension: Secondary | ICD-10-CM | POA: Diagnosis not present

## 2019-02-16 DIAGNOSIS — R109 Unspecified abdominal pain: Secondary | ICD-10-CM | POA: Diagnosis not present

## 2019-02-16 DIAGNOSIS — Z79899 Other long term (current) drug therapy: Secondary | ICD-10-CM | POA: Diagnosis not present

## 2019-02-25 ENCOUNTER — Other Ambulatory Visit: Payer: Self-pay

## 2019-02-25 ENCOUNTER — Encounter: Payer: Self-pay | Admitting: Family Medicine

## 2019-02-25 ENCOUNTER — Ambulatory Visit (INDEPENDENT_AMBULATORY_CARE_PROVIDER_SITE_OTHER): Payer: Medicare Other | Admitting: Family Medicine

## 2019-02-25 VITALS — BP 130/80 | HR 74 | Temp 95.8°F | Resp 12 | Ht 64.0 in | Wt 212.0 lb

## 2019-02-25 DIAGNOSIS — K5900 Constipation, unspecified: Secondary | ICD-10-CM

## 2019-02-25 DIAGNOSIS — M654 Radial styloid tenosynovitis [de Quervain]: Secondary | ICD-10-CM

## 2019-02-25 DIAGNOSIS — R109 Unspecified abdominal pain: Secondary | ICD-10-CM

## 2019-02-25 DIAGNOSIS — G8929 Other chronic pain: Secondary | ICD-10-CM

## 2019-02-25 DIAGNOSIS — M5440 Lumbago with sciatica, unspecified side: Secondary | ICD-10-CM

## 2019-02-25 DIAGNOSIS — E1169 Type 2 diabetes mellitus with other specified complication: Secondary | ICD-10-CM

## 2019-02-25 LAB — URINALYSIS, ROUTINE W REFLEX MICROSCOPIC
Bilirubin Urine: NEGATIVE
Hgb urine dipstick: NEGATIVE
Ketones, ur: NEGATIVE
Leukocytes,Ua: NEGATIVE
Nitrite: NEGATIVE
RBC / HPF: NONE SEEN (ref 0–?)
Specific Gravity, Urine: 1.025 (ref 1.000–1.030)
Total Protein, Urine: NEGATIVE
Urine Glucose: NEGATIVE
Urobilinogen, UA: 0.2 (ref 0.0–1.0)
pH: 5.5 (ref 5.0–8.0)

## 2019-02-25 LAB — POCT GLYCOSYLATED HEMOGLOBIN (HGB A1C): Hemoglobin A1C: 6.2 % — AB (ref 4.0–5.6)

## 2019-02-25 NOTE — Assessment & Plan Note (Addendum)
Flank/back pain she reported could be an exacerbation of chronic back pain. Improved. F/U as needed.Marland Kitchen

## 2019-02-25 NOTE — Progress Notes (Signed)
ACUTE VISIT   HPI:  Chief Complaint  Patient presents with  . Abdominal Pain    patient complains of RLQ abdominal pain x9 days ago, states pain radiated to her back, seen at [facility], CT ordered due to suspected kidney stones, given Tramadol and patient cancelled    Kelsey Conway is a 74 y.o. female, who is here today with above concern. 9 days of right-sided back pain, she was evaluated at [facility] after a couple of days of sharp, 10/10, from lateral aspect of RUQ (waist level) to right side to back. She had nausea onset while waiting to be seen.  S/P cholecystomy.  Negative for fever, chills, night sweats, abdominal pain, vomiting, urinary symptoms, or skin rash. She states that she was constipated, this is a chronic problem.  Pain has greatly improved, yesterday pain was very mild and today she has no pain.  Negative for radiation to lower extremities, numbness, or tingling. According to patient, it was thought to be a kidney stone, abdominal CT were recommended. According to patient, back imaging was normal,she received "2 shots" during visit, she notes that one was for nausea; and she was told that urine dipstick was positive for blood. No history of nephrolithiasis. She was discharged on tramadol  Last bowel movement yesterday.  Started Metamucil a couple days ago and it seems to be helping.  -DM [id number] f/u in 10/2017. BS's 90- 110's,occasionally 130's. Negative for polydipsia,polyuria, or polyphagia.  Lab Results  Component Value Date   HGBA1C 6.4 11/07/2017  BS's 90-130's.  -She is also c/o left thumb and hand pain, intermittent. Stared after making her bed. No hx of trauma. She has not noted edema or erythema. No numbness or tingling. Pain is exacerbated by activities that involve thumb movement. Alleviated by rest. It is stable.  Right handed.  Review of Systems  Constitutional: Negative for activity change,  appetite change, fatigue and unexpected weight change.  HENT: Negative for mouth sores, nosebleeds and sore throat.   Eyes: Negative for redness and visual disturbance.  Respiratory: Negative for cough, shortness of breath and wheezing.   Cardiovascular: Negative for chest pain, palpitations and leg swelling.  Gastrointestinal: Negative for blood in stool.       Negative for changes in bowel habits.  Genitourinary: Negative for decreased urine volume and dysuria.  Skin: Negative for rash and wound.  Neurological: Negative for syncope, weakness and headaches.  Rest see pertinent positives and negatives per HPI.   Current Outpatient Medications on File Prior to Visit  Medication Sig Dispense Refill  . ACCU-CHEK AVIVA PLUS test strip USE AS DIRECTED 100 each 2  . Accu-Chek Softclix Lancets lancets USE TO TEST BLOOD SUGAR AS DIRECTED TWICE DAILY 100 each 2  . atorvastatin (LIPITOR) 20 MG tablet TAKE 1 TABLET BY MOUTH  DAILY 90 tablet 3  . hydrochlorothiazide (HYDRODIURIL) 25 MG tablet TAKE 1 TABLET BY MOUTH  DAILY (Patient taking differently: 12.5 mg. ) 90 tablet 3  . latanoprost (XALATAN) 0.005 % ophthalmic solution     . lisinopril (ZESTRIL) 10 MG tablet TAKE 1 TABLET BY MOUTH  DAILY (Patient taking differently: 40 mg. ) 90 tablet 3   No current facility-administered medications on file prior to visit.     Past Medical History:  Diagnosis Date  . Arthritis   . Chicken pox   . Diabetes mellitus without complication ([place])   . Family history of polyps in the colon   .  Frequent headaches   . Glaucoma   . Heart murmur   . Hypertension    Allergies  Allergen Reactions  . Latex Rash    Powder latex    Social History   Socioeconomic History  . Marital status: Married    Spouse name: Not on file  . Number of children: Not on file  . Years of education: Not on file  . Highest education level: Not on file  Occupational History  . Not on file  Tobacco Use  . Smoking status:  Never Smoker  . Smokeless tobacco: Never Used  Substance and Sexual Activity  . Alcohol use: No  . Drug use: No  . Sexual activity: Not on file  Other Topics Concern  . Not on file  Social History Narrative  . Not on file   Social Determinants of Health   Financial Resource Strain:   . Difficulty of Paying Living Expenses: Not on file  Food Insecurity:   . Worried About Charity fundraiser in the Last Year: Not on file  . Ran Out of Food in the Last Year: Not on file  Transportation Needs:   . Lack of Transportation (Medical): Not on file  . Lack of Transportation (Non-Medical): Not on file  Physical Activity:   . Days of Exercise per Week: Not on file  . Minutes of Exercise per Session: Not on file  Stress:   . Feeling of Stress : Not on file  Social Connections:   . Frequency of Communication with Friends and Family: Not on file  . Frequency of Social Gatherings with Friends and Family: Not on file  . Attends Religious Services: Not on file  . Active Member of Clubs or Organizations: Not on file  . Attends Archivist Meetings: Not on file  . Marital Status: Not on file    Vitals:   02/25/19 0943  BP: 130/80  Pulse: 74  Resp: 12  Temp: (!) 95.8 F (35.4 C)  SpO2: 98%   Wt Readings from Last 3 Encounters:  02/25/19 212 lb (96.2 kg)  12/05/18 210 lb (95.3 kg)  02/12/18 211 lb (95.7 kg)    Body mass index is 36.39 kg/m.   Physical Exam  Nursing note and vitals reviewed. Constitutional: She is oriented to person, place, and time. She appears well-developed. She does not appear ill. No distress.  HENT:  Head: Normocephalic and atraumatic.  Eyes: Conjunctivae and EOM are normal.  Cardiovascular: Normal rate.  No murmur (Not heard today.) heard. Pulses:      Dorsalis pedis pulses are 2+ on the right side and 2+ on the left side.  Respiratory: Effort normal and breath sounds normal. No respiratory distress.  GI: Soft. She exhibits no mass. There is  no hepatomegaly. There is no abdominal tenderness. There is no CVA tenderness.  Musculoskeletal:        General: No edema.     Lumbar back: No tenderness or bony tenderness.     Comments: Pain is not elicited with movement on exam table during examination. There is no tenderness upon palpation of radial styloid, no edema or erythema appreciated, no limitation of wrist ROM.  Mild pain elicited on radial styloid with Finkelstein maneuver. Pain upon palpation of left scaphoid, no deformity,edema,or erythema.  Lymphadenopathy:    She has no cervical adenopathy.  Neurological: She is alert and oriented to person, place, and time. She has normal strength. Gait normal.  Reflex Scores:  Patellar reflexes are 2+ on the right side and 2+ on the left side. SLR negative bilateral.  Skin: Skin is warm. No rash noted. No erythema.  Psychiatric: She has a normal mood and affect.  Well groomed, good eye contact.   ASSESSMENT AND PLAN:  Kelsey Conway was seen today for abdominal pain.  Diagnoses and all orders for this visit: Orders Placed This Encounter  Procedures  . Urinalysis, Routine w reflex microscopic  . POC HgB A1c   Lab Results  Component Value Date   HGBA1C 6.2 (A) 02/25/2019    Constipation, unspecified constipation type Problem seems to be chronic. Colonoscopy last time on 01/25/2015. Recommend adequate fiber and fluid intake. It seems like Metamucil is helping, so no changes.  Tenosynovitis, de Quervain Educated about diagnosis, prognosis, and treatment options. For now she prefers to try a splint,Rx for a forearm-based thumb spica. Recommend wearing splint for 4 to 6 weeks, if not better we need to consider Ortho/sports medicine evaluation.  Flank pain Great improvement. History is not typical presentation for kidney stone. A urine strainer given. ?  Musculoskeletal pain.  CT abdomen/pelvis in 01/2017 was negative for kidney stone but a 8 mm low density in left kidney seen  with little changes.  For now I do not think abdominal CT is needed, but needs to be considered if worsening pain. Further recommendation will be given according to your results.  Low back pain Flank/back pain she reported could be an exacerbation of chronic back pain. Improved. F/U as needed..  Type 2 diabetes mellitus with other specified complication ([place]) 123456 at goal. Continue nonpharmacologic treatment. Regular exercise and healthy diet with avoidance of added sugar food intake is an important part of treatment and recommended. Annual eye exam, periodic dental and foot care recommended. F/U in 5-6 months    Return in about 6 months (around 08/25/2019) for HTN,DM II.     G. [name], MD  [facility] [place] office.

## 2019-02-25 NOTE — Patient Instructions (Addendum)
A few things to remember from today's visit:   Chronic bilateral low back pain with sciatica, sciatica laterality unspecified  Flank pain - Plan: Urinalysis, Routine w reflex microscopic  Type 2 diabetes mellitus without complication, without long-term current use of insulin (HCC)  Constipation, unspecified constipation type   Left wrist splint with thumb support for 4-6 weeks. If still having left hand pain,we could arrange appt with ortho/sport medicine.  De Quervain's Tenosynovitis  De Quervain's tenosynovitis is a condition that causes inflammation of the tendon on the thumb side of the wrist. Tendons are cords of tissue that connect bones to muscles. The tendons in the hand pass through a tunnel called a sheath. A slippery layer of tissue (synovium) lets the tendons move smoothly in the sheath. With de Quervain's tenosynovitis, the sheath swells or thickens, causing friction and pain. The condition is also called de Quervain's disease and de Quervain's syndrome. It occurs most often in women who are 80-47 years old. What are the causes? The exact cause of this condition is not known. It may be associated with overuse of the hand and wrist. What increases the risk? You are more likely to develop this condition if you:  Use your hands far more than normal, especially if you repeat certain movements that involve twisting your hand or using a tight grip.  Are pregnant.  Are a middle-aged woman.  Have rheumatoid arthritis.  Have diabetes. What are the signs or symptoms? The main symptom of this condition is pain on the thumb side of the wrist. The pain may get worse when you grasp something or turn your wrist. Other symptoms may include:  Pain that extends up the forearm.  Swelling of your wrist and hand.  Trouble moving the thumb and wrist.  A sensation of snapping in the wrist.  A bump filled with fluid (cyst) in the area of the pain. How is this diagnosed? This  condition may be diagnosed based on:  Your symptoms and medical history.  A physical exam. During the exam, your health care provider may do a simple test Wynn Maudlin test) that involves pulling your thumb and wrist to see if this causes pain. You may also need to have an X-ray. How is this treated? Treatment for this condition may include:  Avoiding any activity that causes pain and swelling.  Taking medicines. Anti-inflammatory medicines and corticosteroid injections may be used to reduce inflammation and relieve pain.  Wearing a splint.  Having surgery. This may be needed if other treatments do not work. Once the pain and swelling has gone down:  Physical therapy. This includes stretching and strengthening exercises.  Occupational therapy. This includes adjusting how you move your wrist. Follow these instructions at home: If you have a splint:  Wear the splint as told by your health care provider. Remove it only as told by your health care provider.  Loosen the splint if your fingers tingle, become numb, or turn cold and blue.  Keep the splint clean.  If the splint is not waterproof: ? Do not let it get wet. ? Cover it with a watertight covering when you take a bath or a shower. Managing pain, stiffness, and swelling   Avoid movements and activities that cause pain and swelling in the wrist area.  If directed, put ice on the painful area. This may be helpful after doing activities that involve the sore wrist. ? Put ice in a plastic bag. ? Place a towel between your skin and the  bag. ? Leave the ice on for 20 minutes, 2-3 times a day.  Move your fingers often to avoid stiffness and to lessen swelling.  Raise (elevate) the injured area above the level of your heart while you are sitting or lying down. General instructions  Return to your normal activities as told by your health care provider. Ask your health care provider what activities are safe for you.  Take  over-the-counter and prescription medicines only as told by your health care provider.  Keep all follow-up visits as told by your health care provider. This is important. Contact a health care provider if:  Your pain medicine does not help.  Your pain gets worse.  You develop new symptoms. Summary  De Quervain's tenosynovitis is a condition that causes inflammation of the tendon on the thumb side of the wrist.  The condition occurs most often in women who are 78-19 years old.  The exact cause of this condition is not known. It may be associated with overuse of the hand and wrist.  Treatment starts with avoiding activity that causes pain or swelling in the wrist area. Other treatment may include wearing a splint and taking medicine. Sometimes, surgery is needed. This information is not intended to replace advice given to you by your health care provider. Make sure you discuss any questions you have with your health care provider. Document Revised: 07/13/2017 Document Reviewed: 12/19/2016 Elsevier Patient Education  [place].  Please be sure medication list is accurate. If a new problem present, please set up appointment sooner than planned today.

## 2019-02-25 NOTE — Assessment & Plan Note (Addendum)
HgA1C at goal. Continue non pharmacologic treatment. Regular exercise and healthy diet with avoidance of added sugar food intake is an important part of treatment and recommended. Annual eye exam, periodic dental and foot care recommended. F/U in 5-6 months. 

## 2019-03-04 ENCOUNTER — Encounter: Payer: Self-pay | Admitting: Family Medicine

## 2019-03-08 ENCOUNTER — Ambulatory Visit: Payer: Medicare Other | Admitting: Family Medicine

## 2019-03-14 ENCOUNTER — Encounter: Payer: Self-pay | Admitting: Family Medicine

## 2019-03-15 ENCOUNTER — Other Ambulatory Visit: Payer: Self-pay | Admitting: Family Medicine

## 2019-03-15 MED ORDER — LISINOPRIL 40 MG PO TABS: 40.0000 mg | ORAL_TABLET | Freq: Every day | ORAL | 1 refills | Status: DC

## 2019-03-21 ENCOUNTER — Telehealth (INDEPENDENT_AMBULATORY_CARE_PROVIDER_SITE_OTHER): Payer: Medicare Other | Admitting: Family Medicine

## 2019-03-21 ENCOUNTER — Other Ambulatory Visit: Payer: Self-pay

## 2019-03-21 ENCOUNTER — Encounter: Payer: Self-pay | Admitting: Family Medicine

## 2019-03-21 DIAGNOSIS — M545 Low back pain, unspecified: Secondary | ICD-10-CM

## 2019-03-21 DIAGNOSIS — K5900 Constipation, unspecified: Secondary | ICD-10-CM | POA: Diagnosis not present

## 2019-03-21 DIAGNOSIS — R1030 Lower abdominal pain, unspecified: Secondary | ICD-10-CM | POA: Diagnosis not present

## 2019-03-21 NOTE — Telephone Encounter (Signed)
  Pt still c/o pain. Per Dr.Jordan ov notes      "Flank pain Great improvement. History is not typical presentation for kidney stone. A urine strainer given. ?  Musculoskeletal pain.   CT abdomen/pelvis in 01/2017 was negative for kidney stone but a 8 mm low density in left kidney seen with little changes.   For now I do not think abdominal CT is needed, but needs to be considered if worsening pain. Further recommendation will be given according to your results."    Please advise

## 2019-03-21 NOTE — Telephone Encounter (Signed)
Pt has been scheduled for VV 

## 2019-03-21 NOTE — Progress Notes (Signed)
Virtual Visit via Video Note  I connected with Kelsey Conway  on 03/21/19 at 12:00 PM EST by a video enabled telemedicine application and verified that I am speaking with the correct person using two identifiers.  Location patient: home Location provider:work or home office Persons participating in the virtual visit: patient, provider, patient husband  I discussed the limitations of evaluation and management by telemedicine and the availability of in person appointments. The patient expressed understanding and agreed to proceed.   HPI:  Acute visit for   -per brief review of chart, saw PCP 02/25/19 for abd/back pain x 9 days, prior eval at [facility] per PCP notes -was resolved at that visit per provider notes -today reports had some back pain a few weeks ago and at [facility] was told it might be kidney stones, she was supposed to have a CT scan - but she cancelled it -she then saw her PCP and reports at that time was doing better and PCP did not think it was kidney stones as had neg labs/urine -reports she was fine until yesterday morning -symptoms yesterday and this morning included pain, sensation of pressure in the bilateral lower abdomen and bilateral lower back -denies: fevers, vomiting, blood in stools, diarrhea, dysuria, frequency or urgency, hematuria -no bowel movement yesterday or today, she admits she does have constipation from time to time -she has seen GI and had a number of CT scans for abd pain in the past, last done in 2019 -she has a hx of complete hysterectomy and benign abd mass per report -colonoscopy in 2016 per prior GI notes  ROS: See pertinent positives and negatives per HPI.  Past Medical History:  Diagnosis Date  . Arthritis   . Chicken pox   . Diabetes mellitus without complication ([place])   . Family history of polyps in the colon   . Frequent headaches   . Glaucoma   . Heart murmur   . Hypertension     Past Surgical History:  Procedure Laterality Date   . ABDOMINAL HYSTERECTOMY    . CHOLECYSTECTOMY    . TUBAL LIGATION    . 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 Diabetes Mother   . Hypertension Mother   . Hyperlipidemia Mother   . Diabetes Sister   . Hypertension Sister   . Diabetes Brother     SOCIAL HX: see hpi   Current Outpatient Medications:  .  ACCU-CHEK AVIVA PLUS test strip, USE AS DIRECTED, Disp: 100 each, Rfl: 2 .  Accu-Chek Softclix Lancets lancets, USE TO TEST BLOOD SUGAR AS DIRECTED TWICE DAILY, Disp: 100 each, Rfl: 2 .  atorvastatin (LIPITOR) 20 MG tablet, TAKE 1 TABLET BY MOUTH  DAILY, Disp: 90 tablet, Rfl: 3 .  hydrochlorothiazide (HYDRODIURIL) 25 MG tablet, TAKE 1 TABLET BY MOUTH  DAILY (Patient taking differently: 12.5 mg. ), Disp: 90 tablet, Rfl: 3 .  latanoprost (XALATAN) 0.005 % ophthalmic solution, , Disp: , Rfl:  .  lisinopril (ZESTRIL) 40 MG tablet, Take 1 tablet (40 mg total) by mouth daily., Disp: 90 tablet, Rfl: 1  EXAM:  VITALS per patient if applicable:  GENERAL: alert, oriented, appears well and in no acute distress  HEENT: atraumatic, conjunttiva clear, no obvious abnormalities on inspection of external nose and ears  NECK: normal movements of the head and neck  LUNGS: on inspection no signs of respiratory distress, breathing rate appears normal, no obvious gross  SOB, gasping or wheezing  CV: no obvious cyanosis  MS: moves all visible extremities without noticeable abnormality  ABD: had patient point to areas of concern - she pointed to bilateral low back and all across the lower abd, had her press and she reported some TTP in all areas, she was able to jump but she felt this was somewhat uncomfortable as well but not severe  PSYCH/NEURO: pleasant and cooperative, no obvious depression or anxiety, speech and thought processing grossly intact  ASSESSMENT AND PLAN: More than 30 minutes spent in total in caring for this  patient.  Discussed the following assessment and plan:  Lower abdominal pain  Acute bilateral low back pain without sciatica  Constipation, unspecified constipation type  -we discussed possible serious and likely etiologies, options for evaluation and workup, limitations of telemedicine visit vs in person visit, treatment, treatment risks and precautions. Pt prefers to treat via telemedicine empirically rather then risking or undertaking an in person visit at this moment. Query large bowel pathology - possible constipation vs other. She opted to try treating constipation with mirilax 1 capful in full glass of water 1-2 times daily for the next 3 days, then once daily until sees PCP since no BM in several days. Advised close PCP follow up early next week. Sent message to schedulers to assist and advised pt to call office if not contacted today. Advised if worsening or not resolved or recurrent she will need imaging and/or GI evaluation.  Patient agrees to seek prompt in person care if worsening, new symptoms arise or if is not improving over the weekend with treatment of constipation.   I discussed the assessment and treatment plan with the patient. The patient was provided an opportunity to ask questions and all were answered. The patient agreed with the plan and demonstrated an understanding of the instructions.   The patient was advised to call back or seek an in-person evaluation if the symptoms worsen or if the condition fails to improve as anticipated.   Lucretia Kern, DO

## 2019-03-22 DIAGNOSIS — R109 Unspecified abdominal pain: Secondary | ICD-10-CM | POA: Diagnosis not present

## 2019-03-22 DIAGNOSIS — M549 Dorsalgia, unspecified: Secondary | ICD-10-CM | POA: Diagnosis not present

## 2019-03-25 ENCOUNTER — Other Ambulatory Visit: Payer: Self-pay

## 2019-03-26 ENCOUNTER — Encounter: Payer: Self-pay | Admitting: Family Medicine

## 2019-03-26 ENCOUNTER — Ambulatory Visit (INDEPENDENT_AMBULATORY_CARE_PROVIDER_SITE_OTHER): Payer: Medicare Other | Admitting: Family Medicine

## 2019-03-26 VITALS — BP 132/80 | HR 67 | Resp 12 | Ht 64.0 in | Wt 212.5 lb

## 2019-03-26 DIAGNOSIS — M545 Low back pain, unspecified: Secondary | ICD-10-CM

## 2019-03-26 DIAGNOSIS — K5900 Constipation, unspecified: Secondary | ICD-10-CM | POA: Diagnosis not present

## 2019-03-26 LAB — BASIC METABOLIC PANEL
BUN: 15 mg/dL (ref 6–23)
CO2: 32 mEq/L (ref 19–32)
Calcium: 9.6 mg/dL (ref 8.4–10.5)
Chloride: 101 mEq/L (ref 96–112)
Creatinine, Ser: 0.91 mg/dL (ref 0.40–1.20)
GFR: 73.51 mL/min (ref >60.00)
Glucose, Bld: 114 mg/dL — ABNORMAL HIGH (ref 70–99)
Potassium: 3.9 mEq/L (ref 3.5–5.1)
Sodium: 140 mEq/L (ref 135–145)

## 2019-03-26 LAB — TSH: TSH: 2.98 u[IU]/mL (ref 0.35–4.50)

## 2019-03-26 LAB — CBC WITH DIFFERENTIAL/PLATELET
Basophils Absolute: 0 10*3/uL (ref 0.0–0.1)
Basophils Relative: 0.8 % (ref 0.0–3.0)
Eosinophils Absolute: 0.1 10*3/uL (ref 0.0–0.7)
Eosinophils Relative: 1.5 % (ref 0.0–5.0)
HCT: 39.8 % (ref 36.0–46.0)
Hemoglobin: 12.9 g/dL (ref 12.0–15.0)
Lymphocytes Relative: 39.8 % (ref 12.0–46.0)
Lymphs Abs: 2.4 10*3/uL (ref 0.7–4.0)
MCHC: 32.5 g/dL (ref 30.0–36.0)
MCV: 82.1 fl (ref 78.0–100.0)
Monocytes Absolute: 0.4 10*3/uL (ref 0.1–1.0)
Monocytes Relative: 6.2 % (ref 3.0–12.0)
Neutro Abs: 3.2 10*3/uL (ref 1.4–7.7)
Neutrophils Relative %: 51.7 % (ref 43.0–77.0)
Platelets: 329 10*3/uL (ref 150.0–400.0)
RBC: 4.85 Mil/uL (ref 3.87–5.11)
RDW: 15.4 % (ref 11.5–15.5)
WBC: 6.1 10*3/uL (ref 4.0–10.5)

## 2019-03-26 NOTE — Patient Instructions (Signed)
A few things to remember from today's visit:   Low back pain with radiation - Plan: CBC with Differential/Platelet, Basic metabolic panel, MR Lumbar Spine Wo Contrast  Constipation, unspecified constipation type - Plan: TSH  Benefiber 1 tsp 2 times per day. Miralax and Bisacodyl 5 mg at bedtime as needed.  We will arrange back MRI. Tramadol can aggravate constipation. Icy hot pach on area of pain may help.   Please be sure medication list is accurate. If a new problem present, please set up appointment sooner than planned today.

## 2019-03-26 NOTE — Progress Notes (Signed)
Chief Complaint  Patient presents with  . Back Pain  . Abdominal Pain    went to [facility], did x-ray & sonogram, both normal. given rx's for tramadol & robaxin. had bowel movements saturday & yesterday.    HPI:  Kelsey Conway is a 74 y.o. female, who is here today to follow on recent OV and ER visit.  Still having persistent low back pain. I saw her last on 02/25/19 after acute care visit because right-sided back/flank pain, now pain is bilateral. Sharp at times and pressure,constant pain, at times it is 10/10. Pain is exacerbated by certain movements and when she turns to either side in bed. It is alleviated by sitting down.  Pain radiates sometimes around her waist to suprapubic area. Back pain is not radiated to lower extremities. She has not noted numbness, tingling, or burning sensation. Negative for saddle anesthesia or changes in bowel/bladder function.  Currently she is on tramadol 50 mg 3 times daily and methocarbamol 500 mg. Pain is interfering with her sleep.  No hx of trauma or unusual physical activity. She has not noted fever, chills, abnormal weight loss, vomiting, dysuria, gross hematuria, increasing urine frequency, vaginal bleeding/discharge, or a skin rash. She had mild nausea 4 days ago.  Defecation seems to help with pain. Still having some constipation. She has been taking Metamucil and just started MiraLAX. Having bowel movements every 3 to 4 days, hard stools. She has not noted blood in the stool or melena.  Lab Results  Component Value Date   TSH 1.29 11/07/2017    DM II, BS's are "good", yesterday 113.  Review of Systems  Constitutional: Negative for appetite change and fatigue.  HENT: Negative for mouth sores, sore throat and trouble swallowing.   Respiratory: Negative for cough, shortness of breath and wheezing.   Cardiovascular: Negative for chest pain and leg swelling.  Gastrointestinal: Negative for abdominal pain, blood  in stool, nausea and vomiting.  Genitourinary: Negative for decreased urine volume and difficulty urinating.  Skin: Negative for color change and pallor.  Neurological: Negative for weakness and headaches.  Psychiatric/Behavioral: Positive for sleep disturbance. Negative for confusion. The patient is nervous/anxious.   Rest see pertinent positives and negatives per HPI.   Current Outpatient Medications on File Prior to Visit  Medication Sig Dispense Refill  . ACCU-CHEK AVIVA PLUS test strip USE AS DIRECTED 100 each 2  . Accu-Chek Softclix Lancets lancets USE TO TEST BLOOD SUGAR AS DIRECTED TWICE DAILY 100 each 2  . atorvastatin (LIPITOR) 20 MG tablet TAKE 1 TABLET BY MOUTH  DAILY 90 tablet 3  . hydrochlorothiazide (HYDRODIURIL) 25 MG tablet TAKE 1 TABLET BY MOUTH  DAILY (Patient taking differently: 12.5 mg. ) 90 tablet 3  . latanoprost (XALATAN) 0.005 % ophthalmic solution     . lisinopril (ZESTRIL) 40 MG tablet Take 1 tablet (40 mg total) by mouth daily. 90 tablet 1   No current facility-administered medications on file prior to visit.     Past Medical History:  Diagnosis Date  . Arthritis   . Chicken pox   . Diabetes mellitus without complication ([place])   . Family history of polyps in the colon   . Frequent headaches   . Glaucoma   . Heart murmur   . Hypertension    Allergies  Allergen Reactions  . Latex Rash    Powder latex    Social History   Socioeconomic History  . Marital status: Married  Spouse name: Not on file  . Number of children: Not on file  . Years of education: Not on file  . Highest education level: Not on file  Occupational History  . Not on file  Tobacco Use  . Smoking status: Never Smoker  . Smokeless tobacco: Never Used  Substance and Sexual Activity  . Alcohol use: No  . Drug use: No  . Sexual activity: Not on file  Other Topics Concern  . Not on file  Social History Narrative  . Not on file   Social Determinants of Health    Financial Resource Strain:   . Difficulty of Paying Living Expenses: Not on file  Food Insecurity:   . Worried About Charity fundraiser in the Last Year: Not on file  . Ran Out of Food in the Last Year: Not on file  Transportation Needs:   . Lack of Transportation (Medical): Not on file  . Lack of Transportation (Non-Medical): Not on file  Physical Activity:   . Days of Exercise per Week: Not on file  . Minutes of Exercise per Session: Not on file  Stress:   . Feeling of Stress : Not on file  Social Connections:   . Frequency of Communication with Friends and Family: Not on file  . Frequency of Social Gatherings with Friends and Family: Not on file  . Attends Religious Services: Not on file  . Active Member of Clubs or Organizations: Not on file  . Attends Archivist Meetings: Not on file  . Marital Status: Not on file    Vitals:   03/26/19 0721  BP: 132/80  Pulse: 67  Resp: 12  SpO2: 97%   Body mass index is 36.48 kg/m.   Physical Exam  Nursing note and vitals reviewed. Constitutional: She is oriented to person, place, and time. She appears well-developed. No distress.  HENT:  Head: Normocephalic and atraumatic.  Mouth/Throat: Oropharynx is clear and moist and mucous membranes are normal.  Eyes: Pupils are equal, round, and reactive to light. Conjunctivae are normal.  Cardiovascular: Normal rate and regular rhythm.  No murmur heard. Pulses:      Dorsalis pedis pulses are 2+ on the right side and 2+ on the left side.  Respiratory: Effort normal and breath sounds normal. No respiratory distress.  GI: Soft. She exhibits no mass. There is no hepatomegaly. There is no abdominal tenderness.  Musculoskeletal:        General: No edema.     Lumbar back: No tenderness or bony tenderness.     Comments: Lower back pain elicited with movement on examination table. Antalgic gait.  Lymphadenopathy:    She has no cervical adenopathy.  Neurological: She is alert  and oriented to person, place, and time. She has normal strength. No cranial nerve deficit.  Skin: Skin is warm. No rash noted. No erythema.  Psychiatric: She has a normal mood and affect.  Well groomed, good eye contact.    ASSESSMENT AND PLAN:  Kelsey Conway was seen today for back pain and abdominal pain.  Diagnoses and all orders for this visit:  Low back pain with radiation -     CBC with Differential/Platelet -     Basic metabolic panel -     MR Lumbar Spine Wo Contrast; Future  Constipation, unspecified constipation type -     TSH     Orders Placed This Encounter  Procedures  . MR Lumbar Spine Wo Contrast  . CBC with Differential/Platelet  .  Basic metabolic panel  . TSH    Lab Results  Component Value Date   TSH 2.98 03/26/2019   Lab Results  Component Value Date   CREATININE 0.91 03/26/2019   BUN 15 03/26/2019   NA 140 03/26/2019   K 3.9 03/26/2019   CL 101 03/26/2019   CO2 32 03/26/2019   Lab Results  Component Value Date   WBC 6.1 03/26/2019   HGB 12.9 03/26/2019   HCT 39.8 03/26/2019   MCV 82.1 03/26/2019   PLT 329.0 03/26/2019    Low back pain with radiation We discussed possible etiologies. According to patient, she had an abdominal US and back x-ray at [place] last week. Wt loss will help. I will go ahead and order a lumbar MRI. For now we will hold on abdominal imaging. We will try to obtain copy of imaging from [facility] acute care.  -     CBC with Differential/Platelet -     Basic metabolic panel -     MR Lumbar Spine Wo Contrast; Future  Constipation, unspecified constipation type Recommend continuing MiraLAX, stop Metamucil. Bisacodyl 5 mg daily at bedtime may also help. Adequate hydration and fiber intake. Benefiber 1 teaspoon twice daily may help. She reporting colonoscopy is current, will try to get copy.  Return in about 4 weeks (around 04/23/2019), or if symptoms worsen or fail to improve.   Alijah Akram G. [name], MD  [facility]. [place] office.

## 2019-03-28 ENCOUNTER — Encounter: Payer: Self-pay | Admitting: Family Medicine

## 2019-04-01 ENCOUNTER — Ambulatory Visit: Payer: Medicare Other

## 2019-04-15 ENCOUNTER — Ambulatory Visit: Payer: Medicare Other

## 2019-04-19 ENCOUNTER — Ambulatory Visit: Payer: Medicare Other | Attending: Internal Medicine

## 2019-04-19 DIAGNOSIS — Z23 Encounter for immunization: Secondary | ICD-10-CM

## 2019-04-19 NOTE — Progress Notes (Signed)
Covid-19 Vaccination Clinic  Name:  KARIM HAUFLER    MRN: [id number] DOB: January 17, 1946  04/19/2019  Ms. Cambridge was observed post Covid-19 immunization for 15 minutes without incident. She was provided with Vaccine Information Sheet and instruction to access the V-Safe system.   Ms. Jividen was instructed to call 911 with any severe reactions post vaccine: Marland Kitchen Difficulty breathing  . Swelling of face and throat  . A fast heartbeat  . A bad rash all over body  . Dizziness and weakness   Immunizations Administered    Name Date Dose VIS Date Route   Pfizer COVID-19 Vaccine 04/19/2019 11:12 AM 0.3 mL 01/04/2019 Intramuscular   Manufacturer: [employer], [employer]   Lot: [id number]   NDC: [address]-3

## 2019-04-23 ENCOUNTER — Ambulatory Visit: Payer: Medicare Other | Admitting: Family Medicine

## 2019-04-23 ENCOUNTER — Ambulatory Visit
Admission: RE | Admit: 2019-04-23 | Discharge: 2019-04-23 | Disposition: A | Discharge status: Home / Self Care | Payer: Medicare Other | Source: Ambulatory Visit | Attending: Family Medicine | Admitting: Family Medicine

## 2019-04-23 DIAGNOSIS — M48061 Spinal stenosis, lumbar region without neurogenic claudication: Secondary | ICD-10-CM | POA: Diagnosis not present

## 2019-04-23 DIAGNOSIS — M545 Low back pain, unspecified: Secondary | ICD-10-CM

## 2019-04-28 ENCOUNTER — Encounter: Payer: Self-pay | Admitting: Family Medicine

## 2019-04-29 ENCOUNTER — Encounter: Payer: Self-pay | Admitting: Family Medicine

## 2019-05-01 ENCOUNTER — Encounter: Payer: Self-pay | Admitting: Family Medicine

## 2019-05-03 ENCOUNTER — Telehealth (INDEPENDENT_AMBULATORY_CARE_PROVIDER_SITE_OTHER): Payer: Medicare Other | Admitting: Family Medicine

## 2019-05-03 ENCOUNTER — Encounter: Payer: Self-pay | Admitting: Family Medicine

## 2019-05-03 VITALS — BP 118/80 | Ht 64.0 in

## 2019-05-03 DIAGNOSIS — M545 Low back pain, unspecified: Secondary | ICD-10-CM

## 2019-05-03 DIAGNOSIS — H8110 Benign paroxysmal vertigo, unspecified ear: Secondary | ICD-10-CM

## 2019-05-03 NOTE — Telephone Encounter (Signed)
Left detailed message to call office to schedule virtual visit with Dr. [name] for today at 3:30 pm per Dr. [name].

## 2019-05-03 NOTE — Progress Notes (Signed)
Virtual Visit via Video Note  I connected with Kelsey Conway on 05/03/19 by a video enabled telemedicine application and verified that I am speaking with the correct person using two identifiers.  Location patient: home Location provider:work office Persons participating in the virtual visit: patient, provider  I discussed the limitations of evaluation and management by telemedicine and the availability of in person appointments. The patient expressed understanding and agreed to proceed.  Chief Complaint  Patient presents with  . Dizziness    had Covid vaccine on 04/19/19  . Nausea  . Fatigue  . Chills   HPI: Kelsey Conway is a 74 yo female with hx of HTN, chronic back pain,and OA among some who has sent messages through my chart with concerns about fatigue,dizziness,nausea,and "not feeling well." Sh thought symptoms were caused by COVID 19 vaccine.So recommend arranging visit.  A week of spinning sensation associated with nausea. Problem was exacerbated sometimes by movement in bed and, getting up,head movement during the day. Alleviated by rest/being still. No associated visual changes,CP,palpitations, SOB,abdominal pain,vomiting, changes in bowel habits, or focal weakness. Negative for fever, chills, sore throat, or changes in hearing.  She was evaluated at [facility] acute care on 04/30/2019, meclizine was recommended. She is feeling better now.  In regard to her lower back pain and lumbar MRI results, she states that she has not had any back pain since she had MRI done.  She is trying to avoid situations that cause pain. She does not have any other concerns today.  ROS: See pertinent positives and negatives per HPI.  Past Medical History:  Diagnosis Date  . Arthritis   . Chicken pox   . Diabetes mellitus without complication ([place])   . Family history of polyps in the colon   . Frequent headaches   . Glaucoma   . Heart murmur   . Hypertension     Past Surgical History:  Procedure  Laterality Date  . ABDOMINAL HYSTERECTOMY    . CHOLECYSTECTOMY    . TUBAL LIGATION    . 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 Diabetes Mother   . Hypertension Mother   . Hyperlipidemia Mother   . Diabetes Sister   . Hypertension Sister   . Diabetes Brother    Social History   Socioeconomic History  . Marital status: Married    Spouse name: Not on file  . Number of children: Not on file  . Years of education: Not on file  . Highest education level: Not on file  Occupational History  . Not on file  Tobacco Use  . Smoking status: Never Smoker  . Smokeless tobacco: Never Used  Substance and Sexual Activity  . Alcohol use: No  . Drug use: No  . Sexual activity: Not on file  Other Topics Concern  . Not on file  Social History Narrative  . Not on file   Social Determinants of Health   Financial Resource Strain:   . Difficulty of Paying Living Expenses:   Food Insecurity:   . Worried About Charity fundraiser in the Last Year:   . Arboriculturist in the Last Year:   Transportation Needs:   . Film/video editor (Medical):   Marland Kitchen Lack of Transportation (Non-Medical):   Physical Activity:   . Days of Exercise per Week:   . Minutes of Exercise per Session:   Stress:   . Feeling  of Stress :   Social Connections:   . Frequency of Communication with Friends and Family:   . Frequency of Social Gatherings with Friends and Family:   . Attends Religious Services:   . Active Member of Clubs or Organizations:   . Attends Archivist Meetings:   Marland Kitchen Marital Status:   Intimate Partner Violence:   . Fear of Current or Ex-Partner:   . Emotionally Abused:   Marland Kitchen Physically Abused:   . Sexually Abused:     Current Outpatient Medications:  .  ACCU-CHEK AVIVA PLUS test strip, USE AS DIRECTED, Disp: 100 each, Rfl: 2 .  Accu-Chek Softclix Lancets lancets, USE TO TEST BLOOD SUGAR AS DIRECTED TWICE DAILY, Disp: 100 each, Rfl:  2 .  atorvastatin (LIPITOR) 20 MG tablet, TAKE 1 TABLET BY MOUTH  DAILY, Disp: 90 tablet, Rfl: 3 .  hydrochlorothiazide (HYDRODIURIL) 25 MG tablet, TAKE 1 TABLET BY MOUTH  DAILY (Patient taking differently: 12.5 mg. ), Disp: 90 tablet, Rfl: 3 .  latanoprost (XALATAN) 0.005 % ophthalmic solution, , Disp: , Rfl:  .  lisinopril (ZESTRIL) 40 MG tablet, Take 1 tablet (40 mg total) by mouth daily., Disp: 90 tablet, Rfl: 1  EXAM:  VITALS per patient if applicable:BP 0000000   Ht 5\' 4"  (1.626 m)   BMI 36.48 kg/m   GENERAL: alert, oriented, appears well and in no acute distress  HEENT: atraumatic, conjunctiva clear, no obvious abnormalities on inspection.  LUNGS: on inspection no signs of respiratory distress, breathing rate appears normal, no obvious gross SOB, gasping or wheezing  CV: no obvious cyanosis  PSYCH/NEURO: pleasant and cooperative, no obvious depression or anxiety, speech and thought processing grossly intact  ASSESSMENT AND PLAN:  Discussed the following assessment and plan:  Benign paroxysmal positional vertigo, unspecified laterality We discussed diagnosis, prognosis, and treatment options. Problem has improved. Continue meclizine 25 mg twice daily as needed. Instructed about warning signs.  Low back pain with radiation Discussed lumbar MRI abnormalities,which could explain LE pain. Pain has resolved, so she would like to hold on orthopedist referral.  I discussed the assessment and treatment plan with the patient. Kelsey Conway was provided an opportunity to ask questions and all were answered. She agreed with the plan and demonstrated an understanding of the instructions.   Return if symptoms worsen or fail to improve, for Keep next f/u visit.Marland Kitchen   Dellamae Rosamilia [name], MD

## 2019-05-13 ENCOUNTER — Ambulatory Visit: Payer: Medicare Other | Admitting: Family Medicine

## 2019-05-13 ENCOUNTER — Ambulatory Visit: Payer: Medicare Other | Attending: Internal Medicine

## 2019-05-13 DIAGNOSIS — Z23 Encounter for immunization: Secondary | ICD-10-CM

## 2019-05-13 NOTE — Progress Notes (Signed)
Covid-19 Vaccination Clinic  Name:  Kelsey Conway    MRN: [id number] DOB: 13-Jan-1946  05/13/2019  Kelsey Conway was observed post Covid-19 immunization for 15 minutes without incident. She was provided with Vaccine Information Sheet and instruction to access the V-Safe system.   Kelsey Conway was instructed to call 911 with any severe reactions post vaccine: Marland Kitchen Difficulty breathing  . Swelling of face and throat  . A fast heartbeat  . A bad rash all over body  . Dizziness and weakness   Immunizations Administered    Name Date Dose VIS Date Route   Pfizer COVID-19 Vaccine 05/13/2019  3:46 PM 0.3 mL 03/20/2018 Intramuscular   Manufacturer: [employer], [employer]   Lot: [id number]   NDC: [address]-2

## 2019-06-09 ENCOUNTER — Emergency Department (HOSPITAL_COMMUNITY): Payer: Medicare Other

## 2019-06-09 ENCOUNTER — Encounter (HOSPITAL_COMMUNITY): Payer: Self-pay

## 2019-06-09 ENCOUNTER — Emergency Department (HOSPITAL_COMMUNITY)
Admission: EM | Admit: 2019-06-09 | Discharge: 2019-06-09 | Disposition: A | Discharge status: Home / Self Care | Payer: Medicare Other | Attending: Emergency Medicine | Admitting: Emergency Medicine

## 2019-06-09 ENCOUNTER — Other Ambulatory Visit: Payer: Self-pay

## 2019-06-09 DIAGNOSIS — E119 Type 2 diabetes mellitus without complications: Secondary | ICD-10-CM | POA: Insufficient documentation

## 2019-06-09 DIAGNOSIS — I10 Essential (primary) hypertension: Secondary | ICD-10-CM | POA: Insufficient documentation

## 2019-06-09 DIAGNOSIS — R1013 Epigastric pain: Secondary | ICD-10-CM | POA: Diagnosis not present

## 2019-06-09 DIAGNOSIS — R42 Dizziness and giddiness: Secondary | ICD-10-CM | POA: Insufficient documentation

## 2019-06-09 DIAGNOSIS — Z79899 Other long term (current) drug therapy: Secondary | ICD-10-CM | POA: Diagnosis not present

## 2019-06-09 DIAGNOSIS — R079 Chest pain, unspecified: Secondary | ICD-10-CM | POA: Diagnosis present

## 2019-06-09 DIAGNOSIS — E876 Hypokalemia: Secondary | ICD-10-CM | POA: Diagnosis not present

## 2019-06-09 DIAGNOSIS — Z91040 Latex allergy status: Secondary | ICD-10-CM | POA: Insufficient documentation

## 2019-06-09 LAB — HEPATIC FUNCTION PANEL
ALT: 23 U/L (ref 0–44)
AST: 21 U/L (ref 15–41)
Albumin: 3.6 g/dL (ref 3.5–5.0)
Alkaline Phosphatase: 67 U/L (ref 38–126)
Bilirubin, Direct: 0.1 mg/dL (ref 0.0–0.2)
Indirect Bilirubin: 0.6 mg/dL (ref 0.3–0.9)
Total Bilirubin: 0.7 mg/dL (ref 0.3–1.2)
Total Protein: 7 g/dL (ref 6.5–8.1)

## 2019-06-09 LAB — TROPONIN I (HIGH SENSITIVITY)
Troponin I (High Sensitivity): 3 ng/L (ref <18)
Troponin I (High Sensitivity): 4 ng/L (ref <18)

## 2019-06-09 LAB — BASIC METABOLIC PANEL
Anion gap: 8 (ref 5–15)
BUN: 13 mg/dL (ref 8–23)
CO2: 27 mmol/L (ref 22–32)
Calcium: 8.4 mg/dL — ABNORMAL LOW (ref 8.9–10.3)
Chloride: 107 mmol/L (ref 98–111)
Creatinine, Ser: 0.92 mg/dL (ref 0.44–1.00)
GFR calc Af Amer: >60 mL/min (ref >60)
GFR calc non Af Amer: >60 mL/min (ref >60)
Glucose, Bld: 119 mg/dL — ABNORMAL HIGH (ref 70–99)
Potassium: 3.3 mmol/L — ABNORMAL LOW (ref 3.5–5.1)
Sodium: 142 mmol/L (ref 135–145)

## 2019-06-09 LAB — CBC
HCT: 42 % (ref 36.0–46.0)
Hemoglobin: 13.2 g/dL (ref 12.0–15.0)
MCH: 26.8 pg (ref 26.0–34.0)
MCHC: 31.4 g/dL (ref 30.0–36.0)
MCV: 85.4 fL (ref 80.0–100.0)
Platelets: 343 10*3/uL (ref 150–400)
RBC: 4.92 MIL/uL (ref 3.87–5.11)
RDW: 15.3 % (ref 11.5–15.5)
WBC: 6.5 10*3/uL (ref 4.0–10.5)
nRBC: 0 % (ref 0.0–0.2)

## 2019-06-09 LAB — LIPASE, BLOOD: Lipase: 35 U/L (ref 11–51)

## 2019-06-09 MED ORDER — POTASSIUM CHLORIDE CRYS ER 20 MEQ PO TBCR
40.0000 meq | EXTENDED_RELEASE_TABLET | Freq: Once | ORAL | Status: AC
  Administered 2019-06-09: 40 meq via ORAL
  Filled 2019-06-09: qty 2

## 2019-06-09 MED ORDER — LIDOCAINE VISCOUS HCL 2 % MT SOLN
15.0000 mL | Freq: Once | OROMUCOSAL | Status: AC
  Administered 2019-06-09: 15 mL via ORAL
  Filled 2019-06-09: qty 15

## 2019-06-09 MED ORDER — SODIUM CHLORIDE 0.9% FLUSH: 3.0000 mL | Freq: Once | INTRAVENOUS | Status: DC

## 2019-06-09 MED ORDER — PANTOPRAZOLE SODIUM 20 MG PO TBEC
20.0000 mg | DELAYED_RELEASE_TABLET | Freq: Every day | ORAL | 0 refills | Status: DC
Start: 2019-06-09 — End: 2022-08-09

## 2019-06-09 MED ORDER — ALUM & MAG HYDROXIDE-SIMETH 200-200-20 MG/5ML PO SUSP
30.0000 mL | Freq: Once | ORAL | Status: AC
  Administered 2019-06-09: 30 mL via ORAL
  Filled 2019-06-09: qty 30

## 2019-06-09 NOTE — ED Triage Notes (Signed)
Patient reports that she began having upper abdominal pain at 0400 then it moved to her chest. Patient states she took Pepcid and had no relief. Patient also c/o slight dizziness.

## 2019-06-09 NOTE — Discharge Instructions (Signed)
Per our discussion, please follow-up with your primary care provider tomorrow to discuss this visit as well as your symptoms.  I would also recommend discussing your low potassium with your primary doctor.  They will likely want to supplement this.  If you develop any new or worsening symptoms please do not hesitate to return the emergency department.  It was a pleasure to meet you.

## 2019-06-09 NOTE — ED Provider Notes (Signed)
Medical screening examination/treatment/procedure(s) were conducted as a shared visit with non-physician practitioner(s) and myself.  I personally evaluated the patient during the encounter.  EKG Interpretation  Date/Time:  Sunday Jun 09 2019 09:52:54 EDT Ventricular Rate:  77 PR Interval:    QRS Duration: 100 QT Interval:  411 QTC Calculation: 466 R Axis:   30 Text Interpretation: Sinus rhythm Borderline T abnormalities, anterior leads 12 Lead; Mason-L[place]ar No significant change since last tracing Confirmed by Lacretia Leigh [phone]) on 06/09/2019 1:26:16 PM 74 year old female presents with epigastric pain rating to her left chest that occurred after she ate spicy and greasy food.  Symptoms were for several hours.  Work-up here including delta troponin and lipase negative.  Response with GI cocktail.  No concerning features for ACS.  Will discharge on PPI as well as Madelyn Brunner, MD 06/09/19 1334

## 2019-06-09 NOTE — ED Provider Notes (Signed)
[facility] DEPT Provider Note   CSN: [id number] Arrival date & time: 06/09/19  [id number]     History Chief Complaint  Patient presents with  . Chest Pain  . Abdominal Pain    Kelsey Conway is a 74 y.o. female.  HPI HPI Comments: Kelsey Conway is a 74 y.o. female who presents to the Emergency Department complaining of chest pain and abdominal pain.  Patient states last night she had a late meal at a restaurant.  She woke this morning around 4:30 AM and was experiencing some epigastric pain as well as a "fullness" that made her feel as if she wanted to vomit, noting she "could taste last night's dinner".  She was not nauseated.  Her pain then began spreading up her central chest.  Her pain was initially 10 out of 10 and she states it is now 9 out of 10.  She took some Pepcid this morning without significant relief.  She reports some associated lightheadedness.  She notes a history of similar symptoms but never this severity.  She confirms her listed medications.  She denies fevers, chills, URI symptoms, shortness of breath, nausea, vomiting, diarrhea, GU changes, syncope.      Past Medical History:  Diagnosis Date  . Arthritis   . Chicken pox   . Diabetes mellitus without complication ([place])   . Family history of polyps in the colon   . Frequent headaches   . Glaucoma   . Heart murmur   . Hypertension     Patient Active Problem List   Diagnosis Date Noted  . Constipation 02/25/2019  . Class 2 obesity with body mass index (BMI) of 36.0 to 36.9 in adult 05/09/2018  . Low back pain 10/04/2016  . Chest pain 05/10/2016  . Abnormal EKG 05/10/2016  . Murmur 05/10/2016  . Mixed hyperlipidemia 05/10/2016  . Hypertension, essential, benign 05/03/2016  . Insomnia 05/03/2016  . Type 2 diabetes mellitus with other specified complication ([place]) [id number]  . Generalized osteoarthritis of multiple sites 05/03/2016  . Gastric mass 11/05/2014    Past Surgical  History:  Procedure Laterality Date  . ABDOMINAL HYSTERECTOMY    . CHOLECYSTECTOMY    . TUBAL LIGATION    . TUMOR REMOVAL  2017   Stomach tumor     OB History   No obstetric history on file.     Family History  Problem Relation Age of Onset  . Diabetes Mother   . Hypertension Mother   . Hyperlipidemia Mother   . Diabetes Sister   . Hypertension Sister   . Diabetes Brother     Social History   Tobacco Use  . Smoking status: Never Smoker  . Smokeless tobacco: Never Used  Substance Use Topics  . Alcohol use: No  . Drug use: No    Home Medications Prior to Admission medications   Medication Sig Start Date End Date Taking? Authorizing Provider  ACCU-CHEK AVIVA PLUS test strip USE AS DIRECTED 04/24/18   [name], [name], MD  Accu-Chek Softclix Lancets lancets USE TO TEST BLOOD SUGAR AS DIRECTED TWICE DAILY 04/24/18   [name], [name], MD  atorvastatin (LIPITOR) 20 MG tablet TAKE 1 TABLET BY MOUTH  DAILY 12/05/18   Nafziger, Tommi Rumps, NP  hydrochlorothiazide (HYDRODIURIL) 25 MG tablet TAKE 1 TABLET BY MOUTH  DAILY Patient taking differently: 12.5 mg.  10/24/18   [name], [name], MD  latanoprost (XALATAN) 0.005 % ophthalmic solution  09/28/17   [provider]  lisinopril (ZESTRIL) 40 MG tablet Take 1 tablet (40 mg total) by mouth daily. 03/15/19   [name], [name], MD    Allergies    Latex  Review of Systems   Review of Systems  All other systems reviewed and are negative. Ten systems reviewed and are negative for acute change, except as noted in the HPI.   Physical Exam Updated Vital Signs BP (!) 163/93   Pulse 80   Temp 98.4 F (36.9 C) (Oral)   Resp 15   Ht 5\' 4"  (1.626 m)   Wt 95.7 kg   SpO2 94%   BMI 36.22 kg/m   Physical Exam Vitals and nursing note reviewed.  Constitutional:      General: She is not in acute distress.    Appearance: Normal appearance. She is well-developed and normal weight. She is not ill-appearing, toxic-appearing or diaphoretic.    HENT:     Head: Normocephalic and atraumatic.     Right Ear: External ear normal.     Left Ear: External ear normal.     Nose: Nose normal.     Mouth/Throat:     Mouth: Mucous membranes are moist.     Pharynx: Oropharynx is clear. No oropharyngeal exudate or posterior oropharyngeal erythema.  Eyes:     Extraocular Movements: Extraocular movements intact.     Pupils: Pupils are equal, round, and reactive to light.  Cardiovascular:     Rate and Rhythm: Normal rate and regular rhythm.     Pulses: Normal pulses.          Radial pulses are 2+ on the right side and 2+ on the left side.       Dorsalis pedis pulses are 2+ on the right side and 2+ on the left side.     Heart sounds: Normal heart sounds. Heart sounds not distant. No murmur. No systolic murmur. No diastolic murmur. No friction rub. No gallop.   Pulmonary:     Effort: Pulmonary effort is normal. No tachypnea, accessory muscle usage or respiratory distress.     Breath sounds: Normal breath sounds. No stridor. No decreased breath sounds, wheezing, rhonchi or rales.  Chest:     Chest wall: Tenderness present. No mass, deformity, crepitus or edema.     Comments: Moderate TTP noted over the sternum and left anterior chest wall.  No crepitus.  No visible signs of trauma. Abdominal:     General: Abdomen is flat.     Palpations: Abdomen is soft.     Tenderness: There is abdominal tenderness (Mild epigastric tenderness).  Musculoskeletal:        General: Normal range of motion.     Cervical back: Normal range of motion and neck supple. No tenderness.  Skin:    General: Skin is warm and dry.  Neurological:     General: No focal deficit present.     Mental Status: She is alert and oriented to person, place, and time.  Psychiatric:        Mood and Affect: Mood normal.        Behavior: Behavior normal.    ED Results / Procedures / Treatments   Labs (all labs ordered are listed, but only abnormal results are displayed) Labs  Reviewed  BASIC METABOLIC PANEL - Abnormal; Notable for the following components:      Result Value   Potassium 3.3 (*)    Glucose, Bld 119 (*)    Calcium 8.4 (*)    All other components within  normal limits  CBC  HEPATIC FUNCTION PANEL  LIPASE, BLOOD  TROPONIN I (HIGH SENSITIVITY)  TROPONIN I (HIGH SENSITIVITY)   EKG None  Radiology DG Chest 2 View  Result Date: 06/09/2019 CLINICAL DATA:  Patient reports that she began having upper abdominal pain at 0400 then it moved to her chest. Patient states she took Pepcid and had no relief. Patient also c/o slight dizziness. HTN EXAM: CHEST - 2 VIEW COMPARISON:  04/21/2016 FINDINGS: The heart size and mediastinal contours are within normal limits. Both lungs are clear. No pleural effusion or pneumothorax. The visualized skeletal structures are unremarkable. IMPRESSION: No active cardiopulmonary disease. Electronically Signed   By: Lajean Manes M.D.   On: 06/09/2019 11:10    Procedures Procedures   Medications Ordered in ED Medications  sodium chloride flush (NS) 0.9 % injection 3 mL (has no administration in time range)  potassium chloride SA (KLOR-CON) CR tablet 40 mEq (40 mEq Oral Given 06/09/19 1216)  alum & mag hydroxide-simeth (MAALOX/MYLANTA) 200-200-20 MG/5ML suspension 30 mL (30 mLs Oral Given 06/09/19 1216)    And  lidocaine (XYLOCAINE) 2 % viscous mouth solution 15 mL (15 mLs Oral Given 06/09/19 1216)   ED Course  I have reviewed the triage vital signs and the nursing notes.  Pertinent labs & imaging results that were available during my care of the patient were reviewed by me and considered in my medical decision making (see chart for details).    MDM Rules/Calculators/A&P                      12:09 PM patient is a 74 year old female who presents with epigastric and chest pain that started about 8 hours ago.  She states it started in her abdomen and began rising up her central chest.  Pain is reproducible with palpation.   She took Pepcid this morning without significant relief.  No history of similar symptoms.  Heart score is a 4.  Labs are generally reassuring.  She is mildly hypokalemic at 3.3.  She takes hydrochlorothiazide and does not take a potassium supplement.  Negative lipase.  No elevation in troponin.  Will give patient a GI cocktail.  Will give patient 40 mEq of K[place]-Con.  Will wait for repeat troponin.  12:58 PM her chest and abdominal pain have mildly alleviated since taking the GI cocktail.  Second troponin in process.  1:52 PM second troponin is negative.  I discussed this patient with my attending physician Dr. Lacretia Leigh who also evaluated the patient.  We both believe she is safe for discharge with strict PCP follow-up.  I discussed this plan with the patient and she was amenable.  I recommended that she discuss her hypokalemia with her primary care provider as well and have this supplemented since she is taking hydrochlorothiazide.  Will give her an rx for protonix. Her questions were answered and she was amicable at the time of discharge.  Will provide information on prevention of reflux symptoms in the future.  Vital signs stable the time of discharge.  Patient discharged to home/self care.  Condition at discharge: Stable  Note: Portions of this report may have been transcribed using voice recognition software. Every effort was made to ensure accuracy; however, inadvertent computerized transcription errors may be present.    Final Clinical Impression(s) / ED Diagnoses Final diagnoses:  Chest pain, unspecified type  Hypokalemia    Rx / DC Orders ED Discharge Orders  Ordered    pantoprazole (PROTONIX) 20 MG tablet  Daily     06/09/19 1359           Rayna Sexton, PA-C 06/09/19 1402    Lacretia Leigh, MD 06/11/19 2312

## 2019-07-07 ENCOUNTER — Other Ambulatory Visit: Payer: Self-pay

## 2019-07-07 ENCOUNTER — Observation Stay (HOSPITAL_COMMUNITY)
Admission: EM | Admit: 2019-07-07 | Discharge: 2019-07-08 | Disposition: A | Discharge status: Home / Self Care | Payer: Medicare Other | Attending: Family Medicine | Admitting: Family Medicine

## 2019-07-07 ENCOUNTER — Encounter (HOSPITAL_COMMUNITY): Payer: Self-pay | Admitting: Emergency Medicine

## 2019-07-07 ENCOUNTER — Emergency Department (HOSPITAL_COMMUNITY): Payer: Medicare Other

## 2019-07-07 DIAGNOSIS — I951 Orthostatic hypotension: Secondary | ICD-10-CM | POA: Diagnosis not present

## 2019-07-07 DIAGNOSIS — M545 Low back pain, unspecified: Secondary | ICD-10-CM | POA: Diagnosis present

## 2019-07-07 DIAGNOSIS — E1169 Type 2 diabetes mellitus with other specified complication: Secondary | ICD-10-CM | POA: Diagnosis present

## 2019-07-07 DIAGNOSIS — E119 Type 2 diabetes mellitus without complications: Secondary | ICD-10-CM | POA: Diagnosis not present

## 2019-07-07 DIAGNOSIS — H409 Unspecified glaucoma: Secondary | ICD-10-CM | POA: Insufficient documentation

## 2019-07-07 DIAGNOSIS — R42 Dizziness and giddiness: Secondary | ICD-10-CM | POA: Diagnosis present

## 2019-07-07 DIAGNOSIS — R270 Ataxia, unspecified: Principal | ICD-10-CM

## 2019-07-07 DIAGNOSIS — Z6836 Body mass index (BMI) 36.0-36.9, adult: Secondary | ICD-10-CM | POA: Diagnosis not present

## 2019-07-07 DIAGNOSIS — M159 Polyosteoarthritis, unspecified: Secondary | ICD-10-CM | POA: Diagnosis not present

## 2019-07-07 DIAGNOSIS — G4700 Insomnia, unspecified: Secondary | ICD-10-CM | POA: Insufficient documentation

## 2019-07-07 DIAGNOSIS — Z8249 Family history of ischemic heart disease and other diseases of the circulatory system: Secondary | ICD-10-CM | POA: Insufficient documentation

## 2019-07-07 DIAGNOSIS — I119 Hypertensive heart disease without heart failure: Secondary | ICD-10-CM | POA: Diagnosis not present

## 2019-07-07 DIAGNOSIS — K219 Gastro-esophageal reflux disease without esophagitis: Secondary | ICD-10-CM | POA: Diagnosis not present

## 2019-07-07 DIAGNOSIS — Z91040 Latex allergy status: Secondary | ICD-10-CM | POA: Diagnosis not present

## 2019-07-07 DIAGNOSIS — Z8349 Family history of other endocrine, nutritional and metabolic diseases: Secondary | ICD-10-CM | POA: Diagnosis not present

## 2019-07-07 DIAGNOSIS — Z20822 Contact with and (suspected) exposure to covid-19: Secondary | ICD-10-CM | POA: Diagnosis not present

## 2019-07-07 DIAGNOSIS — E669 Obesity, unspecified: Secondary | ICD-10-CM | POA: Diagnosis not present

## 2019-07-07 DIAGNOSIS — Z833 Family history of diabetes mellitus: Secondary | ICD-10-CM | POA: Diagnosis not present

## 2019-07-07 DIAGNOSIS — Z9049 Acquired absence of other specified parts of digestive tract: Secondary | ICD-10-CM | POA: Diagnosis not present

## 2019-07-07 DIAGNOSIS — Z79899 Other long term (current) drug therapy: Secondary | ICD-10-CM | POA: Insufficient documentation

## 2019-07-07 DIAGNOSIS — E782 Mixed hyperlipidemia: Secondary | ICD-10-CM | POA: Insufficient documentation

## 2019-07-07 DIAGNOSIS — K3189 Other diseases of stomach and duodenum: Secondary | ICD-10-CM | POA: Diagnosis present

## 2019-07-07 DIAGNOSIS — I10 Essential (primary) hypertension: Secondary | ICD-10-CM | POA: Diagnosis present

## 2019-07-07 LAB — BASIC METABOLIC PANEL
Anion gap: 11 (ref 5–15)
BUN: 18 mg/dL (ref 8–23)
CO2: 27 mmol/L (ref 22–32)
Calcium: 9.1 mg/dL (ref 8.9–10.3)
Chloride: 103 mmol/L (ref 98–111)
Creatinine, Ser: 1.11 mg/dL — ABNORMAL HIGH (ref 0.44–1.00)
GFR calc Af Amer: 57 mL/min — ABNORMAL LOW (ref >60)
GFR calc non Af Amer: 50 mL/min — ABNORMAL LOW (ref >60)
Glucose, Bld: 110 mg/dL — ABNORMAL HIGH (ref 70–99)
Potassium: 3.5 mmol/L (ref 3.5–5.1)
Sodium: 141 mmol/L (ref 135–145)

## 2019-07-07 LAB — CBC
HCT: 43.3 % (ref 36.0–46.0)
HCT: 40.2 % (ref 36.0–46.0)
Hemoglobin: 13.4 g/dL (ref 12.0–15.0)
Hemoglobin: 12.6 g/dL (ref 12.0–15.0)
MCH: 26.4 pg (ref 26.0–34.0)
MCH: 26.3 pg (ref 26.0–34.0)
MCHC: 30.9 g/dL (ref 30.0–36.0)
MCHC: 31.3 g/dL (ref 30.0–36.0)
MCV: 85.2 fL (ref 80.0–100.0)
MCV: 83.8 fL (ref 80.0–100.0)
Platelets: 367 10*3/uL (ref 150–400)
Platelets: 311 10*3/uL (ref 150–400)
RBC: 5.08 MIL/uL (ref 3.87–5.11)
RBC: 4.8 MIL/uL (ref 3.87–5.11)
RDW: 15.3 % (ref 11.5–15.5)
RDW: 15.1 % (ref 11.5–15.5)
WBC: 7.4 10*3/uL (ref 4.0–10.5)
WBC: 6.8 10*3/uL (ref 4.0–10.5)
nRBC: 0 % (ref 0.0–0.2)
nRBC: 0 % (ref 0.0–0.2)

## 2019-07-07 LAB — URINALYSIS, ROUTINE W REFLEX MICROSCOPIC
Bilirubin Urine: NEGATIVE
Glucose, UA: NEGATIVE mg/dL
Hgb urine dipstick: NEGATIVE
Ketones, ur: NEGATIVE mg/dL
Leukocytes,Ua: NEGATIVE
Nitrite: NEGATIVE
Protein, ur: NEGATIVE mg/dL
Specific Gravity, Urine: 1.019 (ref 1.005–1.030)
pH: 5 (ref 5.0–8.0)

## 2019-07-07 LAB — CREATININE, SERUM
Creatinine, Ser: 0.92 mg/dL (ref 0.44–1.00)
GFR calc Af Amer: >60 mL/min (ref >60)
GFR calc non Af Amer: >60 mL/min (ref >60)

## 2019-07-07 LAB — CBG MONITORING, ED: Glucose-Capillary: 100 mg/dL — ABNORMAL HIGH (ref 70–99)

## 2019-07-07 LAB — TROPONIN I (HIGH SENSITIVITY): Troponin I (High Sensitivity): 10 ng/L (ref <18)

## 2019-07-07 LAB — GLUCOSE, CAPILLARY: Glucose-Capillary: 120 mg/dL — ABNORMAL HIGH (ref 70–99)

## 2019-07-07 LAB — SARS CORONAVIRUS 2 BY RT PCR (HOSPITAL ORDER, PERFORMED IN ~~LOC~~ HOSPITAL LAB): SARS Coronavirus 2: NEGATIVE

## 2019-07-07 MED ORDER — SODIUM CHLORIDE 0.9% FLUSH: 3.0000 mL | Freq: Once | INTRAVENOUS | Status: DC

## 2019-07-07 MED ORDER — SODIUM CHLORIDE 0.9 % IV SOLN: INTRAVENOUS | Status: DC

## 2019-07-07 MED ORDER — INSULIN ASPART 100 UNIT/ML ~~LOC~~ SOLN
0.0000 [IU] | Freq: Three times a day (TID) | SUBCUTANEOUS | Status: DC
  Administered 2019-07-08: 2 [IU] via SUBCUTANEOUS

## 2019-07-07 MED ORDER — LISINOPRIL 20 MG PO TABS
40.0000 mg | ORAL_TABLET | Freq: Every day | ORAL | Status: DC
  Administered 2019-07-08: 40 mg via ORAL
  Filled 2019-07-07: qty 2

## 2019-07-07 MED ORDER — SENNOSIDES-DOCUSATE SODIUM 8.6-50 MG PO TABS: 1.0000 | ORAL_TABLET | Freq: Every evening | ORAL | Status: DC | PRN

## 2019-07-07 MED ORDER — ACETAMINOPHEN 160 MG/5ML PO SOLN: 650.0000 mg | ORAL | Status: DC | PRN

## 2019-07-07 MED ORDER — HYDRALAZINE HCL 10 MG PO TABS: 10.0000 mg | ORAL_TABLET | Freq: Four times a day (QID) | ORAL | Status: DC | PRN

## 2019-07-07 MED ORDER — ASPIRIN 325 MG PO TABS
325.0000 mg | ORAL_TABLET | Freq: Every day | ORAL | Status: DC
  Administered 2019-07-08: 325 mg via ORAL
  Filled 2019-07-07: qty 1

## 2019-07-07 MED ORDER — ACETAMINOPHEN 650 MG RE SUPP: 650.0000 mg | RECTAL | Status: DC | PRN

## 2019-07-07 MED ORDER — MECLIZINE HCL 25 MG PO TABS: 25.0000 mg | ORAL_TABLET | Freq: Three times a day (TID) | ORAL | Status: DC | PRN

## 2019-07-07 MED ORDER — ASPIRIN EC 325 MG PO TBEC
325.0000 mg | DELAYED_RELEASE_TABLET | Freq: Once | ORAL | Status: AC
  Administered 2019-07-07: 325 mg via ORAL
  Filled 2019-07-07: qty 1

## 2019-07-07 MED ORDER — ATORVASTATIN CALCIUM 20 MG PO TABS
20.0000 mg | ORAL_TABLET | Freq: Every day | ORAL | Status: DC
  Administered 2019-07-07 – 2019-07-08 (×2): 20 mg via ORAL
  Filled 2019-07-07 (×2): qty 1

## 2019-07-07 MED ORDER — PANTOPRAZOLE SODIUM 20 MG PO TBEC
20.0000 mg | DELAYED_RELEASE_TABLET | Freq: Every day | ORAL | Status: DC
  Administered 2019-07-07 – 2019-07-08 (×2): 20 mg via ORAL
  Filled 2019-07-07 (×2): qty 1

## 2019-07-07 MED ORDER — LATANOPROST 0.005 % OP SOLN
1.0000 [drp] | Freq: Every day | OPHTHALMIC | Status: DC
  Administered 2019-07-07: 1 [drp] via OPHTHALMIC
  Filled 2019-07-07: qty 2.5

## 2019-07-07 MED ORDER — TRAZODONE HCL 50 MG PO TABS
50.0000 mg | ORAL_TABLET | Freq: Every evening | ORAL | Status: DC
  Administered 2019-07-07: 50 mg via ORAL
  Filled 2019-07-07: qty 1

## 2019-07-07 MED ORDER — ACETAMINOPHEN 325 MG PO TABS: 650.0000 mg | ORAL_TABLET | ORAL | Status: DC | PRN

## 2019-07-07 MED ORDER — ENOXAPARIN SODIUM 40 MG/0.4ML ~~LOC~~ SOLN
40.0000 mg | SUBCUTANEOUS | Status: DC
  Administered 2019-07-07: 40 mg via SUBCUTANEOUS
  Filled 2019-07-07: qty 0.4

## 2019-07-07 MED ORDER — ONDANSETRON HCL 4 MG/2ML IJ SOLN: 4.0000 mg | Freq: Four times a day (QID) | INTRAMUSCULAR | Status: DC | PRN

## 2019-07-07 MED ORDER — INSULIN ASPART 100 UNIT/ML ~~LOC~~ SOLN: 0.0000 [IU] | Freq: Every day | SUBCUTANEOUS | Status: DC

## 2019-07-07 MED ORDER — STROKE: EARLY STAGES OF RECOVERY BOOK: Freq: Once | Status: DC

## 2019-07-07 NOTE — Consult Note (Signed)
[facility] Tele[facility]urology Consult Services  Stat Consult  Date of Service:   07/07/2019 15:58:02  Impression:       R50 - Dizziness/ Vertigo/ Giddiness  Comments/Sign-Out: 74 yr old woman, with positional dizziness for 1 week, exam non focal. NIH 0 CT head unremarkable. Diff Dx: peripheral , metabolic, versus r/o CVA. Rec: - Admit for r/o CVA - MRI Brain - Car [place] - ASA - Neuro follow up - CVA orders. Not alteplase or NIR candidate. as symptoms for 1 week. d/w pt, ER all of the above recs.  CT HEAD: Showed No Acute Hemorrhage or Acute Core Infarct  Metrics: [facility] Notification Time: 07/07/2019 15:54:46 Stamp Time: 07/07/2019 15:58:02 Callback Response Time: 07/07/2019 15:59:36  Our recommendations are outlined below.  Recommendations:       Antiplatelet Therapy       - Admit for r/o CVA       - MRI Brain       - Car [place]       - ASA       - Neuro follow up       - CVA orders.       Not alteplase or NIR candidate. as symptoms for 1 week.       d/w pt, ER all of the above recs.  Imaging Studies:       MRI Head       Carotid Dopplers       Echocardiogram - Transthoracic Echocardiogram  Therapies:       Physical Therapy, Occupational Therapy, Speech Therapy Assessment When Applicable  Other WorkUp:       Infectious/metabolic workup per primary team       Check B12 level  Disposition: Neurology Follow Up Recommended  Sign Out:       Discussed with Emergency Department Provider  ----------------------------------------------------------------------------------------------------  Chief Complaint: dizziness  History of Present Illness: Patient is a 74 year old Female.  74 yr old woman, with hx of HTN, reports dizziness, lightheadedness, low appetite, weakness for 1 week. Earlier this morning, she had chills. She denied headache, focal weakness, speech, vision changes.   Past Medical History:       Hypertension      Examination: BP(145/61), Pulse(57), Blood Glucose(110) 1A: Level of Consciousness - Alert; keenly responsive + 0 1B: Ask Month and Age - Both Questions Right + 0 1C: Blink Eyes & Squeeze Hands - Performs Both Tasks + 0 2: Test Horizontal Extraocular Movements - Normal + 0 3: Test Visual Fields - No Visual Loss + 0 4: Test Facial Palsy (Use Grimace if Obtunded) - Normal symmetry + 0 5A: Test Left Arm Motor Drift - No Drift for 10 Seconds + 0 5B: Test Right Arm Motor Drift - No Drift for 10 Seconds + 0 6A: Test Left Leg Motor Drift - No Drift for 5 Seconds + 0 6B: Test Right Leg Motor Drift - No Drift for 5 Seconds + 0 7: Test Limb Ataxia (FNF/Heel-Shin) - No Ataxia + 0 8: Test Sensation - Normal; No sensory loss + 0 9: Test Language/Aphasia - Normal; No aphasia + 0 10: Test Dysarthria - Normal + 0 11: Test Extinction/Inattention - No abnormality + 0  NIHSS Score: 0   Patient/Family was informed the Neurology Consult would occur via TeleHealth consult by way of interactive audio and video telecommunications and consented to receiving care in this manner.  Patient is being evaluated for possible acute neurologic impairment and high probability of imminent or  life-threatening deterioration. I spent total of 25 minutes providing care to this patient, including time for face to face visit via telemedicine, review of medical records, imaging studies and discussion of findings with providers, the patient and/or family.   Dr Lloyd Huger   [facility] [phone]  Case [id number]

## 2019-07-07 NOTE — ED Provider Notes (Signed)
[facility] DEPT Provider Note   CSN: [id number] Arrival date & time: 07/07/19  1116     History Chief Complaint  Patient presents with  . Dizziness  . Shortness of Breath  . multiple complaints    Kelsey Conway is a 74 y.o. female.  HPI Patient is a 74 year old female with a history as noted below.  Patient states for about 1 week she has been experiencing intermittent lightheadedness, nausea without vomiting, fatigue.  She states that she followed up with her PCP about this last week and was prescribed meclizine as well as Zofran.  She reports alleviation of her nausea with Zofran but denies significant alleviation of her lightheadedness with meclizine.  Few nights ago she notes she had an episode of "chills".  Patient denies any falls or head trauma.  She denies any chest pain, acute shortness of breath, leg swelling, abdominal pain, diarrhea, dysuria, syncope.     Past Medical History:  Diagnosis Date  . Arthritis   . Chicken pox   . Diabetes mellitus without complication ([place])   . Family history of polyps in the colon   . Frequent headaches   . Glaucoma   . Heart murmur   . Hypertension     Patient Active Problem List   Diagnosis Date Noted  . Constipation 02/25/2019  . Class 2 obesity with body mass index (BMI) of 36.0 to 36.9 in adult 05/09/2018  . Low back pain 10/04/2016  . Chest pain 05/10/2016  . Abnormal EKG 05/10/2016  . Murmur 05/10/2016  . Mixed hyperlipidemia 05/10/2016  . Hypertension, essential, benign 05/03/2016  . Insomnia 05/03/2016  . Type 2 diabetes mellitus with other specified complication ([place]) 03/50/0938  . Generalized osteoarthritis of multiple sites 05/03/2016  . Gastric mass 11/05/2014    Past Surgical History:  Procedure Laterality Date  . ABDOMINAL HYSTERECTOMY    . CHOLECYSTECTOMY    . TUBAL LIGATION    . TUMOR REMOVAL  2017   Stomach tumor    OB History   No obstetric history on file.     Family History  Problem Relation Age of Onset  . Diabetes Mother   . Hypertension Mother   . Hyperlipidemia Mother   . Diabetes Sister   . Hypertension Sister   . Diabetes Brother     Social History   Tobacco Use  . Smoking status: Never Smoker  . Smokeless tobacco: Never Used  Vaping Use  . Vaping Use: Never used  Substance Use Topics  . Alcohol use: No  . Drug use: No    Home Medications Prior to Admission medications   Medication Sig Start Date End Date Taking? Authorizing Provider  atorvastatin (LIPITOR) 20 MG tablet TAKE 1 TABLET BY MOUTH  DAILY Patient taking differently: Take 20 mg by mouth daily.  12/05/18  Yes Nafziger, Tommi Rumps, NP  hydrochlorothiazide (HYDRODIURIL) 25 MG tablet TAKE 1 TABLET BY MOUTH  DAILY Patient taking differently: Take 25 mg by mouth daily.  10/24/18  Yes [name], [name], MD  latanoprost (XALATAN) 0.005 % ophthalmic solution Place 1 drop into both eyes at bedtime.  09/28/17  Yes [provider]  lisinopril (ZESTRIL) 40 MG tablet Take 1 tablet (40 mg total) by mouth daily. 03/15/19  Yes [name], [name], MD  meclizine (ANTIVERT) 25 MG tablet Take 25 mg by mouth 3 (three) times daily as needed for nausea/vomiting. 07/05/19  Yes [provider]  ondansetron (ZOFRAN) 8 MG tablet Take 8 mg by  mouth 3 (three) times daily as needed for nausea/vomiting. 07/05/19  Yes [provider]  pantoprazole (PROTONIX) 20 MG tablet Take 1 tablet (20 mg total) by mouth daily. 06/09/19  Yes Rayna Sexton, PA-C  traZODone (DESYREL) 50 MG tablet Take 50 mg by mouth every evening. 07/05/19  Yes [provider]  Vitamin D, Ergocalciferol, (DRISDOL) 1.25 MG (50000 UNIT) CAPS capsule Take 50,000 Units by mouth once a week. 06/17/19  Yes [provider]  [place] test strip USE AS DIRECTED 04/24/18   [name], [name], MD  Accu-Chek Softclix Lancets lancets USE TO TEST BLOOD SUGAR AS DIRECTED TWICE DAILY 04/24/18   [name], [name],  MD    Allergies    Latex  Review of Systems   Review of Systems  All other systems reviewed and are negative. Ten systems reviewed and are negative for acute change, except as noted in the HPI.   Physical Exam Updated Vital Signs BP (!) 117/58   Pulse 63   Temp 98.5 F (36.9 C)   Resp 20   SpO2 94%   Physical Exam Vitals and nursing note reviewed.  Constitutional:      General: She is not in acute distress.    Appearance: Normal appearance. She is not ill-appearing, toxic-appearing or diaphoretic.  HENT:     Head: Normocephalic and atraumatic.     Right Ear: External ear normal.     Left Ear: External ear normal.     Nose: Nose normal.     Mouth/Throat:     Mouth: Mucous membranes are moist.     Pharynx: Oropharynx is clear. No oropharyngeal exudate or posterior oropharyngeal erythema.  Eyes:     Extraocular Movements: Extraocular movements intact.  Cardiovascular:     Rate and Rhythm: Normal rate and regular rhythm.     Pulses: Normal pulses.     Heart sounds: Normal heart sounds. No murmur heard.  No friction rub. No gallop.   Pulmonary:     Effort: Pulmonary effort is normal. No tachypnea, bradypnea, accessory muscle usage or respiratory distress.     Breath sounds: Normal breath sounds. No stridor. No decreased breath sounds, wheezing, rhonchi or rales.  Abdominal:     General: Abdomen is flat.     Tenderness: There is no abdominal tenderness.  Musculoskeletal:        General: Normal range of motion.     Cervical back: Normal range of motion and neck supple. No tenderness.     Right lower leg: No tenderness. No edema.     Left lower leg: No tenderness. No edema.  Skin:    General: Skin is warm and dry.  Neurological:     General: No focal deficit present.     Mental Status: She is alert and oriented to person, place, and time.     Comments: Patient is oriented to person, place, time.  She is speaking clearly and coherently.  She is speaking in clear and  complete sentences.  Extraocular movements intact.  Negative pronator drift.  Finger-to-nose intact bilaterally.  Strength is 5 out of 5 in the bilateral upper and lower extremities.  Distal sensation is intact in all 4 extremities.  Psychiatric:        Mood and Affect: Mood normal.        Behavior: Behavior normal.    ED Results / Procedures / Treatments   Labs (all labs ordered are listed, but only abnormal results are displayed) Labs Reviewed  BASIC METABOLIC PANEL - Abnormal; Notable for the following components:      Result Value   Glucose, Bld 110 (*)    Creatinine, Ser 1.11 (*)    GFR calc non Af Amer 50 (*)    GFR calc Af Amer 57 (*)    All other components within normal limits  CBG MONITORING, ED - Abnormal; Notable for the following components:   Glucose-Capillary 100 (*)    All other components within normal limits  CBC  URINALYSIS, ROUTINE W REFLEX MICROSCOPIC   EKG EKG Interpretation  Date/Time:  Sunday July 07 2019 11:27:13 EDT Ventricular Rate:  71 PR Interval:    QRS Duration: 100 QT Interval:  396 QTC Calculation: 431 R Axis:   41 Text Interpretation: Sinus rhythm Nonspecific T abnormalities, anterior leads 12 Lead; Mason-L[place]ar No acute changes Confirmed by Varney Biles [phone]) on 07/07/2019 1:54:00 PM  Radiology DG Chest 2 View  Result Date: 07/07/2019 CLINICAL DATA:  Shortness of breath EXAM: CHEST - 2 VIEW COMPARISON:  06/09/2019 FINDINGS: The heart size and mediastinal contours are within normal limits. Both lungs are clear. The visualized skeletal structures are unremarkable. Trachea midline.  Aorta atherosclerotic.  Remote cholecystectomy. IMPRESSION: No active cardiopulmonary disease. Electronically Signed   By: Jerilynn Mages.  Shick M.D.   On: 07/07/2019 12:02    Procedures Procedures   Medications Ordered in ED Medications - No data to display  ED Course  I have reviewed the triage vital signs and the nursing notes.  Pertinent labs & imaging results  that were available during my care of the patient were reviewed by me and considered in my medical decision making (see chart for details).  Clinical Course as of Jul 06 1533  Sun Jul 07, 2019  1413 Slightly elevated from baseline  Creatinine(!): 1.11 [LJ]    Clinical Course User Index [LJ] Rayna Sexton, PA-C   MDM Rules/Calculators/A&P                          Patient is a 74 year old female with a history of hypertension that presents due to fatigue, intermittent nausea, intermittent lightheadedness.  Patient is currently taking 25 mg of hydrochlorothiazide as well as 40 mg of lisinopril daily.  She states her doses were doubled about 1 month ago.  Since then she feels like she started experiencing her current symptoms.  She feels that they have worsened over the past week.  Her labs today are reassuring.  Her UA was benign.  CBC was benign.  BMP shows mild hyperglycemia at 110 with a slightly elevated creatinine of 1.11.  This could likely be secondary to her increase in lisinopril/HCTZ dosing.  Otherwise labs reassuring.  Vital signs are stable.  She is not tachycardic.  She is oxygenating between 95 and 97% while I am in the room.  Afebrile.  Chest x-ray is negative.  ECG reassuring.  I had the nursing staff obtain orthostatic vital signs which are noted below.  Patient was able to ambulate with a steady gait.  Orthostatic VS for the past 24 hrs:  BP- Lying Pulse- Lying BP- Sitting Pulse- Sitting BP- Standing at 0 minutes Pulse- Standing at 0 minutes  07/07/19 1441 130/61 51 134/83 63 143/71 61   I discussed this patient with my attending physician Dr. Bjorn Pippin. Will order a CT of the head as well as a teleneuro consult. It is the end of my shift and patient care will be transferred to  Fawze PA-C.    Patient discharged to home/self care.  Condition at discharge: Stable  Note: Portions of this report may have been transcribed using voice recognition software. Every effort  was made to ensure accuracy; however, inadvertent computerized transcription errors may be present.    Final Clinical Impression(s) / ED Diagnoses Final diagnoses:  Lightheadedness  Fatigue, unspecified type   Rx / DC Orders ED Discharge Orders    None       Rayna Sexton, PA-C 07/07/19 [place], MD 07/07/19 1614

## 2019-07-07 NOTE — ED Notes (Signed)
I sent a urine culture to main lab

## 2019-07-07 NOTE — H&P (Signed)
[facility] History and Physical  Kelsey Conway ZOX:[id number] DOB: [birth year] DOA: 07/07/2019  Referring physician: Michela Pitcher, PA PCP: Luciano Cutter, PA-C   Chief Complaint: dizziness  HPI: Kelsey Conway is a 74 y.o. female with PMH of HTN, DM2, GERD, presenting to the ED with almost 1 week of dizziness that has been persistent and progressively worsened. Her dizziness is present even when she is laying down but it is worse with sudden head movements. She has nausea without vomiting associated with this dizziness. She feels tired. She does not recall having similar symptoms in the past. She was seen by her PCP last week and was started on meclizine and Zofran for vertigo. She has been taking these medications but has not noticed much improvement. She had chills a few nights ago but no fever. She denies headaches or history of head trauma.   In the ED, she had head CT that was negative for acute process. CXR was negative for pneumonia. Her UA was unremarkable for UTI. Her labwork showed mildly elevated Scr of 1.1 with BUN of 18 and normal WBC and Hgb. Her exam was reported as non-focal with NIH score of zero. Her VS were remarkable for bradycardia with HR in the 50s, her BP fluctuated with SBP up to 160s but trended down on its own, her O2 sats were stable on RA.  Tele-Neurology was consulted by the ED provider and recommended admission to the hospital for work up for TIA/CVA. COVID-19 nasal swab test by PCR ordered but pending. The hospitalist was called for her admission to the hospital.    Review of Systems:  Constitutional:  No weight loss, night sweats, Fevers, +chills,+ fatigue.  HEENT:  No headaches, Difficulty swallowing,Tooth/dental problems,Sore throat,  No sneezing, itching, ear ache, nasal congestion, post nasal drip,  Cardio-vascular:  No chest pain, Orthopnea, PND, swelling in lower extremities, anasarca,+ dizziness, no palpitations  GI:  No heartburn, indigestion,  abdominal pain, vomiting, diarrhea, change in bowel habits, loss of appetite, +nausea Resp:  No shortness of breath with exertion or at rest. No excess mucus, no productive cough, No non-productive cough, No coughing up of blood.No change in color of mucus.No wheezing.No chest wall deformity  Skin:  no rash or lesions.  GU:  no dysuria, change in color of urine, no urgency or frequency. No flank pain.  Musculoskeletal:  No joint pain or swelling. No decreased range of motion. No back pain.  Psych:  No change in mood or affect. No depression or anxiety. No memory loss.   Past Medical History:  Diagnosis Date  . Arthritis   . Chicken pox   . Diabetes mellitus without complication (HCC)   . Family history of polyps in the colon   . Frequent headaches   . Glaucoma   . Heart murmur   . Hypertension    Past Surgical History:  Procedure Laterality Date  . ABDOMINAL HYSTERECTOMY    . CHOLECYSTECTOMY    . TUBAL LIGATION    . TUMOR REMOVAL  2017   Stomach tumor   Social History:  reports that she has never smoked. She has never used smokeless tobacco. She reports that she does not drink alcohol and does not use drugs.  Allergies  Allergen Reactions  . Latex Rash    Powder latex    Family History  Problem Relation Age of Onset  . Diabetes Mother   . Hypertension Mother   . Hyperlipidemia Mother   . Diabetes Sister   .  Hypertension Sister   . Diabetes Brother      Prior to Admission medications   Medication Sig Start Date End Date Taking? Authorizing Provider  atorvastatin (LIPITOR) 20 MG tablet TAKE 1 TABLET BY MOUTH  DAILY Patient taking differently: Take 20 mg by mouth daily.  12/05/18  Yes Nafziger, Kandee Keen, NP  hydrochlorothiazide (HYDRODIURIL) 25 MG tablet TAKE 1 TABLET BY MOUTH  DAILY Patient taking differently: Take 25 mg by mouth daily.  10/24/18  Yes [place], [name], MD  latanoprost (XALATAN) 0.005 % ophthalmic solution Place 1 drop into both eyes at bedtime.  09/28/17   Yes [provider]  lisinopril (ZESTRIL) 40 MG tablet Take 1 tablet (40 mg total) by mouth daily. 03/15/19  Yes [place], [name], MD  meclizine (ANTIVERT) 25 MG tablet Take 25 mg by mouth 3 (three) times daily as needed for nausea/vomiting. 07/05/19  Yes [provider]  ondansetron (ZOFRAN) 8 MG tablet Take 8 mg by mouth 3 (three) times daily as needed for nausea/vomiting. 07/05/19  Yes [provider]  pantoprazole (PROTONIX) 20 MG tablet Take 1 tablet (20 mg total) by mouth daily. 06/09/19  Yes Placido Sou, PA-C  traZODone (DESYREL) 50 MG tablet Take 50 mg by mouth every evening. 07/05/19  Yes [provider]  Vitamin D, Ergocalciferol, (DRISDOL) 1.25 MG (50000 UNIT) CAPS capsule Take 50,000 Units by mouth once a week. 06/17/19  Yes [provider]  ACCU-CHEK AVIVA PLUS test strip USE AS DIRECTED 04/24/18   [place], [name], MD  Accu-Chek Softclix Lancets lancets USE TO TEST BLOOD SUGAR AS DIRECTED TWICE DAILY 04/24/18   [place], [name], MD   Physical Exam: Vitals:   07/07/19 1632 07/07/19 1633 07/07/19 1634 07/07/19 1737  BP:    137/63  Pulse: 61 62 (!) 58 (!) 52  Resp:    18  Temp:      SpO2: 96% 96% 95% 93%    Wt Readings from Last 3 Encounters:  06/09/19 95.7 kg  03/26/19 96.4 kg  02/25/19 96.2 kg    General:  Appears calm and comfortable Eyes: PERRL, normal lids, irises & conjunctiva, no nystagmus noted ENT: MMM, no tongue deviation Neck: supple, nl ROM Cardiovascular: RRR,S1 and S2 present. Marland Kitchen Respiratory: Normal respiratory effort. No wheezing.  Abdomen: soft, ntnd Skin: no rash or induration seen on limited exam Musculoskeletal: grossly normal tone BUE/BLE Psychiatric: grossly normal mood and affect, speech fluent and appropriate Neurologic: No facial droop, A&O x3, able to move UE and LE voluntarily GU: No suprapubic tenderness, no CVA tenderness.          Labs on Admission:  Basic Metabolic Panel: Recent Labs  Lab  07/07/19 1141  NA 141  K 3.5  CL 103  CO2 27  GLUCOSE 110*  BUN 18  CREATININE 1.11*  CALCIUM 9.1   Liver Function Tests: No results for input(s): AST, ALT, ALKPHOS, BILITOT, PROT, ALBUMIN in the last 168 hours. No results for input(s): LIPASE, AMYLASE in the last 168 hours. No results for input(s): AMMONIA in the last 168 hours. CBC: Recent Labs  Lab 07/07/19 1141  WBC 7.4  HGB 13.4  HCT 43.3  MCV 85.2  PLT 367   Cardiac Enzymes: No results for input(s): CKTOTAL, CKMB, CKMBINDEX, TROPONINI in the last 168 hours.  BNP (last 3 results) No results for input(s): BNP in the last 8760 hours.  ProBNP (last 3 results) No results for input(s): PROBNP in the last 8760 hours.  CBG: Recent Labs  Lab 07/07/19 1130  GLUCAP 100*    Radiological Exams on Admission: DG Chest 2 View  Result Date: 07/07/2019 CLINICAL DATA:  Shortness of breath EXAM: CHEST - 2 VIEW COMPARISON:  06/09/2019 FINDINGS: The heart size and mediastinal contours are within normal limits. Both lungs are clear. The visualized skeletal structures are unremarkable. Trachea midline.  Aorta atherosclerotic.  Remote cholecystectomy. IMPRESSION: No active cardiopulmonary disease. Electronically Signed   By: Judie Petit.  Shick M.D.   On: 07/07/2019 12:02   CT Head Wo Contrast  Result Date: 07/07/2019 CLINICAL DATA:  74 year old female with lightheadedness, weakness, ataxia. EXAM: CT HEAD WITHOUT CONTRAST TECHNIQUE: Contiguous axial images were obtained from the base of the skull through the vertex without intravenous contrast. COMPARISON:  None. FINDINGS: Brain: Cerebral volume is within normal limits for age. No midline shift, ventriculomegaly, mass effect, evidence of mass lesion, intracranial hemorrhage or evidence of cortically based acute infarction. Gray-white matter differentiation is within normal limits throughout the brain. No convincing encephalomalacia. Vascular: Calcified atherosclerosis at the skull base. No  suspicious intracranial vascular hyperdensity. Skull: Negative. Sinuses/Orbits: Mild right maxillary sinus mucosal thickening. Other Visualized paranasal sinuses and mastoids are clear. Other: Visualized orbits and scalp soft tissues are within normal limits. IMPRESSION: Normal for age non contrast CT appearance of the brain. Electronically Signed   By: Odessa Fleming M.D.   On: 07/07/2019 16:13    EKG: Independently reviewed. NSR, HR of 71. No ST elevation or depression.   Assessment/Plan Principal Problem:   Ataxia Active Problems:   Hypertension, essential, benign   Insomnia   Type 2 diabetes mellitus with other specified complication (HCC)   Generalized osteoarthritis of multiple sites   Mixed hyperlipidemia   Gastric mass   Low back pain   Class 2 obesity with body mass index (BMI) of 36.0 to 36.9 in adult   1. Ataxia.  -Unclear etiology. Could be secondary to vertigo but she has risk factors for CVA. Per Neurology recommendations, will admit as Observation with telemetry.  -MRI head already ordered.  -Ordered: carotid Doppler, 2D echo, ASA, statin, lipid panel, HgbA1c, SLP/PT/OT -Ordered vitamin B12 and folate levels.  -Will continue BP control with home lisinopril, adding hydralazine as needed. Holding HCTZ due to possible dehydration.  -Orthostatic vitals requested.  -Neurology consulted by ED, will follow (may need to contact in am to verify that they will be following) -Gentle IV hydration due to elevated Scr but orthostatic vitals ordered prior to initiation of IV fluids.   2. Bradycardia. Sinus. Symptomatmic Unclear etiology. Reports that her PCP had noticed it recently and patient has appointment with Cardiology on 6/23 for this reason.   Patient reports not being on BB at home. Denies chest pain.  EKG with nl rate of 71.  -Will repeat EKG. Trend troponin. Monitor closely on telemetry. Consider Cardiology consultation. 2D echo ordered.   3. HTN. BP stable at this time. Will  continue lisinopril, hold HCTZ due to possible dehydration, adding hydralazine po as needed. She presents with >1 week of symptoms of ataxia so does not qualify for permissive hypertension.   4. DM2. Check HgbA1c, start SSI, low dose achs.   5. Hyperlipidemia. Continue statin, may need to change it pending dx work up for CVA.   6. OA. Continue tylenol as needed for pain.   7. Obesity with BMI greater tan 36. Increases her risk of uncontrolled HTN and DM2.     Code Status: Full code DVT Prophylaxis: Lovenox [place] Family Communication: Discussed with  the patient Disposition Plan: Admit as Observation with anticipated less than 2 MN stay.   Time spent: 70 minutes  Ky Barban [facility]

## 2019-07-07 NOTE — Discharge Instructions (Addendum)
Please follow-up with your primary care provider tomorrow morning to discuss your blood pressure medication regimen.  If your symptoms worsen please do not hesitate to return to the emergency department for reevaluation.  It was a pleasure to meet you.

## 2019-07-07 NOTE — ED Notes (Signed)
Patient walked down the hallway and stated she felt "light-headed"

## 2019-07-07 NOTE — ED Notes (Signed)
Called to give report, no answer at this time.  

## 2019-07-07 NOTE — ED Notes (Signed)
Pt taken to MRI  

## 2019-07-07 NOTE — ED Provider Notes (Signed)
Received patient at signout from [facility].  Refer to provider note for full history and physical examination.  Briefly patient is a 74 year old female with history of hypertension, hyperlipidemia, diabetes presenting for evaluation of progressively worsening dizziness for 1 week.  She is now experiencing symptoms constantly even at rest.  Has had some associated nausea and fatigue.  Has had improvement with Zofran.  Pending noncontrast head CT and teleneurology consultation and recommendations.   ED Course/Procedures   Clinical Course as of Jul 06 1548  Sun Jul 07, 2019  1413 Slightly elevated from baseline  Creatinine(!): 1.11 [LJ]    Clinical Course User Index [LJ] Rayna Sexton, PA-C    Procedures  MDM  DG Chest 2 View  Result Date: 07/07/2019 CLINICAL DATA:  Shortness of breath EXAM: CHEST - 2 VIEW COMPARISON:  06/09/2019 FINDINGS: The heart size and mediastinal contours are within normal limits. Both lungs are clear. The visualized skeletal structures are unremarkable. Trachea midline.  Aorta atherosclerotic.  Remote cholecystectomy. IMPRESSION: No active cardiopulmonary disease. Electronically Signed   By: Jerilynn Mages.  Shick M.D.   On: 07/07/2019 12:02   CT Head Wo Contrast  Result Date: 07/07/2019 CLINICAL DATA:  74 year old female with lightheadedness, weakness, ataxia. EXAM: CT HEAD WITHOUT CONTRAST TECHNIQUE: Contiguous axial images were obtained from the base of the skull through the vertex without intravenous contrast. COMPARISON:  None. FINDINGS: Brain: Cerebral volume is within normal limits for age. No midline shift, ventriculomegaly, mass effect, evidence of mass lesion, intracranial hemorrhage or evidence of cortically based acute infarction. Gray-white matter differentiation is within normal limits throughout the brain. No convincing encephalomalacia. Vascular: Calcified atherosclerosis at the skull base. No suspicious intracranial vascular hyperdensity. Skull: Negative.  Sinuses/Orbits: Mild right maxillary sinus mucosal thickening. Other Visualized paranasal sinuses and mastoids are clear. Other: Visualized orbits and scalp soft tissues are within normal limits. IMPRESSION: Normal for age non contrast CT appearance of the brain. Electronically Signed   By: Genevie Irlene M.D.   On: 07/07/2019 16:13   MR Brain Wo Contrast (neuro protocol)  Result Date: 07/07/2019 CLINICAL DATA:  74 year old female with dizziness, ataxia. Shortness of breath for 1 week. EXAM: MRI HEAD WITHOUT CONTRAST TECHNIQUE: Multiplanar, multiecho pulse sequences of the brain and surrounding structures were obtained without intravenous contrast. COMPARISON:  Head CT earlier today. FINDINGS: Brain: No restricted diffusion to suggest acute infarction. No midline shift, mass effect, evidence of mass lesion, ventriculomegaly, extra-axial collection or acute intracranial hemorrhage. Cervicomedullary junction and pituitary are within normal limits. No cortical encephalomalacia identified. There is evidence of a solitary chronic microhemorrhage in the left posterior temporal lobe white matter on series 10, image 23. No other chronic blood products identified. Widely scattered although fairly mild or at most moderate for age small subcortical white matter T2 and FLAIR hyperintense foci in both hemispheres. The deep gray matter nuclei, brainstem, and cerebellum appear normal. Vascular: Major intracranial vascular flow voids are preserved. The right vertebral artery appears dominant. Skull and upper cervical spine: Normal for age visible cervical spine. Visualized bone marrow signal is within normal limits. Sinuses/Orbits: Negative orbits. Paranasal sinus mucosal thickening most pronounced in the right maxillary sinus. Other: Mastoids are clear. Visible internal auditory structures appear normal. Normal stylomastoid foramina. Scalp and face soft tissues appear negative. IMPRESSION: 1. No acute intracranial abnormality. 2.  Mild to moderate for age nonspecific cerebral white matter signal changes. These are most commonly due to chronic small vessel disease, and there is a solitary chronic microhemorrhage in the left  temporal lobe white matter. Electronically Signed   By: Genevie Xiadani M.D.   On: 07/07/2019 18:41    4:40 PM Spoke with Dr. Reesa Chew with teleneurology who recommends giving aspirin and admitting for stroke work-up.  Noncontrast head CT unremarkable.  Spoke with Dr. Hayes Ludwig with hospitalist service who agrees to assume care of patient and bring her into the hospital for further evaluation and management at this time.     Renita Papa, PA-C 07/07/19 [place], [place], MD 07/09/19 [phone]

## 2019-07-07 NOTE — ED Triage Notes (Signed)
Pt reports since last weekend having lightheadedness, shob with exertion, chills at night. Reports went to her PCP on Friday and started her on new medications to help with nausea, dizziness and not able to sleep. Also states that she doesn't have much of an appetite and fatigue.

## 2019-07-07 NOTE — ED Notes (Signed)
Patient transported to CT 

## 2019-07-08 ENCOUNTER — Observation Stay (HOSPITAL_BASED_OUTPATIENT_CLINIC_OR_DEPARTMENT_OTHER): Payer: Medicare Other

## 2019-07-08 DIAGNOSIS — R270 Ataxia, unspecified: Secondary | ICD-10-CM | POA: Diagnosis not present

## 2019-07-08 DIAGNOSIS — G4700 Insomnia, unspecified: Secondary | ICD-10-CM

## 2019-07-08 DIAGNOSIS — G459 Transient cerebral ischemic attack, unspecified: Secondary | ICD-10-CM

## 2019-07-08 DIAGNOSIS — I10 Essential (primary) hypertension: Secondary | ICD-10-CM

## 2019-07-08 DIAGNOSIS — E782 Mixed hyperlipidemia: Secondary | ICD-10-CM

## 2019-07-08 DIAGNOSIS — E1169 Type 2 diabetes mellitus with other specified complication: Secondary | ICD-10-CM

## 2019-07-08 DIAGNOSIS — M159 Polyosteoarthritis, unspecified: Secondary | ICD-10-CM | POA: Diagnosis not present

## 2019-07-08 DIAGNOSIS — Z6836 Body mass index (BMI) 36.0-36.9, adult: Secondary | ICD-10-CM

## 2019-07-08 LAB — LIPID PANEL
Cholesterol: 139 mg/dL (ref 0–200)
HDL: 41 mg/dL (ref >40)
LDL Cholesterol: 79 mg/dL (ref 0–99)
Total CHOL/HDL Ratio: 3.4 RATIO
Triglycerides: 93 mg/dL (ref <150)
VLDL: 19 mg/dL (ref 0–40)

## 2019-07-08 LAB — GLUCOSE, CAPILLARY
Glucose-Capillary: 112 mg/dL — ABNORMAL HIGH (ref 70–99)
Glucose-Capillary: 152 mg/dL — ABNORMAL HIGH (ref 70–99)

## 2019-07-08 LAB — TROPONIN I (HIGH SENSITIVITY): Troponin I (High Sensitivity): 7 ng/L (ref <18)

## 2019-07-08 LAB — HEMOGLOBIN A1C
Hgb A1c MFr Bld: 6.8 % — ABNORMAL HIGH (ref 4.8–5.6)
Mean Plasma Glucose: 148.46 mg/dL

## 2019-07-08 LAB — VITAMIN B12: Vitamin B-12: 387 pg/mL (ref 180–914)

## 2019-07-08 LAB — ECHOCARDIOGRAM COMPLETE
Height: 64 in
Weight: 3358.05 oz

## 2019-07-08 LAB — FOLATE: Folate: 10.7 ng/mL (ref >5.9)

## 2019-07-08 MED ORDER — LISINOPRIL 40 MG PO TABS: 20.0000 mg | ORAL_TABLET | Freq: Every day | ORAL | 1 refills | Status: DC

## 2019-07-08 MED ORDER — HYDROCHLOROTHIAZIDE 25 MG PO TABS: 12.5000 mg | ORAL_TABLET | Freq: Every day | ORAL | Status: DC

## 2019-07-08 NOTE — Evaluation (Signed)
Physical Therapy One Time Evaluation Patient Details Name: Kelsey Conway MRN: [id number] DOB: 05-Jun-1945 Today's Date: 07/08/2019   History of Present Illness  74 year old female comes in a chief complaint of dizziness.  PMHx significant for HTN, glaucoma, diabtetes  Clinical Impression  Patient evaluated by Physical Therapy with no further acute PT needs identified. All education has been completed and the patient has no further questions.  See below for any follow-up Physical Therapy or equipment needs. PT is signing off. Thank you for this referral. Pt reports dizziness started after change to BP medication.  Pt reports dizziness with positional changes.  Orthostatics obtained and documented.  Pt ambulated within room and BP remained low, 82/57 mmHg and HR 90.  Further mobility deferred due to low BPs.  Pt educated to take frequent seated rest breaks at home, and pt agreeable to no further acute PT needs at this time.    07/08/19 1043  Vital Signs  Pulse Rate 70  BP 110/61  BP Location Left Arm  BP Method Automatic  Patient Position (if appropriate) Orthostatic Vitals  Orthostatic Lying   BP- Lying 110/61  Pulse- Lying 70  Orthostatic Sitting  BP- Sitting 99/59  Pulse- Sitting 74  Orthostatic Standing at 0 minutes  BP- Standing at 0 minutes (!) 88/56  Pulse- Standing at 0 minutes 79  Orthostatic Standing at 3 minutes  BP- Standing at 3 minutes 92/65  Pulse- Standing at 3 minutes 87      Follow Up Recommendations No PT follow up    Equipment Recommendations  None recommended by PT    Recommendations for Other Services       Precautions / Restrictions Precautions Precautions: Fall Precaution Comments: low BP Restrictions Weight Bearing Restrictions: No      Mobility  Bed Mobility Overal bed mobility: Modified Independent                Transfers Overall transfer level: Modified independent                  Ambulation/Gait Ambulation/Gait  assistance: Supervision Gait Distance (Feet): 16 Feet Assistive device: None Gait Pattern/deviations: WFL(Within Functional Limits)        Stairs            Wheelchair Mobility    Modified Rankin (Stroke Patients Only)       Balance Overall balance assessment: No apparent balance deficits (not formally assessed)                                           Pertinent Vitals/Pain Pain Assessment: No/denies pain    Home Living Family/patient expects to be discharged to:: Private residence Living Arrangements: Spouse/significant other   Type of Home: House Home Access: Stairs to enter Entrance Stairs-Rails: Right Entrance Stairs-Number of Steps: 7 Home Layout: One level Home Equipment: None      Prior Function Level of Independence: Independent               Hand Dominance        Extremity/Trunk Assessment        Lower Extremity Assessment Lower Extremity Assessment: Overall WFL for tasks assessed    Cervical / Trunk Assessment Cervical / Trunk Assessment: Normal  Communication   Communication: No difficulties  Cognition Arousal/Alertness: Awake/alert Behavior During Therapy: WFL for tasks assessed/performed Overall Cognitive Status: Within Functional Limits for tasks assessed  General Comments      Exercises     Assessment/Plan    PT Assessment Patent does not need any further PT services  PT Problem List         PT Treatment Interventions      PT Goals (Current goals can be found in the Care Plan section)  Acute Rehab PT Goals PT Goal Formulation: All assessment and education complete, DC therapy    Frequency     Barriers to discharge        Co-evaluation               AM-PAC PT "6 Clicks" Mobility  Outcome Measure Help needed turning from your back to your side while in a flat bed without using bedrails?: None Help needed moving from lying  on your back to sitting on the side of a flat bed without using bedrails?: None Help needed moving to and from a bed to a chair (including a wheelchair)?: None Help needed standing up from a chair using your arms (e.g., wheelchair or bedside chair)?: None Help needed to walk in hospital room?: None Help needed climbing 3-5 steps with a railing? : None 6 Click Score: 24    End of Session Equipment Utilized During Treatment: Gait belt Activity Tolerance: Patient tolerated treatment well Patient left: in bed;with call bell/phone within reach;with bed alarm set   PT Visit Diagnosis: Difficulty in walking, not elsewhere classified (R26.2)    Time: 2956-2130 PT Time Calculation (min) (ACUTE ONLY): 20 min   Charges:   PT Evaluation $PT Eval Low Complexity: 1 Low        Kati PT, DPT Acute Rehabilitation Services Pager: [phone] Office: [phone]  Maida Sale E 07/08/2019, 12:10 PM

## 2019-07-08 NOTE — Progress Notes (Signed)
Carotid artery duplex completed. Refer to "CV Proc" under chart review to view preliminary results.  07/08/2019 12:10 PM Kelby Aline., MHA, RVT, RDCS, RDMS

## 2019-07-08 NOTE — Progress Notes (Signed)
2D echocardiogram completed.  07/08/2019 12:11 PM Kelby Aline., MHA, RVT, RDCS, RDMS

## 2019-07-08 NOTE — Plan of Care (Signed)
  Problem: Clinical Measurements: Goal: Ability to maintain clinical measurements within normal limits will improve Outcome: Adequate for Discharge Goal: Will remain free from infection Outcome: Adequate for Discharge Goal: Diagnostic test results will improve Outcome: Adequate for Discharge Goal: Respiratory complications will improve Outcome: Adequate for Discharge

## 2019-07-08 NOTE — Discharge Summary (Signed)
Physician Discharge Summary  Kelsey Conway SHF:[id number] DOB: 03-13-1945 DOA: 07/07/2019  PCP: Julian Hy, PA-C  Admit date: 07/07/2019 Discharge date: 07/08/2019  Admitted From: Home Disposition: Home   Recommendations for Outpatient Follow-up:  1. Follow up with PCP as scheduled 6/21. Note BP medications were decreased by half on discharge to avoid orthostatic hypotension. 2. Recommend cardiology follow up, consideration of outpatient cardiac monitoring given presence of palpitations and lightheadedness.   Home Health: None Equipment/Devices: None Discharge Condition: Stable CODE STATUS: Full Diet recommendation: Heart healthy, carb-modified  Brief/Interim Summary: Kelsey Conway is a 74 y.o. female with a history of HTN, T2DM, and GERD who presented to the ED with 1 week of intermittent lightheadedness, mostly while standing, growing more persistent and coinciding with increased dose of antihypertensives. CT head negative for acute process, no infections noted on exam, UA, CXR. She had no focal deficits and not necessarily vertiginous symptoms. Orthostatic vital signs were positive. Teleneurology recommended CVA rule out which was done with no significant findings (see below). PT has evaluated, feels vestibular function is normal. We will decrease BP medications and have her follow up with PCP.  Discharge Diagnoses:  Principal Problem:   Ataxia Active Problems:   Hypertension, essential, benign   Insomnia   Type 2 diabetes mellitus with other specified complication (HCC)   Generalized osteoarthritis of multiple sites   Mixed hyperlipidemia   Gastric mass   Low back pain   Class 2 obesity with body mass index (BMI) of 36.0 to 36.9 in adult  Orthostatic hypotension, dizziness: Not truly vertiginous, no stroke on MRI, showed mild to moderate for age nonspecific cerebral white matter signal changes. 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 Note starting antiplatelet.   HTN:  - Decrease antihypertensives and follow up with PCP.  Obesity: BMI is 36.  Other medications were unchanged.  Discharge Instructions Discharge Instructions    Diet - low sodium heart healthy   Complete by: As directed    Discharge instructions   Complete by: As directed    You were evaluated in the hospital for dizziness/lightheadedness. No stroke was found to explain these symptoms and your symptoms are not consistent with vertigo. You are stable for discharge with the following recommendations:  - Go back to taking only 20mg  of lisinopril and 12.5mg  of HCTZ until you follow up with your PCP next week. - Take your time getting up, get up with assistance and sit down if you feel faint.  - You will need to follow up with cardiology to consider outpatient heart monitoring. Your echocardiogram showed no evidence of heart failure or valvular abnormalities.  - If your symptoms worsen, seek medical attention right away.   Increase activity slowly   Complete by: As directed      Allergies as of 07/08/2019      Reactions   Latex Rash   Powder latex      Medication List    TAKE these medications   Accu-Chek Aviva Plus test strip Generic drug: glucose blood USE AS DIRECTED   Accu-Chek Softclix Lancets lancets USE TO TEST BLOOD SUGAR AS DIRECTED TWICE DAILY   atorvastatin 20 MG tablet Commonly known as: LIPITOR TAKE 1 TABLET BY MOUTH  DAILY   hydrochlorothiazide 25 MG tablet Commonly known as: HYDRODIURIL Take 0.5 tablets (12.5 mg total) by mouth daily. What changed: how much to take  Physician Discharge Summary  Kelsey Conway SHF:[id number] DOB: 03-13-1945 DOA: 07/07/2019  PCP: Julian Hy, PA-C  Admit date: 07/07/2019 Discharge date: 07/08/2019  Admitted From: Home Disposition: Home   Recommendations for Outpatient Follow-up:  1. Follow up with PCP as scheduled 6/21. Note BP medications were decreased by half on discharge to avoid orthostatic hypotension. 2. Recommend cardiology follow up, consideration of outpatient cardiac monitoring given presence of palpitations and lightheadedness.   Home Health: None Equipment/Devices: None Discharge Condition: Stable CODE STATUS: Full Diet recommendation: Heart healthy, carb-modified  Brief/Interim Summary: Kelsey Conway is a 74 y.o. female with a history of HTN, T2DM, and GERD who presented to the ED with 1 week of intermittent lightheadedness, mostly while standing, growing more persistent and coinciding with increased dose of antihypertensives. CT head negative for acute process, no infections noted on exam, UA, CXR. She had no focal deficits and not necessarily vertiginous symptoms. Orthostatic vital signs were positive. Teleneurology recommended CVA rule out which was done with no significant findings (see below). PT has evaluated, feels vestibular function is normal. We will decrease BP medications and have her follow up with PCP.  Discharge Diagnoses:  Principal Problem:   Ataxia Active Problems:   Hypertension, essential, benign   Insomnia   Type 2 diabetes mellitus with other specified complication (HCC)   Generalized osteoarthritis of multiple sites   Mixed hyperlipidemia   Gastric mass   Low back pain   Class 2 obesity with body mass index (BMI) of 36.0 to 36.9 in adult  Orthostatic hypotension, dizziness: Not truly vertiginous, no stroke on MRI, showed mild to moderate for age nonspecific cerebral white matter signal changes. 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 Note starting antiplatelet.   HTN:  - Decrease antihypertensives and follow up with PCP.  Obesity: BMI is 36.  Other medications were unchanged.  Discharge Instructions Discharge Instructions    Diet - low sodium heart healthy   Complete by: As directed    Discharge instructions   Complete by: As directed    You were evaluated in the hospital for dizziness/lightheadedness. No stroke was found to explain these symptoms and your symptoms are not consistent with vertigo. You are stable for discharge with the following recommendations:  - Go back to taking only 20mg  of lisinopril and 12.5mg  of HCTZ until you follow up with your PCP next week. - Take your time getting up, get up with assistance and sit down if you feel faint.  - You will need to follow up with cardiology to consider outpatient heart monitoring. Your echocardiogram showed no evidence of heart failure or valvular abnormalities.  - If your symptoms worsen, seek medical attention right away.   Increase activity slowly   Complete by: As directed      Allergies as of 07/08/2019      Reactions   Latex Rash   Powder latex      Medication List    TAKE these medications   Accu-Chek Aviva Plus test strip Generic drug: glucose blood USE AS DIRECTED   Accu-Chek Softclix Lancets lancets USE TO TEST BLOOD SUGAR AS DIRECTED TWICE DAILY   atorvastatin 20 MG tablet Commonly known as: LIPITOR TAKE 1 TABLET BY MOUTH  DAILY   hydrochlorothiazide 25 MG tablet Commonly known as: HYDRODIURIL Take 0.5 tablets (12.5 mg total) by mouth daily. What changed: how much to take  Physician Discharge Summary  Kelsey Conway SHF:[id number] DOB: 03-13-1945 DOA: 07/07/2019  PCP: Julian Hy, PA-C  Admit date: 07/07/2019 Discharge date: 07/08/2019  Admitted From: Home Disposition: Home   Recommendations for Outpatient Follow-up:  1. Follow up with PCP as scheduled 6/21. Note BP medications were decreased by half on discharge to avoid orthostatic hypotension. 2. Recommend cardiology follow up, consideration of outpatient cardiac monitoring given presence of palpitations and lightheadedness.   Home Health: None Equipment/Devices: None Discharge Condition: Stable CODE STATUS: Full Diet recommendation: Heart healthy, carb-modified  Brief/Interim Summary: Kelsey Conway is a 74 y.o. female with a history of HTN, T2DM, and GERD who presented to the ED with 1 week of intermittent lightheadedness, mostly while standing, growing more persistent and coinciding with increased dose of antihypertensives. CT head negative for acute process, no infections noted on exam, UA, CXR. She had no focal deficits and not necessarily vertiginous symptoms. Orthostatic vital signs were positive. Teleneurology recommended CVA rule out which was done with no significant findings (see below). PT has evaluated, feels vestibular function is normal. We will decrease BP medications and have her follow up with PCP.  Discharge Diagnoses:  Principal Problem:   Ataxia Active Problems:   Hypertension, essential, benign   Insomnia   Type 2 diabetes mellitus with other specified complication (HCC)   Generalized osteoarthritis of multiple sites   Mixed hyperlipidemia   Gastric mass   Low back pain   Class 2 obesity with body mass index (BMI) of 36.0 to 36.9 in adult  Orthostatic hypotension, dizziness: Not truly vertiginous, no stroke on MRI, showed mild to moderate for age nonspecific cerebral white matter signal changes. 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 Note starting antiplatelet.   HTN:  - Decrease antihypertensives and follow up with PCP.  Obesity: BMI is 36.  Other medications were unchanged.  Discharge Instructions Discharge Instructions    Diet - low sodium heart healthy   Complete by: As directed    Discharge instructions   Complete by: As directed    You were evaluated in the hospital for dizziness/lightheadedness. No stroke was found to explain these symptoms and your symptoms are not consistent with vertigo. You are stable for discharge with the following recommendations:  - Go back to taking only 20mg  of lisinopril and 12.5mg  of HCTZ until you follow up with your PCP next week. - Take your time getting up, get up with assistance and sit down if you feel faint.  - You will need to follow up with cardiology to consider outpatient heart monitoring. Your echocardiogram showed no evidence of heart failure or valvular abnormalities.  - If your symptoms worsen, seek medical attention right away.   Increase activity slowly   Complete by: As directed      Allergies as of 07/08/2019      Reactions   Latex Rash   Powder latex      Medication List    TAKE these medications   Accu-Chek Aviva Plus test strip Generic drug: glucose blood USE AS DIRECTED   Accu-Chek Softclix Lancets lancets USE TO TEST BLOOD SUGAR AS DIRECTED TWICE DAILY   atorvastatin 20 MG tablet Commonly known as: LIPITOR TAKE 1 TABLET BY MOUTH  DAILY   hydrochlorothiazide 25 MG tablet Commonly known as: HYDRODIURIL Take 0.5 tablets (12.5 mg total) by mouth daily. What changed: how much to take  mass effect, evidence of mass lesion, ventriculomegaly, extra-axial collection or acute intracranial hemorrhage. Cervicomedullary junction and pituitary are within normal limits. No cortical encephalomalacia identified. There is evidence of a solitary chronic microhemorrhage in the left posterior temporal lobe white matter on series 10, image 23. No other chronic blood products identified. Widely scattered although fairly mild or at  most moderate for age small subcortical white matter T2 and FLAIR hyperintense foci in both hemispheres. The deep gray matter nuclei, brainstem, and cerebellum appear normal. Vascular: Major intracranial vascular flow voids are preserved. The right vertebral artery appears dominant. Skull and upper cervical spine: Normal for age visible cervical spine. Visualized bone marrow signal is within normal limits. Sinuses/Orbits: Negative orbits. Paranasal sinus mucosal thickening most pronounced in the right maxillary sinus. Other: Mastoids are clear. Visible internal auditory structures appear normal. Normal stylomastoid foramina. Scalp and face soft tissues appear negative. IMPRESSION: 1. No acute intracranial abnormality. 2. Mild to moderate for age nonspecific cerebral white matter signal changes. These are most commonly due to chronic small vessel disease, and there is a solitary chronic microhemorrhage in the left temporal lobe white matter. Electronically Signed   By: Genevie Arshiya M.D.   On: 07/07/2019 18:41   ECHOCARDIOGRAM COMPLETE  Result Date: 07/08/2019    ECHOCARDIOGRAM REPORT   Patient Name:   Kelsey Conway Date of Exam: 07/08/2019 Medical Rec #:  [id number]    Height:       64.0 in Accession #:    [id number]   Weight:       209.9 lb Date of Birth:  [birth year]    BSA:          1.997 m Patient Age:    5 years     BP:           110/61 mmHg Patient Gender: F            HR:           66 bpm. Exam Location:  Inpatient Procedure: 2D Echo, Cardiac Doppler and Color Doppler Indications:    TIA 435.9/ G45.9  History:        Patient has prior history of Echocardiogram examinations, most                 recent 05/25/2016. Risk Factors:Hypertension and Diabetes.  Sonographer:    Maudry Mayhew MHA, RDMS, RVT, RDCS Referring Phys: [place]  1. Left ventricular ejection fraction, by estimation, is 60 to 65%. The left ventricle has normal function. The left ventricle has no regional wall motion  abnormalities. Left ventricular diastolic parameters were normal.  2. Right ventricular systolic function is normal. The right ventricular size is mildly enlarged. There is normal pulmonary artery systolic pressure. The estimated right ventricular systolic pressure is 65.7 mmHg.  3. The mitral valve is normal in structure. Trivial mitral valve regurgitation.  4. The aortic valve is tricuspid. Aortic valve regurgitation is not visualized. No aortic stenosis is present.  5. The inferior vena cava is dilated in size with >50% respiratory variability, suggesting right atrial pressure of 8 mmHg. FINDINGS  Left Ventricle: Left ventricular ejection fraction, by estimation, is 60 to 65%. The left ventricle has normal function. The left ventricle has no regional wall motion abnormalities. The left ventricular internal cavity size was normal in size. There is  no left ventricular hypertrophy. Left ventricular diastolic parameters were normal. Right Ventricle: The right ventricular size is mildly enlarged. Right vetricular wall thickness was not  mass effect, evidence of mass lesion, ventriculomegaly, extra-axial collection or acute intracranial hemorrhage. Cervicomedullary junction and pituitary are within normal limits. No cortical encephalomalacia identified. There is evidence of a solitary chronic microhemorrhage in the left posterior temporal lobe white matter on series 10, image 23. No other chronic blood products identified. Widely scattered although fairly mild or at  most moderate for age small subcortical white matter T2 and FLAIR hyperintense foci in both hemispheres. The deep gray matter nuclei, brainstem, and cerebellum appear normal. Vascular: Major intracranial vascular flow voids are preserved. The right vertebral artery appears dominant. Skull and upper cervical spine: Normal for age visible cervical spine. Visualized bone marrow signal is within normal limits. Sinuses/Orbits: Negative orbits. Paranasal sinus mucosal thickening most pronounced in the right maxillary sinus. Other: Mastoids are clear. Visible internal auditory structures appear normal. Normal stylomastoid foramina. Scalp and face soft tissues appear negative. IMPRESSION: 1. No acute intracranial abnormality. 2. Mild to moderate for age nonspecific cerebral white matter signal changes. These are most commonly due to chronic small vessel disease, and there is a solitary chronic microhemorrhage in the left temporal lobe white matter. Electronically Signed   By: Genevie Arshiya M.D.   On: 07/07/2019 18:41   ECHOCARDIOGRAM COMPLETE  Result Date: 07/08/2019    ECHOCARDIOGRAM REPORT   Patient Name:   Kelsey Conway Date of Exam: 07/08/2019 Medical Rec #:  [id number]    Height:       64.0 in Accession #:    [id number]   Weight:       209.9 lb Date of Birth:  [birth year]    BSA:          1.997 m Patient Age:    5 years     BP:           110/61 mmHg Patient Gender: F            HR:           66 bpm. Exam Location:  Inpatient Procedure: 2D Echo, Cardiac Doppler and Color Doppler Indications:    TIA 435.9/ G45.9  History:        Patient has prior history of Echocardiogram examinations, most                 recent 05/25/2016. Risk Factors:Hypertension and Diabetes.  Sonographer:    Maudry Mayhew MHA, RDMS, RVT, RDCS Referring Phys: [place]  1. Left ventricular ejection fraction, by estimation, is 60 to 65%. The left ventricle has normal function. The left ventricle has no regional wall motion  abnormalities. Left ventricular diastolic parameters were normal.  2. Right ventricular systolic function is normal. The right ventricular size is mildly enlarged. There is normal pulmonary artery systolic pressure. The estimated right ventricular systolic pressure is 65.7 mmHg.  3. The mitral valve is normal in structure. Trivial mitral valve regurgitation.  4. The aortic valve is tricuspid. Aortic valve regurgitation is not visualized. No aortic stenosis is present.  5. The inferior vena cava is dilated in size with >50% respiratory variability, suggesting right atrial pressure of 8 mmHg. FINDINGS  Left Ventricle: Left ventricular ejection fraction, by estimation, is 60 to 65%. The left ventricle has normal function. The left ventricle has no regional wall motion abnormalities. The left ventricular internal cavity size was normal in size. There is  no left ventricular hypertrophy. Left ventricular diastolic parameters were normal. Right Ventricle: The right ventricular size is mildly enlarged. Right vetricular wall thickness was not  mass effect, evidence of mass lesion, ventriculomegaly, extra-axial collection or acute intracranial hemorrhage. Cervicomedullary junction and pituitary are within normal limits. No cortical encephalomalacia identified. There is evidence of a solitary chronic microhemorrhage in the left posterior temporal lobe white matter on series 10, image 23. No other chronic blood products identified. Widely scattered although fairly mild or at  most moderate for age small subcortical white matter T2 and FLAIR hyperintense foci in both hemispheres. The deep gray matter nuclei, brainstem, and cerebellum appear normal. Vascular: Major intracranial vascular flow voids are preserved. The right vertebral artery appears dominant. Skull and upper cervical spine: Normal for age visible cervical spine. Visualized bone marrow signal is within normal limits. Sinuses/Orbits: Negative orbits. Paranasal sinus mucosal thickening most pronounced in the right maxillary sinus. Other: Mastoids are clear. Visible internal auditory structures appear normal. Normal stylomastoid foramina. Scalp and face soft tissues appear negative. IMPRESSION: 1. No acute intracranial abnormality. 2. Mild to moderate for age nonspecific cerebral white matter signal changes. These are most commonly due to chronic small vessel disease, and there is a solitary chronic microhemorrhage in the left temporal lobe white matter. Electronically Signed   By: Genevie Arshiya M.D.   On: 07/07/2019 18:41   ECHOCARDIOGRAM COMPLETE  Result Date: 07/08/2019    ECHOCARDIOGRAM REPORT   Patient Name:   Kelsey Conway Date of Exam: 07/08/2019 Medical Rec #:  [id number]    Height:       64.0 in Accession #:    [id number]   Weight:       209.9 lb Date of Birth:  [birth year]    BSA:          1.997 m Patient Age:    5 years     BP:           110/61 mmHg Patient Gender: F            HR:           66 bpm. Exam Location:  Inpatient Procedure: 2D Echo, Cardiac Doppler and Color Doppler Indications:    TIA 435.9/ G45.9  History:        Patient has prior history of Echocardiogram examinations, most                 recent 05/25/2016. Risk Factors:Hypertension and Diabetes.  Sonographer:    Maudry Mayhew MHA, RDMS, RVT, RDCS Referring Phys: [place]  1. Left ventricular ejection fraction, by estimation, is 60 to 65%. The left ventricle has normal function. The left ventricle has no regional wall motion  abnormalities. Left ventricular diastolic parameters were normal.  2. Right ventricular systolic function is normal. The right ventricular size is mildly enlarged. There is normal pulmonary artery systolic pressure. The estimated right ventricular systolic pressure is 65.7 mmHg.  3. The mitral valve is normal in structure. Trivial mitral valve regurgitation.  4. The aortic valve is tricuspid. Aortic valve regurgitation is not visualized. No aortic stenosis is present.  5. The inferior vena cava is dilated in size with >50% respiratory variability, suggesting right atrial pressure of 8 mmHg. FINDINGS  Left Ventricle: Left ventricular ejection fraction, by estimation, is 60 to 65%. The left ventricle has normal function. The left ventricle has no regional wall motion abnormalities. The left ventricular internal cavity size was normal in size. There is  no left ventricular hypertrophy. Left ventricular diastolic parameters were normal. Right Ventricle: The right ventricular size is mildly enlarged. Right vetricular wall thickness was not  mass effect, evidence of mass lesion, ventriculomegaly, extra-axial collection or acute intracranial hemorrhage. Cervicomedullary junction and pituitary are within normal limits. No cortical encephalomalacia identified. There is evidence of a solitary chronic microhemorrhage in the left posterior temporal lobe white matter on series 10, image 23. No other chronic blood products identified. Widely scattered although fairly mild or at  most moderate for age small subcortical white matter T2 and FLAIR hyperintense foci in both hemispheres. The deep gray matter nuclei, brainstem, and cerebellum appear normal. Vascular: Major intracranial vascular flow voids are preserved. The right vertebral artery appears dominant. Skull and upper cervical spine: Normal for age visible cervical spine. Visualized bone marrow signal is within normal limits. Sinuses/Orbits: Negative orbits. Paranasal sinus mucosal thickening most pronounced in the right maxillary sinus. Other: Mastoids are clear. Visible internal auditory structures appear normal. Normal stylomastoid foramina. Scalp and face soft tissues appear negative. IMPRESSION: 1. No acute intracranial abnormality. 2. Mild to moderate for age nonspecific cerebral white matter signal changes. These are most commonly due to chronic small vessel disease, and there is a solitary chronic microhemorrhage in the left temporal lobe white matter. Electronically Signed   By: Genevie Arshiya M.D.   On: 07/07/2019 18:41   ECHOCARDIOGRAM COMPLETE  Result Date: 07/08/2019    ECHOCARDIOGRAM REPORT   Patient Name:   Kelsey Conway Date of Exam: 07/08/2019 Medical Rec #:  [id number]    Height:       64.0 in Accession #:    [id number]   Weight:       209.9 lb Date of Birth:  [birth year]    BSA:          1.997 m Patient Age:    5 years     BP:           110/61 mmHg Patient Gender: F            HR:           66 bpm. Exam Location:  Inpatient Procedure: 2D Echo, Cardiac Doppler and Color Doppler Indications:    TIA 435.9/ G45.9  History:        Patient has prior history of Echocardiogram examinations, most                 recent 05/25/2016. Risk Factors:Hypertension and Diabetes.  Sonographer:    Maudry Mayhew MHA, RDMS, RVT, RDCS Referring Phys: [place]  1. Left ventricular ejection fraction, by estimation, is 60 to 65%. The left ventricle has normal function. The left ventricle has no regional wall motion  abnormalities. Left ventricular diastolic parameters were normal.  2. Right ventricular systolic function is normal. The right ventricular size is mildly enlarged. There is normal pulmonary artery systolic pressure. The estimated right ventricular systolic pressure is 65.7 mmHg.  3. The mitral valve is normal in structure. Trivial mitral valve regurgitation.  4. The aortic valve is tricuspid. Aortic valve regurgitation is not visualized. No aortic stenosis is present.  5. The inferior vena cava is dilated in size with >50% respiratory variability, suggesting right atrial pressure of 8 mmHg. FINDINGS  Left Ventricle: Left ventricular ejection fraction, by estimation, is 60 to 65%. The left ventricle has normal function. The left ventricle has no regional wall motion abnormalities. The left ventricular internal cavity size was normal in size. There is  no left ventricular hypertrophy. Left ventricular diastolic parameters were normal. Right Ventricle: The right ventricular size is mildly enlarged. Right vetricular wall thickness was not

## 2019-07-25 ENCOUNTER — Other Ambulatory Visit: Payer: Self-pay | Admitting: Family Medicine

## 2019-08-10 ENCOUNTER — Other Ambulatory Visit: Payer: Self-pay | Admitting: Family Medicine

## 2019-09-09 ENCOUNTER — Encounter: Payer: Medicare Other | Admitting: Gastroenterology

## 2020-03-11 ENCOUNTER — Other Ambulatory Visit: Payer: Self-pay | Admitting: Family Medicine

## 2020-03-11 DIAGNOSIS — Z1231 Encounter for screening mammogram for malignant neoplasm of breast: Secondary | ICD-10-CM

## 2020-03-14 ENCOUNTER — Ambulatory Visit
Admission: RE | Admit: 2020-03-14 | Discharge: 2020-03-14 | Disposition: A | Discharge status: Home / Self Care | Payer: Medicare Other | Source: Ambulatory Visit | Attending: Family Medicine | Admitting: Family Medicine

## 2020-03-14 ENCOUNTER — Other Ambulatory Visit: Payer: Self-pay

## 2020-03-14 DIAGNOSIS — Z1231 Encounter for screening mammogram for malignant neoplasm of breast: Secondary | ICD-10-CM

## 2020-08-06 ENCOUNTER — Other Ambulatory Visit: Payer: Self-pay | Admitting: Family Medicine

## 2020-08-10 ENCOUNTER — Other Ambulatory Visit: Payer: Self-pay | Admitting: Family Medicine

## 2020-11-03 ENCOUNTER — Other Ambulatory Visit: Payer: Self-pay | Admitting: Family Medicine

## 2021-02-13 ENCOUNTER — Other Ambulatory Visit: Payer: Self-pay | Admitting: Family Medicine

## 2021-02-16 ENCOUNTER — Emergency Department (HOSPITAL_COMMUNITY)
Admission: EM | Admit: 2021-02-16 | Discharge: 2021-02-16 | Disposition: A | Discharge status: Home / Self Care | Payer: Medicare Other | Attending: Emergency Medicine | Admitting: Emergency Medicine

## 2021-02-16 ENCOUNTER — Other Ambulatory Visit: Payer: Self-pay

## 2021-02-16 ENCOUNTER — Emergency Department (HOSPITAL_COMMUNITY): Payer: Medicare Other

## 2021-02-16 ENCOUNTER — Encounter (HOSPITAL_COMMUNITY): Payer: Self-pay

## 2021-02-16 DIAGNOSIS — R42 Dizziness and giddiness: Secondary | ICD-10-CM | POA: Diagnosis not present

## 2021-02-16 DIAGNOSIS — E119 Type 2 diabetes mellitus without complications: Secondary | ICD-10-CM | POA: Diagnosis not present

## 2021-02-16 DIAGNOSIS — I10 Essential (primary) hypertension: Secondary | ICD-10-CM | POA: Diagnosis not present

## 2021-02-16 DIAGNOSIS — M5432 Sciatica, left side: Secondary | ICD-10-CM | POA: Insufficient documentation

## 2021-02-16 DIAGNOSIS — R079 Chest pain, unspecified: Secondary | ICD-10-CM

## 2021-02-16 DIAGNOSIS — Z79899 Other long term (current) drug therapy: Secondary | ICD-10-CM | POA: Insufficient documentation

## 2021-02-16 DIAGNOSIS — Z91040 Latex allergy status: Secondary | ICD-10-CM | POA: Insufficient documentation

## 2021-02-16 DIAGNOSIS — R0602 Shortness of breath: Secondary | ICD-10-CM | POA: Diagnosis not present

## 2021-02-16 DIAGNOSIS — M549 Dorsalgia, unspecified: Secondary | ICD-10-CM | POA: Insufficient documentation

## 2021-02-16 DIAGNOSIS — N644 Mastodynia: Secondary | ICD-10-CM | POA: Insufficient documentation

## 2021-02-16 LAB — BASIC METABOLIC PANEL
Anion gap: 6 (ref 5–15)
BUN: 10 mg/dL (ref 8–23)
CO2: 32 mmol/L (ref 22–32)
Calcium: 9.3 mg/dL (ref 8.9–10.3)
Chloride: 102 mmol/L (ref 98–111)
Creatinine, Ser: 0.81 mg/dL (ref 0.44–1.00)
GFR, Estimated: >60 mL/min (ref >60)
Glucose, Bld: 98 mg/dL (ref 70–99)
Potassium: 3.1 mmol/L — ABNORMAL LOW (ref 3.5–5.1)
Sodium: 140 mmol/L (ref 135–145)

## 2021-02-16 LAB — CBC
HCT: 38.9 % (ref 36.0–46.0)
Hemoglobin: 12 g/dL (ref 12.0–15.0)
MCH: 26.5 pg (ref 26.0–34.0)
MCHC: 30.8 g/dL (ref 30.0–36.0)
MCV: 85.9 fL (ref 80.0–100.0)
Platelets: 333 10*3/uL (ref 150–400)
RBC: 4.53 MIL/uL (ref 3.87–5.11)
RDW: 14.8 % (ref 11.5–15.5)
WBC: 6.7 10*3/uL (ref 4.0–10.5)
nRBC: 0 % (ref 0.0–0.2)

## 2021-02-16 LAB — TROPONIN I (HIGH SENSITIVITY)
Troponin I (High Sensitivity): 6 ng/L (ref <18)
Troponin I (High Sensitivity): 8 ng/L (ref <18)

## 2021-02-16 MED ORDER — PREDNISONE 20 MG PO TABS: 40.0000 mg | ORAL_TABLET | Freq: Every day | ORAL | 0 refills | Status: DC

## 2021-02-16 MED ORDER — KETOROLAC TROMETHAMINE 30 MG/ML IJ SOLN
15.0000 mg | Freq: Once | INTRAMUSCULAR | Status: AC
  Administered 2021-02-16: 22:00:00 15 mg via INTRAVENOUS
  Filled 2021-02-16: qty 1

## 2021-02-16 MED ORDER — POTASSIUM CHLORIDE CRYS ER 20 MEQ PO TBCR
20.0000 meq | EXTENDED_RELEASE_TABLET | Freq: Once | ORAL | Status: AC
  Administered 2021-02-16: 22:00:00 20 meq via ORAL
  Filled 2021-02-16: qty 1

## 2021-02-16 NOTE — ED Provider Triage Note (Signed)
Emergency Medicine Provider Triage Evaluation Note  Kelsey Conway , a 76 y.o. female  was evaluated in triage.  Pt complains of chest pain that started approximately 2 hours prior to arrival.  Patient states she was walking around her kitchen about to fix him to the ER when she developed this chest pain described as pressure.  Pain was relieved when she sat down to rest a bit, however when she got up again, pain resumed along with shortness of breath.  Pain is since resolved on its own without any medical intervention.  Patient also endorses left hip pain that radiates down the posterior thigh to her feet for 1 week that is also constant described as shooting.  No alleviating or aggravating factors.  No treatment prior to arrival.  No history of cardiac disease.  Review of Systems  Positive: Chest pain, shortness of breath, left hip pain Negative: Abdominal pain, N/V/D  Physical Exam  BP (!) 158/79 (BP Location: Left Arm)    Pulse (!) 59    Temp 98.4 F (36.9 C) (Oral)    Resp 16    Ht 5\' 4"  (1.626 m)    Wt 83.5 kg    SpO2 95%    BMI 31.58 kg/m  Gen:   Awake, no distress   Resp:  Normal effort CTA bilaterally MSK:   Moves extremities without difficulty  Other:  Heart rate regular rhythm, slightly bradycardic  Medical Decision Making  Medically screening exam initiated at 2:51 PM.  Appropriate orders placed.  [name] was informed that the remainder of the evaluation will be completed by another provider, this initial triage assessment does not replace that evaluation, and the importance of remaining in the ED until their evaluation is complete.     [name], [place] 02/16/21 1454

## 2021-02-16 NOTE — ED Triage Notes (Signed)
Patient reports that she was watching TV and felt dizzy and SOB, then began having  constant left chest pain. .  Patient also c/o left hip pain that radiates down the left leg x 1 week. Patient states worse today.

## 2021-02-16 NOTE — Discharge Instructions (Addendum)
You were seen in the emergency department for 1 week of left hip and leg pain and 1 day of left chest pain.  You had blood work EKG and a chest x-ray that did not show any evidence of heart attack.  Your leg pain seems most consistent with sciatica.  We will try you on some prednisone to see if that helps.  You can continue the pain medication but be careful that you do not get constipated.  Please closely follow-up with your primary care doctor.

## 2021-02-16 NOTE — ED Provider Notes (Signed)
[facility] DEPT Provider Note   CSN: [id number] Arrival date & time: 02/16/21  1339     History  Chief Complaint  Patient presents with   Chest Pain   Shortness of Breath   Hip Pain    Kelsey Conway is a 76 y.o. female.  She has a history of hypertension and diabetes.  She is complaining of 2 different problems.  #1 it is pain in her left buttock that goes down to the bottom of her foot on the left leg its been going on for a week.  She saw her PCP for this who is ordering her some vascular studies and put her on some oxycodone.  Not helping.  Problem 2 is today while she was making some lunch she experienced left chest and breast pain and associated shortness of breath.  It improved with rest but is still there and feels like a throbbing sensation.  She denies any prior history of same.  No cough.  No injury.  No known coronary disease  The history is provided by the patient.  Chest Pain Pain location:  L chest Pain quality: throbbing   Pain radiates to:  Does not radiate Pain severity:  Severe Onset quality:  Sudden Duration:  9 hours Timing:  Constant Progression:  Partially resolved Chronicity:  New Relieved by:  None tried Worsened by:  Nothing Ineffective treatments:  Rest Associated symptoms: back pain and shortness of breath   Associated symptoms: no cough, no diaphoresis, no fever, no headache, no nausea, no numbness, no vomiting and no weakness   Risk factors: diabetes mellitus, high cholesterol and hypertension   Risk factors: no smoking   Shortness of Breath Associated symptoms: chest pain   Associated symptoms: no cough, no diaphoresis, no fever, no headaches, no rash, no sore throat and no vomiting   Hip Pain Associated symptoms include chest pain and shortness of breath. Pertinent negatives include no headaches.      Home Medications Prior to Admission medications   Medication Sig Start Date End Date Taking? Authorizing  Provider  ACCU-CHEK AVIVA PLUS test strip USE AS DIRECTED 04/24/18   [name], [name], MD  Accu-Chek Softclix Lancets lancets USE TO TEST BLOOD SUGAR AS DIRECTED TWICE DAILY 04/24/18   [name], [name], MD  atorvastatin (LIPITOR) 20 MG tablet TAKE 1 TABLET BY MOUTH  DAILY Patient taking differently: Take 20 mg by mouth daily.  12/05/18   Nafziger, Tommi Rumps, NP  hydrochlorothiazide (HYDRODIURIL) 25 MG tablet Take 0.5 tablets (12.5 mg total) by mouth daily. 07/08/19   Patrecia Pour, MD  latanoprost (XALATAN) 0.005 % ophthalmic solution Place 1 drop into both eyes at bedtime.  09/28/17   [provider]  lisinopril (ZESTRIL) 40 MG tablet Take 0.5 tablets (20 mg total) by mouth daily. 07/08/19   Patrecia Pour, MD  meclizine (ANTIVERT) 25 MG tablet Take 25 mg by mouth 3 (three) times daily as needed for nausea/vomiting. 07/05/19   [provider]  ondansetron (ZOFRAN) 8 MG tablet Take 8 mg by mouth 3 (three) times daily as needed for nausea/vomiting. 07/05/19   [provider]  pantoprazole (PROTONIX) 20 MG tablet Take 1 tablet (20 mg total) by mouth daily. 06/09/19   Rayna Sexton, PA-C  traZODone (DESYREL) 50 MG tablet Take 50 mg by mouth every evening. 07/05/19   [provider]  Vitamin D, Ergocalciferol, (DRISDOL) 1.25 MG (50000 UNIT) CAPS capsule Take 50,000 Units by mouth once a week. 06/17/19  [provider]      Allergies    Latex    Review of Systems   Review of Systems  Constitutional:  Negative for diaphoresis and fever.  HENT:  Negative for sore throat.   Eyes:  Negative for visual disturbance.  Respiratory:  Positive for shortness of breath. Negative for cough.   Cardiovascular:  Positive for chest pain.  Gastrointestinal:  Negative for nausea and vomiting.  Genitourinary:  Negative for dysuria.  Musculoskeletal:  Positive for back pain.  Skin:  Negative for rash.  Neurological:  Negative for weakness, numbness and headaches.   Physical  Exam Updated Vital Signs BP (!) 153/76    Pulse 73    Temp 98.4 F (36.9 C) (Oral)    Resp (!) 35    Ht 5\' 4"  (1.626 m)    Wt 83.5 kg    SpO2 96%    BMI 31.58 kg/m  Physical Exam Vitals and nursing note reviewed.  Constitutional:      General: She is not in acute distress.    Appearance: She is well-developed.  HENT:     Head: Normocephalic and atraumatic.  Eyes:     Conjunctiva/sclera: Conjunctivae normal.  Cardiovascular:     Rate and Rhythm: Normal rate and regular rhythm.     Pulses: Normal pulses.     Heart sounds: No murmur heard. Pulmonary:     Effort: Pulmonary effort is normal. No respiratory distress.     Breath sounds: Normal breath sounds.  Abdominal:     Palpations: Abdomen is soft.     Tenderness: There is no abdominal tenderness.  Musculoskeletal:        General: Tenderness present. No swelling. Normal range of motion.     Cervical back: Neck supple.     Right lower leg: No tenderness. No edema.     Left lower leg: No tenderness. No edema.     Comments: She is some tenderness from her left sciatic notch.  She has 2+ DP and PT pulses of both lower extremities.  There is no cords or edema.  She has pain with straight leg raise on left.  Skin:    General: Skin is warm and dry.     Capillary Refill: Capillary refill takes less than 2 seconds.  Neurological:     General: No focal deficit present.     Mental Status: She is alert.     Sensory: No sensory deficit.     Motor: No weakness.    ED Results / Procedures / Treatments   Labs (all labs ordered are listed, but only abnormal results are displayed) Labs Reviewed  BASIC METABOLIC PANEL - Abnormal; Notable for the following components:      Result Value   Potassium 3.1 (*)    All other components within normal limits  CBC  TROPONIN I (HIGH SENSITIVITY)  TROPONIN I (HIGH SENSITIVITY)    EKG EKG Interpretation  Date/Time:  Tuesday February 16 2021 14:18:18 EST Ventricular Rate:  59 PR  Interval:  156 QRS Duration: 102 QT Interval:  513 QTC Calculation: 509 R Axis:   58 Text Interpretation: Sinus rhythm Abnormal R-wave progression, early transition Borderline T abnormalities, anterior leads No significant change since 6/21 computer overreading QT length Confirmed by Aletta Edouard [phone]) on 02/17/2021 9:27:15 AM  Radiology DG Chest 2 View  Result Date: 02/16/2021 CLINICAL DATA:  Chest pain and shortness of breath.  Dizziness. EXAM: CHEST - 2 VIEW COMPARISON:  07/07/2019 FINDINGS: Atherosclerotic calcification  of the aortic arch. Heart size within normal limits. The lungs appear clear. No significant bony abnormality observed. No blunting of the costophrenic angles. IMPRESSION: 1. No acute findings. 2.  Aortic Atherosclerosis (ICD10-I70.0). Electronically Signed   By: Van Clines M.D.   On: 02/16/2021 14:57    Procedures Procedures    Medications Ordered in ED Medications - No data to display  ED Course/ Medical Decision Making/ A&P Clinical Course as of 02/17/21 0925  Tue Feb 16, 2021  2228 Patient feels somewhat better after the Toradol.  We will trial her on some prednisone as the narcotics do not seem to be helping her leg pain.  No evidence of her chest pain being cardiac chest pain at this time.  Doubt PE satting 100% on room air. [MB]  2231 Patient had an MRI of her lumbar spine for similar symptoms about a year and a half ago.  That showed some significant stenosis and arthritis [MB]    Clinical Course User Index [MB] Hayden Rasmussen, MD                           Medical Decision Making Amount and/or Complexity of Data Reviewed Labs: ordered. Radiology: ordered.  Risk Prescription drug management.  This patient complains of chest pain shortness of breath, dizziness, left hip and leg pain; this involves an extensive number of treatment Options and is a complaint that carries with it a high risk of complications and Morbidity. The differential  includes, ACS, pneumonia, PE, pneumothorax, metabolic derangement, anemia, sciatica, claudication  I ordered, reviewed and interpreted labs, which included CBC with normal white count normal hemoglobin, chemistries with low potassium, troponins flat I ordered medication oral potassium and IV Toradol with improvement in her symptoms I ordered imaging studies which included chest x-ray and I independently    visualized and interpreted imaging which showed no acute findings Additional history obtained from patient significant other Previous records obtained and reviewed in epic, patient admitted in 2021 with disequilibrium no clear etiology identified  After the interventions stated above, I reevaluated the patient and found patient symptoms to be improved.  She does have a history of borderline diabetes but we will trial on some prednisone as her oral narcotics are not helping with her sciatic pain.  Recommended close follow-up with PCP.  No indications for admission at this time.  Return instructions discussed          Final Clinical Impression(s) / ED Diagnoses Final diagnoses:  Nonspecific chest pain  Sciatica of left side    Rx / DC Orders ED Discharge Orders          Ordered    predniSONE (DELTASONE) 20 MG tablet  Daily        02/16/21 2231              Hayden Rasmussen, MD 02/17/21 [phone]

## 2021-03-04 ENCOUNTER — Ambulatory Visit: Payer: Medicare Other | Admitting: Sports Medicine

## 2021-03-18 ENCOUNTER — Encounter: Payer: Self-pay | Admitting: Sports Medicine

## 2021-03-18 ENCOUNTER — Ambulatory Visit: Payer: Medicare Other | Admitting: Sports Medicine

## 2021-03-18 ENCOUNTER — Other Ambulatory Visit: Payer: Self-pay | Admitting: Family Medicine

## 2021-03-18 ENCOUNTER — Other Ambulatory Visit: Payer: Self-pay

## 2021-03-18 DIAGNOSIS — L819 Disorder of pigmentation, unspecified: Secondary | ICD-10-CM

## 2021-03-18 DIAGNOSIS — E1169 Type 2 diabetes mellitus with other specified complication: Secondary | ICD-10-CM | POA: Diagnosis not present

## 2021-03-18 DIAGNOSIS — Z1231 Encounter for screening mammogram for malignant neoplasm of breast: Secondary | ICD-10-CM

## 2021-03-18 DIAGNOSIS — L603 Nail dystrophy: Secondary | ICD-10-CM | POA: Diagnosis not present

## 2021-03-18 NOTE — Progress Notes (Signed)
Subjective: Kelsey Conway is a 76 y.o. female patient with history of diabetes who presents to office today for diabetic foot exam.  Patient reports that she has noticed some discoloration to her toenails and that the pigmented lesions on the bottom of the foot may be getting darker patient denies any pain and states that she goes for routine pedicures but thought it was a good idea since she is diabetic to have her feet checked since it been a really long time..  Patient reports that her blood sugar this morning was 112.  Patient is unsure of her last A1c.  Last visit to PCP Carylon Perches, NP was 03/12/2021.  Patient denies any changes any new health problems recently.   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     ACCU-CHEK AVIVA PLUS test strip USE AS DIRECTED 100 each 2   Accu-Chek Softclix Lancets lancets USE TO TEST BLOOD SUGAR AS DIRECTED TWICE DAILY 100 each 2   atorvastatin (LIPITOR) 20 MG tablet TAKE 1 TABLET BY MOUTH  DAILY (Patient taking differently: Take 20 mg by mouth daily. ) 90 tablet 3   diazepam (VALIUM) 5 MG tablet Take 5 mg by mouth as directed.     gabapentin (NEURONTIN) 300 MG capsule Take 300 mg by mouth 3 (three) times daily as needed.     hydrochlorothiazide (HYDRODIURIL) 25 MG tablet Take 0.5 tablets (12.5 mg  total) by mouth daily.     latanoprost (XALATAN) 0.005 % ophthalmic solution Place 1 drop into both eyes at bedtime.      lisinopril (ZESTRIL) 20 MG tablet Take 20 mg by mouth daily.     lisinopril (ZESTRIL) 40 MG tablet Take 0.5 tablets (20 mg total) by mouth daily. 90 tablet 1   LUMIGAN 0.01 % SOLN SMARTSIG:In Eye(s)     meclizine (ANTIVERT) 25 MG tablet Take 25 mg by mouth 3 (three) times daily as needed for nausea/vomiting.     MEDROL 4 MG TBPK tablet Take by mouth.     moxifloxacin (VIGAMOX) 0.5 % ophthalmic solution Place into the right eye.     naloxone (NARCAN) nasal spray 4 mg/0.1 mL SMARTSIG:Both Nares     ondansetron (ZOFRAN) 8 MG tablet Take 8 mg by mouth 3 (three) times daily as needed for nausea/vomiting.     oxyCODONE-acetaminophen (PERCOCET) 10-325 MG tablet Take 1 tablet by mouth 4 (four) times daily as needed.     pantoprazole (PROTONIX) 20 MG tablet Take 1 tablet (20 mg total) by mouth daily. 30 tablet 0   prednisoLONE acetate (PRED FORTE) 1 % ophthalmic suspension Place 1 drop into the right eye 4 (four) times daily.     predniSONE (DELTASONE) 20 MG tablet Take 2 tablets (40 mg total) by mouth daily. 10 tablet 0   PROLENSA 0.07 % SOLN  Place 1 drop into the right eye daily.     traZODone (DESYREL) 50 MG tablet Take 50 mg by mouth every evening.     Vitamin D, Ergocalciferol, (DRISDOL) 1.25 MG (50000 UNIT) CAPS capsule Take 50,000 Units by mouth once a week.     No current facility-administered medications on file prior to visit.   Allergies  Allergen Reactions   Latex Rash    Powder latex    Recent Results (from the past 2160 hour(s))  Basic metabolic panel     Status: Abnormal   Collection Time: 02/16/21  3:18 PM  Result Value Ref Range   Sodium 140 135 - 145 mmol/L   Potassium 3.1 (L) 3.5 - 5.1 mmol/L   Chloride 102 98 - 111 mmol/L   CO2 32 22 - 32 mmol/L   Glucose, Bld 98 70 - 99 mg/dL    Comment: Glucose reference range applies only to samples taken after  fasting for at least 8 hours.   BUN 10 8 - 23 mg/dL   Creatinine, Ser 0.81 0.44 - 1.00 mg/dL   Calcium 9.3 8.9 - 10.3 mg/dL   GFR, Estimated >60 >60 mL/min    Comment: (NOTE) Calculated using the CKD-EPI Creatinine Equation (2021)    Anion gap 6 5 - 15    Comment: Performed at [facility], [place] [address], [place], [place] [address]  CBC     Status: None   Collection Time: 02/16/21  3:18 PM  Result Value Ref Range   WBC 6.7 4.0 - 10.5 K/uL   RBC 4.53 3.87 - 5.11 MIL/uL   Hemoglobin 12.0 12.0 - 15.0 g/dL   HCT 38.9 36.0 - 46.0 %   MCV 85.9 80.0 - 100.0 fL   MCH 26.5 26.0 - 34.0 pg   MCHC 30.8 30.0 - 36.0 g/dL   RDW 14.8 11.5 - 15.5 %   Platelets 333 150 - 400 K/uL   nRBC 0.0 0.0 - 0.2 %    Comment: Performed at [facility], [place] [address], [place], [place] [address]  Troponin I (High Sensitivity)     Status: None   Collection Time: 02/16/21  3:18 PM  Result Value Ref Range   Troponin I (High Sensitivity) 6 <18 ng/L    Comment: (NOTE) Elevated high sensitivity troponin I (hsTnI) values and significant  changes across serial measurements may suggest ACS but many other  chronic and acute conditions are known to elevate hsTnI results.  Refer to the "Links" section for chest pain algorithms and additional  guidance. Performed at [facility], [place] [address] [place], [place] [address]   Troponin I (High Sensitivity)     Status: None   Collection Time: 02/16/21  8:00 PM  Result Value Ref Range   Troponin I (High Sensitivity) 8 <18 ng/L    Comment: (NOTE) Elevated high sensitivity troponin I (hsTnI) values and significant  changes across serial measurements may suggest ACS but many other  chronic and acute conditions are known to elevate hsTnI results.  Refer to the "Links" section for chest pain algorithms and additional  guidance. Performed at [facility], [place] [address], [place], [place]  [address]     Objective: General: Patient is awake, alert, and oriented x 3 and in no acute distress.  Integument: Skin is warm, dry and supple bilateral. Nails are short well manicured with minimal subungual debris bilateral hallux there is white streaks to multiple nails bilateral likely consistent  with excessive use of polish or nail lacquer no major concerns at this time for nail fungus.  There are pigmented lesions most lesions are well-circumscribed except to the right plantar central heel there is a pigmented lesion with ill-defined borders questionable if atypical in nature no surrounding erythema edema warmth pain or symptoms to this lesion.  Nonpulsatile.  Vasculature:  Dorsalis Pedis pulse 1/4 bilateral. Posterior Tibial pulse 1/4 bilateral. Capillary fill time <3 sec 1-5 bilateral. Positive hair growth to the level of the digits.Temperature gradient within normal limits. No varicosities present bilateral. No edema present bilateral.   Neurology: The patient has intact sensation measured with a 5.07/10g Semmes Weinstein Monofilament at all pedal sites bilateral . Vibratory sensation diminished bilateral with tuning fork. No Babinski sign present bilateral.   Musculoskeletal: Asymptomatic pes planus pedal deformities noted bilateral.  No pain to palpation bilateral..  Assessment and Plan: Problem List Items Addressed This Visit       Endocrine   Type 2 diabetes mellitus with other specified complication ([place])   Relevant Medications   lisinopril (ZESTRIL) 20 MG tablet   Other Visit Diagnoses     Pigmented skin lesion    -  Primary   Relevant Orders   Ambulatory referral to Dermatology   Nail dystrophy           -Examined patient. -Discussed and educated patient on diabetic foot care, especially with  regards to the vascular, neurological and musculoskeletal systems.  -Advised patient since she is doing well with pedicures may continue with that for her routine nail care  however should take breaks from nail polish to prevent worsening of the white streaks to her nails -Advised patient to try topical tea tree oil and a biotin or hair skin and nail supplement to help strengthen up her nails -Advised patient due to the appearance of the pigmented lesion on the bottom of the heel I will refer her to dermatology for a second opinion on this lesion to see if there is any need for biopsy -Answered all patient questions -Patient to return yearly for diabetic foot exam or sooner if problems or issues arise.  Landis Martins, DPM

## 2021-04-01 ENCOUNTER — Other Ambulatory Visit: Payer: Self-pay

## 2021-04-01 ENCOUNTER — Ambulatory Visit (INDEPENDENT_AMBULATORY_CARE_PROVIDER_SITE_OTHER): Payer: Medicare Other

## 2021-04-01 ENCOUNTER — Ambulatory Visit: Payer: Medicare Other | Admitting: Podiatry

## 2021-04-01 ENCOUNTER — Encounter: Payer: Self-pay | Admitting: Podiatry

## 2021-04-01 DIAGNOSIS — M9262 Juvenile osteochondrosis of tarsus, left ankle: Secondary | ICD-10-CM

## 2021-04-01 DIAGNOSIS — M6528 Calcific tendinitis, other site: Secondary | ICD-10-CM | POA: Diagnosis not present

## 2021-04-01 DIAGNOSIS — M7732 Calcaneal spur, left foot: Secondary | ICD-10-CM | POA: Diagnosis not present

## 2021-04-01 DIAGNOSIS — M21862 Other specified acquired deformities of left lower leg: Secondary | ICD-10-CM

## 2021-04-01 MED ORDER — METHYLPREDNISOLONE 4 MG PO TBPK: ORAL_TABLET | ORAL | 0 refills | Status: DC

## 2021-04-01 NOTE — Patient Instructions (Signed)
Achilles Tendinitis  ?with Rehab ?Achilles tendinitis is a disorder of the Achilles tendon. The Achilles tendon connects the large calf muscles (Gastrocnemius and Soleus) to the heel bone (calcaneus). This tendon is sometimes called the heel cord. It is important for pushing-off and standing on your toes and is important for walking, running, or jumping. Tendinitis is often caused by overuse and repetitive microtrauma. ?SYMPTOMS ?Pain, tenderness, swelling, warmth, and redness may occur over the Achilles tendon even at rest. ?Pain with pushing off, or flexing or extending the ankle. ?Pain that is worsened after or during activity. ?CAUSES  ?Overuse sometimes seen with rapid increase in exercise programs or in sports requiring running and jumping. ?Poor physical conditioning (strength and flexibility or endurance). ?Running sports, especially training running down hills. ?Inadequate warm-up before practice or play or failure to stretch before participation. ?Injury to the tendon. ?PREVENTION  ?Warm up and stretch before practice or competition. ?Allow time for adequate rest and recovery between practices and competition. ?Keep up conditioning. ?Keep up ankle and leg flexibility. ?Improve or keep muscle strength and endurance. ?Improve cardiovascular fitness. ?Use proper technique. ?Use proper equipment (shoes, skates). ?To help prevent recurrence, taping, protective strapping, or an adhesive bandage may be recommended for several weeks after healing is complete. ?PROGNOSIS  ?Recovery may take weeks to several months to heal. ?Longer recovery is expected if symptoms have been prolonged. ?Recovery is usually quicker if the inflammation is due to a direct blow as compared with overuse or sudden strain. ?RELATED COMPLICATIONS  ?Healing time will be prolonged if the condition is not correctly treated. The injury must be given plenty of time to heal. ?Symptoms can reoccur if activity is resumed too soon. ?Untreated,  tendinitis may increase the risk of tendon rupture requiring additional time for recovery and possibly surgery. ?TREATMENT  ?The first treatment consists of rest anti-inflammatory medication, and ice to relieve the pain. ?Stretching and strengthening exercises after resolution of pain will likely help reduce the risk of recurrence. Referral to a physical therapist or athletic trainer for further evaluation and treatment may be helpful. ?A walking boot or cast may be recommended to rest the Achilles tendon. This can help break the cycle of inflammation and microtrauma. ?Arch supports (orthotics) may be prescribed or recommended by your caregiver as an adjunct to therapy and rest. ?Surgery to remove the inflamed tendon lining or degenerated tendon tissue is rarely necessary and has shown less than predictable results. ?MEDICATION  ?Nonsteroidal anti-inflammatory medications, such as aspirin and ibuprofen, may be used for pain and inflammation relief. Do not take within 7 days before surgery. Take these as directed by your caregiver. Contact your caregiver immediately if any bleeding, stomach upset, or signs of allergic reaction occur. Other minor pain relievers, such as acetaminophen, may also be used. ?Pain relievers may be prescribed as necessary by your caregiver. Do not take prescription pain medication for longer than 4 to 7 days. Use only as directed and only as much as you need. ?Cortisone injections are rarely indicated. Cortisone injections may weaken tendons and predispose to rupture. It is better to give the condition more time to heal than to use them. ?HEAT AND COLD ?Cold is used to relieve pain and reduce inflammation for acute and chronic Achilles tendinitis. Cold should be applied for 10 to 15 minutes every 2 to 3 hours for inflammation and pain and immediately after any activity that aggravates your symptoms. Use ice packs or an ice massage. ?Heat may be used before performing stretching  and  strengthening activities prescribed by your caregiver. Use a heat pack or a warm soak. ?SEEK MEDICAL CARE IF: ?Symptoms get worse or do not improve in 2 weeks despite treatment. ?New, unexplained symptoms develop. Drugs used in treatment may produce side effects. ? ? ?

## 2021-04-01 NOTE — Progress Notes (Signed)
?  Subjective:  ?Patient ID: Kelsey Conway, female    DOB: [birth year],  MRN: [id number] ? ?Chief Complaint  ?Patient presents with  ? Foot Pain  ?  Left ankle pain started early Saturday morning no known injuries pain and swelling throbbing sensation up the back of the leg   ? ? ?76 y.o. female presents with the above complaint. History confirmed with patient.  She noticed when she woke up Saturday morning that she was having severe pain in the back of the heel and Achilles tendon.  It was painful with pressure.  No known injury that she knows of.  Has been so painful she can hardly walk ? ?Objective:  ?Physical Exam: ?warm, good capillary refill, no trophic changes or ulcerative lesions, normal DP and PT pulses, and normal sensory exam. ?Left Foot: gastrocnemius equinus is noted with a positive silverskiold test and she has severe tenderness in the Achilles tendon mostly in the mid substance watershed area some swelling here, tendon is intact no evidence of rupture she has a negative Homans' sign, less tender at the insertion ? ?Radiographs: ?Multiple views x-ray of the left foot: Radiographs show intrasubstance tendinosis calcifications and a posterior heel spur with also a Haglund's deformity ?Assessment:  ? ?1. 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 Gastrocnemius equinus of left lower extremity   ?3. Heel spur, left   ?4. Haglund's deformity, left   ? ? ? ?Plan:  ?Patient was evaluated and treated and all questions answered. ? ?Discussed the etiology and treatment options for Achilles tendinitis including stretching, formal physical therapy with an eccentric exercises therapy plan, supportive shoegears such as a running shoe or sneaker, heel lifts, topical and oral medications.  We also discussed that I do not routinely perform injections in this area because of the risk of an increased damage or rupture of the tendon.  We also discussed the role of surgical treatment of this for patients who do  not improve after exhausting non-surgical treatment options. ? ?-XR reviewed with patient ?-Educated on stretching and icing of the affected limb. ?-Rx for Medrol 6-day taper. Advised on risks, benefits, and alternatives of the medication ?-Can also take OTC NSAIDs.  Would like to avoid prescription high-dose NSAIDs currently due to her history of diabetes and she has had previous issues with gastric reflux before. ?-WBAT in a Cam boot which I dispensed today to support and offload the tendon and allow it to rest.  ?- I also recommend Voltaren gel.  Hopefully by next visit we will be able to transition back to regular shoe gear and begin physical therapy. ? ? ?Return in about 4 weeks (around 04/29/2021) for re-check Achilles tendon.  ? ?

## 2021-04-05 ENCOUNTER — Ambulatory Visit
Admission: RE | Admit: 2021-04-05 | Discharge: 2021-04-05 | Disposition: A | Discharge status: Home / Self Care | Payer: Medicare Other | Source: Ambulatory Visit | Attending: Family Medicine | Admitting: Family Medicine

## 2021-04-05 DIAGNOSIS — Z1231 Encounter for screening mammogram for malignant neoplasm of breast: Secondary | ICD-10-CM

## 2021-04-29 ENCOUNTER — Ambulatory Visit: Payer: Medicare Other | Admitting: Podiatry

## 2021-09-02 ENCOUNTER — Other Ambulatory Visit: Payer: Self-pay | Admitting: Internal Medicine

## 2021-09-02 DIAGNOSIS — I10 Essential (primary) hypertension: Secondary | ICD-10-CM

## 2021-09-02 DIAGNOSIS — E559 Vitamin D deficiency, unspecified: Secondary | ICD-10-CM

## 2021-09-02 DIAGNOSIS — N1832 Chronic kidney disease, stage 3b: Secondary | ICD-10-CM

## 2021-09-02 DIAGNOSIS — N1831 Chronic kidney disease, stage 3a: Secondary | ICD-10-CM

## 2021-09-06 ENCOUNTER — Ambulatory Visit
Admission: RE | Admit: 2021-09-06 | Discharge: 2021-09-06 | Disposition: A | Discharge status: Home / Self Care | Payer: Medicare Other | Source: Ambulatory Visit | Attending: Internal Medicine | Admitting: Internal Medicine

## 2021-09-06 DIAGNOSIS — I10 Essential (primary) hypertension: Secondary | ICD-10-CM

## 2021-09-06 DIAGNOSIS — E559 Vitamin D deficiency, unspecified: Secondary | ICD-10-CM

## 2021-09-06 DIAGNOSIS — N1831 Chronic kidney disease, stage 3a: Secondary | ICD-10-CM

## 2021-09-06 DIAGNOSIS — N1832 Chronic kidney disease, stage 3b: Secondary | ICD-10-CM

## 2022-01-24 HISTORY — PX: COLONOSCOPY: SHX174

## 2022-03-29 ENCOUNTER — Ambulatory Visit (INDEPENDENT_AMBULATORY_CARE_PROVIDER_SITE_OTHER): Payer: Medicare Other

## 2022-03-29 ENCOUNTER — Encounter: Payer: Self-pay | Admitting: Orthopaedic Surgery

## 2022-03-29 ENCOUNTER — Ambulatory Visit (INDEPENDENT_AMBULATORY_CARE_PROVIDER_SITE_OTHER): Payer: Medicare Other | Admitting: Orthopaedic Surgery

## 2022-03-29 VITALS — BP 163/74 | HR 60 | Ht 64.0 in | Wt 163.0 lb

## 2022-03-29 DIAGNOSIS — M545 Low back pain, unspecified: Secondary | ICD-10-CM

## 2022-03-29 DIAGNOSIS — M48062 Spinal stenosis, lumbar region with neurogenic claudication: Secondary | ICD-10-CM | POA: Diagnosis not present

## 2022-03-29 DIAGNOSIS — G8929 Other chronic pain: Secondary | ICD-10-CM

## 2022-03-29 DIAGNOSIS — M48061 Spinal stenosis, lumbar region without neurogenic claudication: Secondary | ICD-10-CM | POA: Insufficient documentation

## 2022-03-29 NOTE — Addendum Note (Signed)
Addended by: Meyer Cory on: 03/29/2022 11:11 AM   Modules accepted: Orders

## 2022-03-29 NOTE — Progress Notes (Signed)
Office Visit Note   Patient: Kelsey Conway           Date of Birth: 1945-12-13           MRN: [id number] Visit Date: 03/29/2022              Requested by: Vania Rea, FNP [address] [place],  [place] [address] PCP: Vania Rea, FNP   Assessment & Plan: Visit Diagnoses:  1. Chronic bilateral low back pain, unspecified whether sciatica present   2. Spinal stenosis of lumbar region with neurogenic claudication     Plan: Patient is already scheduled for physical therapy.  She can follow-up with Dr. Willia Craze in 6 to 8 weeks to discuss decompression and fusion for her stenosis and anterolisthesis.  Pathophysiology discussed with patient.  Treatment options discussed.  She can follow-up with Dr. Christell Constant after therapy.  Follow-Up Instructions: No follow-ups on file.   Orders:  Orders Placed This Encounter  Procedures   XR Lumbar Spine Complete   No orders of the defined types were placed in this encounter.     Procedures: No procedures performed   Clinical Data: No additional findings.   Subjective: Chief Complaint  Patient presents with   Lower Back - Pain    HPI 77 year old female with some hypertension seen with neurogenic claudication symptoms.  She does better leaning over grocery cart she has to stop and rest and sit and then is able to repeat the distance.  She still can go to the store as long as she leans on a cart.  She has had an MRI scan that shows grade 1 anterolisthesis L3-4 with moderately severe central stenosis and moderate biforaminal narrowing.  L4-5 shows grade 1 anterolisthesis with mild central and moderate biforaminal stenosis.  Patient is here with her husband.  MRI scan disc and report are reviewed today.  MRI was done at [facility] imaging.  Review of Systems positive for hypertension hyperlipidemia.  Type 2 diabetes A1c less than 7.  She is not on insulin.   Objective: Vital Signs: BP (!) 163/74   Pulse 60   Ht 5\' 4"   (1.626 m)   Wt 163 lb (73.9 kg)   BMI 27.98 kg/m   Physical Exam Constitutional:      Appearance: She is well-developed.  HENT:     Head: Normocephalic.     Right Ear: External ear normal.     Left Ear: External ear normal. There is no impacted cerumen.  Eyes:     Pupils: Pupils are equal, round, and reactive to light.  Neck:     Thyroid: No thyromegaly.     Trachea: No tracheal deviation.  Cardiovascular:     Rate and Rhythm: Normal rate.  Pulmonary:     Effort: Pulmonary effort is normal.  Abdominal:     Palpations: Abdomen is soft.  Musculoskeletal:     Cervical back: No rigidity.  Skin:    General: Skin is warm and dry.  Neurological:     Mental Status: She is alert and oriented to person, place, and time.  Psychiatric:        Behavior: Behavior normal.     Ortho Exam patient has some tenderness lumbosacral junction.  Sciatic notch tenderness left greater than right positive straight leg raising 90 degrees anterior tib EHL is intact.  Distal pulses are normal.  Specialty Comments:  No specialty comments available.  Imaging:  [facility] Outside Information  MRI Spine  Lumbar WO IV Contrast  Anatomical Region Laterality Modality  T-spine -- Magnetic Resonance  L-spine -- --  Pelvis -- --   Impression  IMPRESSION:  1.  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  2.  Grade 1 anterolisthesis L3-4 and L4-5.  Electronically Signed by: Abner Greenspan, MD on 03/08/2022 12:45 PM   PMFS History: Patient Active Problem List   Diagnosis Date Noted   Spinal stenosis of lumbar region 03/29/2022   Ataxia 07/07/2019   Constipation 02/25/2019   Class 2 obesity with body mass index (BMI) of 36.0 to 36.9 in adult 05/09/2018   Low back pain 10/04/2016   Chest pain 05/10/2016   Abnormal EKG 05/10/2016   Murmur 05/10/2016   Mixed hyperlipidemia 05/10/2016   Hypertension,  essential, benign 05/03/2016   Insomnia 05/03/2016   Type 2 diabetes mellitus with other specified complication (HCC) 05/03/2016   Generalized osteoarthritis of multiple sites 05/03/2016   Gastric mass 11/05/2014   Past Medical History:  Diagnosis Date   Arthritis    Chicken pox    Diabetes mellitus without complication (HCC)    Family history of polyps in the colon    Frequent headaches    Glaucoma    Heart murmur    Hypertension     Family History  Problem Relation Age of Onset   Diabetes Mother    Hypertension Mother    Hyperlipidemia Mother    Diabetes Sister    Hypertension Sister    Diabetes Brother     Past Surgical History:  Procedure Laterality Date   ABDOMINAL HYSTERECTOMY     CHOLECYSTECTOMY     FOOT SURGERY Left    TUBAL LIGATION     TUMOR REMOVAL  2017   Stomach tumor   Social History   Occupational History   Not on file  Tobacco Use   Smoking status: Never   Smokeless tobacco: Never  Vaping Use   Vaping Use: Never used  Substance and Sexual Activity   Alcohol use: No   Drug use: No   Sexual activity: Not on file

## 2022-03-30 NOTE — Therapy (Unsigned)
OUTPATIENT PHYSICAL THERAPY THORACOLUMBAR EVALUATION   Patient Name: Kelsey Conway MRN: [id number] DOB:[birth year], 77 y.o., female Today's Date: 03/31/2022  END OF SESSION:  PT End of Session - 03/31/22 1307     Visit Number 1    Number of Visits 8    Date for PT Re-Evaluation 05/26/22    Authorization Type [employer] MCR    PT Start Time 1215    PT Stop Time 1300    PT Time Calculation (min) 45 min    Activity Tolerance Patient tolerated treatment well    Behavior During Therapy WFL for tasks assessed/performed             Past Medical History:  Diagnosis Date   Arthritis    Chicken pox    Diabetes mellitus without complication (HCC)    Family history of polyps in the colon    Frequent headaches    Glaucoma    Heart murmur    Hypertension    Past Surgical History:  Procedure Laterality Date   ABDOMINAL HYSTERECTOMY     CHOLECYSTECTOMY     FOOT SURGERY Left    TUBAL LIGATION     TUMOR REMOVAL  2017   Stomach tumor   Patient Active Problem List   Diagnosis Date Noted   Spinal stenosis of lumbar region 03/29/2022   Ataxia 07/07/2019   Constipation 02/25/2019   Class 2 obesity with body mass index (BMI) of 36.0 to 36.9 in adult 05/09/2018   Low back pain 10/04/2016   Chest pain 05/10/2016   Abnormal EKG 05/10/2016   Murmur 05/10/2016   Mixed hyperlipidemia 05/10/2016   Hypertension, essential, benign 05/03/2016   Insomnia 05/03/2016   Type 2 diabetes mellitus with other specified complication (HCC) 05/03/2016   Generalized osteoarthritis of multiple sites 05/03/2016   Gastric mass 11/05/2014    PCP: Vania Rea, FNP   REFERRING PROVIDER: Vania Rea, FNP   REFERRING DIAG: M48.00 (ICD-10-CM) - Spinal stenosis, site unspecified  Rationale for Evaluation and Treatment: Rehabilitation  THERAPY DIAG:  Other low back pain  Spinal stenosis of lumbar region with neurogenic claudication  Other abnormalities of gait and mobility  ONSET DATE:  chronic  SUBJECTIVE:                                                                                                                                                                                           SUBJECTIVE STATEMENT: HPI 77 year old female with some hypertension seen with neurogenic claudication symptoms.  She does better leaning over grocery cart she has to stop and rest and sit and then is able to repeat the  distance.  She still can go to the store as long as she leans on a cart.  She has had an MRI scan that shows grade 1 anterolisthesis L3-4 with moderately severe central stenosis and moderate biforaminal narrowing.  L4-5 shows grade 1 anterolisthesis with mild central and moderate biforaminal stenosis.  Patient is here with her husband.  MRI scan disc and report are reviewed today.  MRI was done at [facility] imaging.  PERTINENT HISTORY:  Plan: Patient is already scheduled for physical therapy.  She can follow-up with Dr. Willia Craze in 6 to 8 weeks to discuss decompression and fusion for her stenosis and anterolisthesis.  Pathophysiology discussed with patient.  Treatment options discussed.  She can follow-up with Dr. Christell Constant after therapy.   PAIN:  Are you having pain? Yes: NPRS scale: 10/10 Pain location: low back Pain description: ache Aggravating factors: prolonged standing Relieving factors: rest and Doans pills  PRECAUTIONS: Other: stenosis and claudication  WEIGHT BEARING RESTRICTIONS: No  FALLS:  Has patient fallen in last 6 months? Yes. Number of falls 1  accidental in nature   LIVING ENVIRONMENT: Lives with: lives with their spouse Lives in: House/apartment Stairs:  yes Has following equipment at home: Single point cane  OCCUPATION: retired    PLOF: Independent with household mobility with device  PATIENT GOALS: To become more mobile and increase my activity tolerance  NEXT MD VISIT: 6 weeks  OBJECTIVE:   DIAGNOSTIC FINDINGS:  MRI Spine  Lumbar WO IV  Contrast   Anatomical Region Laterality Modality  T-spine -- Magnetic Resonance  L-spine -- --  Pelvis -- --    Impression   IMPRESSION:  1.  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  2.  Grade 1 anterolisthesis L3-4 and L4-5.  Electronically Signed by: Abner Greenspan, MD on 03/08/2022 12:45 PM  PATIENT SURVEYS:  FOTO 56(61 predicted)  SCREENING FOR RED FLAGS: Bowel or bladder incontinence: No    MUSCLE LENGTH: Hamstrings: Right 70 deg; Left 70 deg  POSTURE: rounded shoulders, forward head, and increased thoracic kyphosis  PALPATION: deferred  LUMBAR ROM:   AROM eval  Flexion   Extension   Right lateral flexion   Left lateral flexion   Right rotation 50%  Left rotation 75%   (Blank rows = not tested)    LOWER EXTREMITY MMT:  Active  Right eval Left eval  Hip flexion 4 4  Hip extension 4 4  Hip abduction 4 4  Hip adduction    Hip internal rotation    Hip external rotation    Knee flexion 4 4  Knee extension 4 4  Ankle dorsiflexion    Ankle plantarflexion 4 4  Ankle inversion    Ankle eversion    Core/trunk 3+ 3+   (Blank rows = not tested)    LOWER EXTREMITY ROM:   MMT Right eval Left eval  Hip flexion    Hip extension    Hip abduction    Hip adduction    Hip internal rotation 10d 10d  Hip external rotation    Knee flexion    Knee extension    Ankle dorsiflexion    Ankle plantarflexion    Ankle inversion    Ankle eversion     (Blank rows = not tested)  LUMBAR SPECIAL TESTS:  Straight leg raise test: Negative, Slump test: Positive, and FABER test: Positive  FUNCTIONAL TESTS:  5 times sit to stand: 26s arms crossed  GAIT: Distance walked: 73ftx2 Assistive device utilized: None Level of assistance: Complete Independence Comments: slow cadence  TODAY'S TREATMENT:                                                                                                                               DATE: 03/31/22    PATIENT EDUCATION:  Education details: Discussed eval findings, rehab rationale and POC and patient is in agreement  Person educated: Patient and Spouse Education method: Explanation Education comprehension: verbalized understanding and needs further education  HOME EXERCISE PROGRAM: Access Code: [email or web address] URL: [email or web address] Date: 03/31/2022 Prepared by: Gustavus Bryant  Exercises - Seated Sciatic Tensioner  - 3 x daily - 5 x weekly - 10 reps - Seated Transversus Abdominis Bracing  - 3 x daily - 5 x weekly - 1 sets - 10 reps - 3s hold - Standing [employer] at [employer]  - 3 x daily - 5 x weekly - 1 sets - 10 reps  ASSESSMENT:  CLINICAL IMPRESSION: Patient is a 77 y.o. female who was seen today for physical therapy evaluation and treatment for chronic low back pain due to underlying degenerative changes including spinal canal stenosis.  She describes limited tolerance to standing and walking, pain with bending and spinal flexion as well as symptoms with trunk extension.  Rotational motions are tolerated.   5x STS time above functional, positive L slump sign and restricted B hip IR noted.  Patient is a good candidate for OPPT to improve noted deficits and improve function prior to upcoming surgery.  OBJECTIVE IMPAIRMENTS: decreased activity tolerance, decreased endurance, decreased mobility, decreased ROM, decreased strength, improper body mechanics, postural dysfunction, and pain.   ACTIVITY LIMITATIONS: carrying, lifting, bending, standing, squatting, sleeping, and stairs  PARTICIPATION LIMITATIONS: meal prep, cleaning, and shopping  PERSONAL FACTORS: Age, Fitness, Past/current experiences, and Time since onset of injury/illness/exacerbation are also affecting patient's functional outcome.   REHAB POTENTIAL: Fair based on chronicity and upcomming surgery  CLINICAL DECISION MAKING:  Stable/uncomplicated  EVALUATION COMPLEXITY: Low   GOALS: Goals reviewed with patient? Yes  SHORT TERM GOALS: Target date: 04/14/2022   Patient to demonstrate independence in HEP  Baseline: [email or web address] Goal status: INITIAL  2.  Negative L slump test Baseline: positive L slump for hamstring symptoms Goal status: INITIAL   LONG TERM GOALS: Target date: 04/28/2022  Decrease 5x STS to 20s arms crossed Baseline: 26s arms crossed Goal status: INITIAL  2.  Increase FOTO score to 61 Baseline: 56 Goal status: INITIAL  3.  Increase core strength to 4/5 Baseline: 3+/5 Goal status: INITIAL  4.  20d PROM B hip IR Baseline: 10d B Goal status: INITIAL    PLAN:  PT FREQUENCY: 2x/week  PT DURATION: 6 weeks  PLANNED INTERVENTIONS: Therapeutic exercises, Therapeutic activity, Neuromuscular re-education, Balance training, Gait training, Patient/Family education, Self Care, Joint mobilization, Stair training, Aquatic Therapy, Dry Needling, Electrical stimulation, Manual therapy, and Re-evaluation.  PLAN FOR NEXT SESSION: HEP review  and update, stretching and flexibility tasks, aerobic work, manual as appropriate, core and posture training   Hildred Laser, PT 03/31/2022, 1:08 PM

## 2022-03-31 ENCOUNTER — Other Ambulatory Visit: Payer: Self-pay

## 2022-03-31 ENCOUNTER — Ambulatory Visit: Payer: Medicare Other | Attending: Family Medicine

## 2022-03-31 DIAGNOSIS — R2689 Other abnormalities of gait and mobility: Secondary | ICD-10-CM | POA: Insufficient documentation

## 2022-03-31 DIAGNOSIS — M5459 Other low back pain: Secondary | ICD-10-CM | POA: Diagnosis present

## 2022-03-31 DIAGNOSIS — M48062 Spinal stenosis, lumbar region with neurogenic claudication: Secondary | ICD-10-CM | POA: Insufficient documentation

## 2022-04-04 ENCOUNTER — Ambulatory Visit: Payer: Medicare Other

## 2022-04-04 DIAGNOSIS — M5459 Other low back pain: Secondary | ICD-10-CM

## 2022-04-04 DIAGNOSIS — M48062 Spinal stenosis, lumbar region with neurogenic claudication: Secondary | ICD-10-CM

## 2022-04-04 DIAGNOSIS — R2689 Other abnormalities of gait and mobility: Secondary | ICD-10-CM

## 2022-04-04 NOTE — Therapy (Signed)
OUTPATIENT PHYSICAL THERAPY TREATMENT NOTE   Patient Name: Kelsey Conway MRN: [id number] DOB:21-Feb-1945, 77 y.o., female Today's Date: 04/04/2022  PCP: Vania Rea, FNP   REFERRING PROVIDER: Vania Rea, FNP    END OF SESSION:   PT End of Session - 04/04/22 1355     Visit Number 2    Number of Visits 8    Date for PT Re-Evaluation 05/26/22    Authorization Type UHC MCR    PT Start Time 1400    PT Stop Time 1440    PT Time Calculation (min) 40 min    Activity Tolerance Patient tolerated treatment well    Behavior During Therapy WFL for tasks assessed/performed             Past Medical History:  Diagnosis Date   Arthritis    Chicken pox    Diabetes mellitus without complication (HCC)    Family history of polyps in the colon    Frequent headaches    Glaucoma    Heart murmur    Hypertension    Past Surgical History:  Procedure Laterality Date   ABDOMINAL HYSTERECTOMY     CHOLECYSTECTOMY     FOOT SURGERY Left    TUBAL LIGATION     TUMOR REMOVAL  2017   Stomach tumor   Patient Active Problem List   Diagnosis Date Noted   Spinal stenosis of lumbar region 03/29/2022   Ataxia 07/07/2019   Constipation 02/25/2019   Class 2 obesity with body mass index (BMI) of 36.0 to 36.9 in adult 05/09/2018   Low back pain 10/04/2016   Chest pain 05/10/2016   Abnormal EKG 05/10/2016   Murmur 05/10/2016   Mixed hyperlipidemia 05/10/2016   Hypertension, essential, benign 05/03/2016   Insomnia 05/03/2016   Type 2 diabetes mellitus with other specified complication (HCC) 05/03/2016   Generalized osteoarthritis of multiple sites 05/03/2016   Gastric mass 11/05/2014    REFERRING DIAG: M48.00 (ICD-10-CM) - Spinal stenosis, site unspecified   THERAPY DIAG:  Other low back pain  Spinal stenosis of lumbar region with neurogenic claudication  Other abnormalities of gait and mobility  Rationale for Evaluation and Treatment Rehabilitation  PERTINENT HISTORY:   Plan: Patient is already scheduled for physical therapy.  She can follow-up with Dr. Willia Craze in 6 to 8 weeks to discuss decompression and fusion for her stenosis and anterolisthesis.  Pathophysiology discussed with patient.  Treatment options discussed.  She can follow-up with Dr. Christell Constant after therapy.    PRECAUTIONS: Other: stenosis and claudication   SUBJECTIVE:  SUBJECTIVE STATEMENT:  Pt presents to PT with reports of continued lower back pain. Has been compliant with HEP with no adverse effect.    PAIN:  Are you having pain?  Yes: NPRS scale: 10/10 Pain location: low back Pain description: ache Aggravating factors: prolonged standing Relieving factors: rest and Doans pills   OBJECTIVE: (objective measures completed at initial evaluation unless otherwise dated)  DIAGNOSTIC FINDINGS:  MRI Spine  Lumbar WO IV Contrast   Anatomical Region Laterality Modality  T-spine -- Magnetic Resonance  L-spine -- --  Pelvis -- --    Impression   IMPRESSION:  1.  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  2.  Grade 1 anterolisthesis L3-4 and L4-5.  Electronically Signed by: Abner Greenspan, MD on 03/08/2022 12:45 PM   PATIENT SURVEYS:  FOTO 56(61 predicted)   SCREENING FOR RED FLAGS: Bowel or bladder incontinence: No       MUSCLE LENGTH: Hamstrings: Right 70 deg; Left 70 deg   POSTURE: rounded shoulders, forward head, and increased thoracic kyphosis   PALPATION: deferred   LUMBAR ROM:    AROM eval  Flexion    Extension    Right lateral flexion    Left lateral flexion    Right rotation 50%  Left rotation 75%   (Blank rows = not tested)     LOWER EXTREMITY MMT:  Active  Right eval Left eval  Hip  flexion 4 4  Hip extension 4 4  Hip abduction 4 4  Hip adduction      Hip internal rotation      Hip external rotation      Knee flexion 4 4  Knee extension 4 4  Ankle dorsiflexion      Ankle plantarflexion 4 4  Ankle inversion      Ankle eversion      Core/trunk 3+ 3+   (Blank rows = not tested)     LOWER EXTREMITY ROM:   MMT Right eval Left eval  Hip flexion      Hip extension      Hip abduction      Hip adduction      Hip internal rotation 10d 10d  Hip external rotation      Knee flexion      Knee extension      Ankle dorsiflexion      Ankle plantarflexion      Ankle inversion      Ankle eversion       (Blank rows = not tested)   LUMBAR SPECIAL TESTS:  Straight leg raise test: Negative, Slump test: Positive, and FABER test: Positive   FUNCTIONAL TESTS:  5 times sit to stand: 26s arms crossed   GAIT: Distance walked: 83ftx2 Assistive device utilized: None Level of assistance: Complete Independence Comments: slow cadence   TODAY'S TREATMENT:  [facility] Adult PT Treatment:                                                DATE: 04/04/2022 Therapeutic Exercise: NuStep lvl 5 UE/LE x 5 min while taking subjective Supine PPT x 10 - 3" hold Supine ball squeeze  Supine clamshell 2x15 GTB Supine march 2x20 GTB Bridge 2x10 STS 2x10 - high table Standing hip adb/ext 2x10 each   PATIENT EDUCATION:  Education details: Discussed eval  findings, rehab rationale and POC and patient is in agreement  Person educated: Patient and Spouse Education method: Explanation Education comprehension: verbalized understanding and needs further education   HOME EXERCISE PROGRAM: Access Code: [email or web address] URL: [email or web address] Date: 03/31/2022 Prepared by: Gustavus Bryant   Exercises - Seated Sciatic Tensioner  - 3 x daily - 5 x weekly - 10 reps - Seated Transversus Abdominis Bracing  - 3 x daily - 5 x weekly - 1 sets - 10 reps - 3s hold - Standing [employer] at  [employer]  - 3 x daily - 5 x weekly - 1 sets - 10 reps   ASSESSMENT:   CLINICAL IMPRESSION: Pt was able to complete all prescribed exercises with no adverse effect or increase in pain. Therapy focused on improving core and proximal hip strength in order to improve comfort and functional ability. Pt will continue to progress pt as tolerated per POC.    OBJECTIVE IMPAIRMENTS: decreased activity tolerance, decreased endurance, decreased mobility, decreased ROM, decreased strength, improper body mechanics, postural dysfunction, and pain.    ACTIVITY LIMITATIONS: carrying, lifting, bending, standing, squatting, sleeping, and stairs   PARTICIPATION LIMITATIONS: meal prep, cleaning, and shopping   PERSONAL FACTORS: Age, Fitness, Past/current experiences, and Time since onset of injury/illness/exacerbation are also affecting patient's functional outcome.    REHAB POTENTIAL: Fair based on chronicity and upcomming surgery   CLINICAL DECISION MAKING: Stable/uncomplicated   EVALUATION COMPLEXITY: Low     GOALS: Goals reviewed with patient? Yes   SHORT TERM GOALS: Target date: 04/14/2022   Patient to demonstrate independence in HEP  Baseline: [id number] Goal status: INITIAL   2.  Negative L slump test Baseline: positive L slump for hamstring symptoms Goal status: INITIAL     LONG TERM GOALS: Target date: 04/28/2022   Decrease 5x STS to 20s arms crossed Baseline: 26s arms crossed Goal status: INITIAL   2.  Increase FOTO score to 61 Baseline: 56 Goal status: INITIAL   3.  Increase core strength to 4/5 Baseline: 3+/5 Goal status: INITIAL   4.  20d PROM B hip IR Baseline: 10d B Goal status: INITIAL       PLAN:   PT FREQUENCY: 2x/week   PT DURATION: 6 weeks   PLANNED INTERVENTIONS: Therapeutic exercises, Therapeutic activity, Neuromuscular re-education, Balance training, Gait training, Patient/Family education, Self Care, Joint mobilization, Stair training, Aquatic Therapy, Dry  Needling, Electrical stimulation, Manual therapy, and Re-evaluation.   PLAN FOR NEXT SESSION: HEP review and update, stretching and flexibility tasks, aerobic work, manual as appropriate, core and posture training   Eloy End, PT 04/04/2022, 2:46 PM

## 2022-04-06 ENCOUNTER — Ambulatory Visit: Payer: Medicare Other

## 2022-04-06 DIAGNOSIS — R2689 Other abnormalities of gait and mobility: Secondary | ICD-10-CM

## 2022-04-06 DIAGNOSIS — M5459 Other low back pain: Secondary | ICD-10-CM | POA: Diagnosis not present

## 2022-04-06 DIAGNOSIS — M48062 Spinal stenosis, lumbar region with neurogenic claudication: Secondary | ICD-10-CM

## 2022-04-06 NOTE — Therapy (Signed)
OUTPATIENT PHYSICAL THERAPY TREATMENT NOTE   Patient Name: Kelsey Conway MRN: [id number] DOB:06-07-1945, 77 y.o., female Today's Date: 04/06/2022  PCP: Vania Rea, FNP   REFERRING PROVIDER: Vania Rea, FNP    END OF SESSION:   PT End of Session - 04/06/22 1554     Visit Number 3    Number of Visits 8    Date for PT Re-Evaluation 05/26/22    Authorization Type UHC MCR    PT Start Time 1600    PT Stop Time 1639    PT Time Calculation (min) 39 min    Activity Tolerance Patient tolerated treatment well    Behavior During Therapy WFL for tasks assessed/performed              Past Medical History:  Diagnosis Date   Arthritis    Chicken pox    Diabetes mellitus without complication (HCC)    Family history of polyps in the colon    Frequent headaches    Glaucoma    Heart murmur    Hypertension    Past Surgical History:  Procedure Laterality Date   ABDOMINAL HYSTERECTOMY     CHOLECYSTECTOMY     FOOT SURGERY Left    TUBAL LIGATION     TUMOR REMOVAL  2017   Stomach tumor   Patient Active Problem List   Diagnosis Date Noted   Spinal stenosis of lumbar region 03/29/2022   Ataxia 07/07/2019   Constipation 02/25/2019   Class 2 obesity with body mass index (BMI) of 36.0 to 36.9 in adult 05/09/2018   Low back pain 10/04/2016   Chest pain 05/10/2016   Abnormal EKG 05/10/2016   Murmur 05/10/2016   Mixed hyperlipidemia 05/10/2016   Hypertension, essential, benign 05/03/2016   Insomnia 05/03/2016   Type 2 diabetes mellitus with other specified complication (HCC) 05/03/2016   Generalized osteoarthritis of multiple sites 05/03/2016   Gastric mass 11/05/2014    REFERRING DIAG: M48.00 (ICD-10-CM) - Spinal stenosis, site unspecified   THERAPY DIAG:  Other low back pain  Spinal stenosis of lumbar region with neurogenic claudication  Other abnormalities of gait and mobility  Rationale for Evaluation and Treatment Rehabilitation  PERTINENT HISTORY:   Plan: Patient is already scheduled for physical therapy.  She can follow-up with Dr. Willia Craze in 6 to 8 weeks to discuss decompression and fusion for her stenosis and anterolisthesis.  Pathophysiology discussed with patient.  Treatment options discussed.  She can follow-up with Dr. Christell Constant after therapy.    PRECAUTIONS: Other: stenosis and claudication   SUBJECTIVE:  SUBJECTIVE STATEMENT:  Pt presents to PT with reports of continued LBP, although it is less today. Has been compliant with HEP.     PAIN:  Are you having pain?  Yes: NPRS scale: 8/10 Pain location: low back Pain description: ache Aggravating factors: prolonged standing Relieving factors: rest and Doans pills   OBJECTIVE: (objective measures completed at initial evaluation unless otherwise dated)  DIAGNOSTIC FINDINGS:  MRI Spine  Lumbar WO IV Contrast   Anatomical Region Laterality Modality  T-spine -- Magnetic Resonance  L-spine -- --  Pelvis -- --    Impression   IMPRESSION:  1.  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  2.  Grade 1 anterolisthesis L3-4 and L4-5.  Electronically Signed by: Abner Greenspan, MD on 03/08/2022 12:45 PM   PATIENT SURVEYS:  FOTO 56(61 predicted)   SCREENING FOR RED FLAGS: Bowel or bladder incontinence: No       MUSCLE LENGTH: Hamstrings: Right 70 deg; Left 70 deg   POSTURE: rounded shoulders, forward head, and increased thoracic kyphosis   PALPATION: deferred   LUMBAR ROM:    AROM eval  Flexion    Extension    Right lateral flexion    Left lateral flexion    Right rotation 50%  Left rotation 75%   (Blank rows = not tested)     LOWER EXTREMITY MMT:  Active  Right eval Left eval  Hip flexion 4 4   Hip extension 4 4  Hip abduction 4 4  Hip adduction      Hip internal rotation      Hip external rotation      Knee flexion 4 4  Knee extension 4 4  Ankle dorsiflexion      Ankle plantarflexion 4 4  Ankle inversion      Ankle eversion      Core/trunk 3+ 3+   (Blank rows = not tested)     LOWER EXTREMITY ROM:   MMT Right eval Left eval  Hip flexion      Hip extension      Hip abduction      Hip adduction      Hip internal rotation 10d 10d  Hip external rotation      Knee flexion      Knee extension      Ankle dorsiflexion      Ankle plantarflexion      Ankle inversion      Ankle eversion       (Blank rows = not tested)   LUMBAR SPECIAL TESTS:  Straight leg raise test: Negative, Slump test: Positive, and FABER test: Positive   FUNCTIONAL TESTS:  5 times sit to stand: 26s arms crossed   GAIT: Distance walked: 14ftx2 Assistive device utilized: None Level of assistance: Complete Independence Comments: slow cadence   TODAY'S TREATMENT:  [facility] Adult PT Treatment:                                                DATE: 04/06/2022 Therapeutic Exercise: NuStep lvl 5 UE/LE x 5 min while taking subjective Supine PPT x 10 - 3" hold Supine PPT with ball 2x10 Supine clamshell 2x15 GTB Supine SLR x 10 each - R difficult  Supine march 2x20 GTB Bridge 2x10 STS 2x10 - high table Standing hip adb/ext 2x10  each 2# Lateral walk 2# x 3 laps at counter  [facility] Adult PT Treatment:                                                DATE: 04/04/2022 Therapeutic Exercise: NuStep lvl 5 UE/LE x 5 min while taking subjective Supine PPT x 10 - 3" hold Supine ball squeeze  Supine clamshell 2x15 GTB Supine march 2x20 GTB Bridge 2x10 STS 2x10 - high table Standing hip adb/ext 2x10 each   PATIENT EDUCATION:  Education details: Discussed eval findings, rehab rationale and POC and patient is in agreement  Person educated: Patient and Spouse Education method: Explanation Education  comprehension: verbalized understanding and needs further education   HOME EXERCISE PROGRAM: Access Code: [email or web address] URL: [email or web address] Date: 03/31/2022 Prepared by: Gustavus Bryant   Exercises - Seated Sciatic Tensioner  - 3 x daily - 5 x weekly - 10 reps - Seated Transversus Abdominis Bracing  - 3 x daily - 5 x weekly - 1 sets - 10 reps - 3s hold - Standing [employer] at [employer]  - 3 x daily - 5 x weekly - 1 sets - 10 reps   ASSESSMENT:   CLINICAL IMPRESSION: Pt was able to complete all prescribed exercises with no adverse effect or increase in pain. Therapy focused on improving core and proximal hip strength in order to improve comfort and functional ability. Pt will continue to progress pt as tolerated per POC.     OBJECTIVE IMPAIRMENTS: decreased activity tolerance, decreased endurance, decreased mobility, decreased ROM, decreased strength, improper body mechanics, postural dysfunction, and pain.    ACTIVITY LIMITATIONS: carrying, lifting, bending, standing, squatting, sleeping, and stairs   PARTICIPATION LIMITATIONS: meal prep, cleaning, and shopping   PERSONAL FACTORS: Age, Fitness, Past/current experiences, and Time since onset of injury/illness/exacerbation are also affecting patient's functional outcome.      GOALS: Goals reviewed with patient? Yes   SHORT TERM GOALS: Target date: 04/14/2022   Patient to demonstrate independence in HEP  Baseline: [id number] Goal status: INITIAL   2.  Negative L slump test Baseline: positive L slump for hamstring symptoms Goal status: INITIAL     LONG TERM GOALS: Target date: 04/28/2022   Decrease 5x STS to 20s arms crossed Baseline: 26s arms crossed Goal status: INITIAL   2.  Increase FOTO score to 61 Baseline: 56 Goal status: INITIAL   3.  Increase core strength to 4/5 Baseline: 3+/5 Goal status: INITIAL   4.  20d PROM B hip IR Baseline: 10d B Goal status: INITIAL       PLAN:   PT FREQUENCY:  2x/week   PT DURATION: 6 weeks   PLANNED INTERVENTIONS: Therapeutic exercises, Therapeutic activity, Neuromuscular re-education, Balance training, Gait training, Patient/Family education, Self Care, Joint mobilization, Stair training, Aquatic Therapy, Dry Needling, Electrical stimulation, Manual therapy, and Re-evaluation.   PLAN FOR NEXT SESSION: HEP review and update, stretching and flexibility tasks, aerobic work, manual as appropriate, core and posture training   Eloy End, PT 04/06/2022, 4:39 PM

## 2022-04-18 NOTE — Therapy (Unsigned)
OUTPATIENT PHYSICAL THERAPY TREATMENT NOTE   Patient Name: Kelsey Conway MRN: [id number] DOB:[birth year], 77 y.o., female Today's Date: 04/20/2022  PCP: Vania Rea, FNP   REFERRING PROVIDER: Vania Rea, FNP    END OF SESSION:   PT End of Session - 04/20/22 0908     Visit Number 4    Number of Visits 8    Date for PT Re-Evaluation 05/26/22    Authorization Type UHC MCR    PT Start Time 0915    PT Stop Time 0955    PT Time Calculation (min) 40 min    Activity Tolerance Patient tolerated treatment well    Behavior During Therapy WFL for tasks assessed/performed              Past Medical History:  Diagnosis Date   Arthritis    Chicken pox    Diabetes mellitus without complication (HCC)    Family history of polyps in the colon    Frequent headaches    Glaucoma    Heart murmur    Hypertension    Past Surgical History:  Procedure Laterality Date   ABDOMINAL HYSTERECTOMY     CHOLECYSTECTOMY     FOOT SURGERY Left    TUBAL LIGATION     TUMOR REMOVAL  2017   Stomach tumor   Patient Active Problem List   Diagnosis Date Noted   Spinal stenosis of lumbar region 03/29/2022   Ataxia 07/07/2019   Constipation 02/25/2019   Class 2 obesity with body mass index (BMI) of 36.0 to 36.9 in adult 05/09/2018   Low back pain 10/04/2016   Chest pain 05/10/2016   Abnormal EKG 05/10/2016   Murmur 05/10/2016   Mixed hyperlipidemia 05/10/2016   Hypertension, essential, benign 05/03/2016   Insomnia 05/03/2016   Type 2 diabetes mellitus with other specified complication (HCC) 05/03/2016   Generalized osteoarthritis of multiple sites 05/03/2016   Gastric mass 11/05/2014    REFERRING DIAG: M48.00 (ICD-10-CM) - Spinal stenosis, site unspecified   THERAPY DIAG:  Other low back pain  Spinal stenosis of lumbar region with neurogenic claudication  Other abnormalities of gait and mobility  Rationale for Evaluation and Treatment Rehabilitation  PERTINENT HISTORY:   Plan: Patient is already scheduled for physical therapy.  She can follow-up with Dr. Willia Craze in 6 to 8 weeks to discuss decompression and fusion for her stenosis and anterolisthesis.  Pathophysiology discussed with patient.  Treatment options discussed.  She can follow-up with Dr. Christell Constant after therapy.    PRECAUTIONS: Other: stenosis and claudication   SUBJECTIVE:  SUBJECTIVE STATEMENT:  Low back pain fluctuates and varies with weather.  Today symptoms present in R hip and extend to R foot.   PAIN:  Are you having pain?  Yes: NPRS scale: 8/10 Pain location: low back Pain description: ache Aggravating factors: prolonged standing Relieving factors: rest and Doans pills   OBJECTIVE: (objective measures completed at initial evaluation unless otherwise dated)  DIAGNOSTIC FINDINGS:  MRI Spine  Lumbar WO IV Contrast   Anatomical Region Laterality Modality  T-spine -- Magnetic Resonance  L-spine -- --  Pelvis -- --    Impression   IMPRESSION:  1.  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  2.  Grade 1 anterolisthesis L3-4 and L4-5.  Electronically Signed by: Abner Greenspan, MD on 03/08/2022 12:45 PM   PATIENT SURVEYS:  FOTO 56(61 predicted)   SCREENING FOR RED FLAGS: Bowel or bladder incontinence: No       MUSCLE LENGTH: Hamstrings: Right 70 deg; Left 70 deg   POSTURE: rounded shoulders, forward head, and increased thoracic kyphosis   PALPATION: deferred   LUMBAR ROM:    AROM eval  Flexion    Extension    Right lateral flexion    Left lateral flexion    Right rotation 50%  Left rotation 75%   (Blank rows = not tested)     LOWER EXTREMITY MMT:  Active  Right eval Left eval  Hip flexion 4 4   Hip extension 4 4  Hip abduction 4 4  Hip adduction      Hip internal rotation      Hip external rotation      Knee flexion 4 4  Knee extension 4 4  Ankle dorsiflexion      Ankle plantarflexion 4 4  Ankle inversion      Ankle eversion      Core/trunk 3+ 3+   (Blank rows = not tested)     LOWER EXTREMITY ROM:   MMT Right eval Left eval  Hip flexion      Hip extension      Hip abduction      Hip adduction      Hip internal rotation 10d 10d  Hip external rotation      Knee flexion      Knee extension      Ankle dorsiflexion      Ankle plantarflexion      Ankle inversion      Ankle eversion       (Blank rows = not tested)   LUMBAR SPECIAL TESTS:  Straight leg raise test: Negative, Slump test: Positive, and FABER test: Positive   FUNCTIONAL TESTS:  5 times sit to stand: 26s arms crossed   GAIT: Distance walked: 23ftx2 Assistive device utilized: None Level of assistance: Complete Independence Comments: slow cadence   TODAY'S TREATMENT:  [facility] Adult PT Treatment:                                                DATE: 04/20/22 Therapeutic Exercise: NuStep lvl 4 8 min Seated hamstring stretch 30s x2 Bil STS arms crossed with OH reach 5x2 Supine PPT x 10 - 3" hold Supine PPT with alt marching 10/10 (rest in between reps) PPT with alternation UE flexion 10/10(rest in between reps) Supine hip fallouts GTB 15x Bil 15/15  unilaterally Supine QL stretch 30s x2 Bil L S/L clams 15x2 R only  [facility] Adult PT Treatment:                                                DATE: 04/06/2022 Therapeutic Exercise: NuStep lvl 5 UE/LE x 5 min while taking subjective Supine PPT x 10 - 3" hold Supine PPT with ball 2x10 Supine clamshell 2x15 GTB Supine SLR x 10 each - R difficult  Supine march 2x20 GTB Bridge 2x10 STS 2x10 - high table Standing hip adb/ext 2x10 each 2# Lateral walk 2# x 3 laps at counter  [facility] Adult PT Treatment:                                                DATE:  04/04/2022 Therapeutic Exercise: NuStep lvl 5 UE/LE x 5 min while taking subjective Supine PPT x 10 - 3" hold Supine ball squeeze  Supine clamshell 2x15 GTB Supine march 2x20 GTB Bridge 2x10 STS 2x10 - high table Standing hip adb/ext 2x10 each   PATIENT EDUCATION:  Education details: Discussed eval findings, rehab rationale and POC and patient is in agreement  Person educated: Patient and Spouse Education method: Explanation Education comprehension: verbalized understanding and needs further education   HOME EXERCISE PROGRAM: Access Code: [email or web address] URL: [email or web address] Date: 03/31/2022 Prepared by: Gustavus Bryant   Exercises - Seated Sciatic Tensioner  - 3 x daily - 5 x weekly - 10 reps - Seated Transversus Abdominis Bracing  - 3 x daily - 5 x weekly - 1 sets - 10 reps - 3s hold - Standing [employer] at [employer]  - 3 x daily - 5 x weekly - 1 sets - 10 reps   ASSESSMENT:   CLINICAL IMPRESSION: Added time to aerobic work to build endurance.  Focus today remained on flexibility and hip and core strengthening.  Introduced patient to additional LE and trunk stretches.  Tightness noted in hamstring groups Bil.  TTP R gluteals and iliac crest, pain with ITB testing indicating soft tissue irritation.   OBJECTIVE IMPAIRMENTS: decreased activity tolerance, decreased endurance, decreased mobility, decreased ROM, decreased strength, improper body mechanics, postural dysfunction, and pain.    ACTIVITY LIMITATIONS: carrying, lifting, bending, standing, squatting, sleeping, and stairs   PARTICIPATION LIMITATIONS: meal prep, cleaning, and shopping   PERSONAL FACTORS: Age, Fitness, Past/current experiences, and Time since onset of injury/illness/exacerbation are also affecting patient's functional outcome.      GOALS: Goals reviewed with patient? Yes   SHORT TERM GOALS: Target date: 04/14/2022   Patient to demonstrate independence in HEP  Baseline: [id number] Goal  status: INITIAL   2.  Negative L slump test Baseline: positive L slump for hamstring symptoms Goal status: INITIAL     LONG TERM GOALS: Target date: 04/28/2022   Decrease 5x STS to 20s arms crossed Baseline: 26s arms crossed Goal status: INITIAL   2.  Increase FOTO score to 61 Baseline: 56 Goal status: INITIAL   3.  Increase core strength to 4/5 Baseline: 3+/5 Goal status: INITIAL   4.  20d PROM B hip IR Baseline: 10d B Goal status: INITIAL       PLAN:   PT FREQUENCY: 2x/week   PT DURATION:  6 weeks   PLANNED INTERVENTIONS: Therapeutic exercises, Therapeutic activity, Neuromuscular re-education, Balance training, Gait training, Patient/Family education, Self Care, Joint mobilization, Stair training, Aquatic Therapy, Dry Needling, Electrical stimulation, Manual therapy, and Re-evaluation.   PLAN FOR NEXT SESSION: HEP review and update, stretching and flexibility tasks, aerobic work, manual as appropriate, core and posture training   Hildred Laser, PT 04/20/2022, 9:56 AM

## 2022-04-20 ENCOUNTER — Ambulatory Visit: Payer: Medicare Other

## 2022-04-20 DIAGNOSIS — M48062 Spinal stenosis, lumbar region with neurogenic claudication: Secondary | ICD-10-CM

## 2022-04-20 DIAGNOSIS — M5459 Other low back pain: Secondary | ICD-10-CM | POA: Diagnosis not present

## 2022-04-20 DIAGNOSIS — R2689 Other abnormalities of gait and mobility: Secondary | ICD-10-CM

## 2022-04-21 NOTE — Therapy (Signed)
OUTPATIENT PHYSICAL THERAPY TREATMENT NOTE   Patient Name: Kelsey Conway MRN: [id number] DOB:1945-07-16, 77 y.o., female Today's Date: 04/25/2022  PCP: Vania Rea, FNP   REFERRING PROVIDER: Vania Rea, FNP    END OF SESSION:   PT End of Session - 04/25/22 0921     Visit Number 5    Number of Visits 8    Date for PT Re-Evaluation 05/26/22    Authorization Type [employer] MCR    PT Start Time 0915    PT Stop Time 0955    PT Time Calculation (min) 40 min    Activity Tolerance Patient tolerated treatment well    Behavior During Therapy WFL for tasks assessed/performed              Past Medical History:  Diagnosis Date   Arthritis    Chicken pox    Diabetes mellitus without complication    Family history of polyps in the colon    Frequent headaches    Glaucoma    Heart murmur    Hypertension    Past Surgical History:  Procedure Laterality Date   ABDOMINAL HYSTERECTOMY     CHOLECYSTECTOMY     FOOT SURGERY Left    TUBAL LIGATION     TUMOR REMOVAL  2017   Stomach tumor   Patient Active Problem List   Diagnosis Date Noted   Spinal stenosis of lumbar region 03/29/2022   Ataxia 07/07/2019   Constipation 02/25/2019   Class 2 obesity with body mass index (BMI) of 36.0 to 36.9 in adult 05/09/2018   Low back pain 10/04/2016   Chest pain 05/10/2016   Abnormal EKG 05/10/2016   Murmur 05/10/2016   Mixed hyperlipidemia 05/10/2016   Hypertension, essential, benign 05/03/2016   Insomnia 05/03/2016   Type 2 diabetes mellitus with other specified complication 05/03/2016   Generalized osteoarthritis of multiple sites 05/03/2016   Gastric mass 11/05/2014    REFERRING DIAG: M48.00 (ICD-10-CM) - Spinal stenosis, site unspecified   THERAPY DIAG:  Other low back pain  Spinal stenosis of lumbar region with neurogenic claudication  Other abnormalities of gait and mobility  Rationale for Evaluation and Treatment Rehabilitation  PERTINENT HISTORY:  Plan: Patient  is already scheduled for physical therapy.  She can follow-up with Dr. Willia Craze in 6 to 8 weeks to discuss decompression and fusion for her stenosis and anterolisthesis.  Pathophysiology discussed with patient.  Treatment options discussed.  She can follow-up with Dr. Christell Constant after therapy.    PRECAUTIONS: Other: stenosis and claudication   SUBJECTIVE:  SUBJECTIVE STATEMENT:  Was bothered by R hip pain over the weekend, causing knee to buckle at times.  No falls reported.   PAIN:  Are you having pain?  Yes: NPRS scale: 8/10 Pain location: low back Pain description: ache Aggravating factors: prolonged standing Relieving factors: rest and Doans pills   OBJECTIVE: (objective measures completed at initial evaluation unless otherwise dated)  DIAGNOSTIC FINDINGS:  MRI Spine  Lumbar WO IV Contrast   Anatomical Region Laterality Modality  T-spine -- Magnetic Resonance  L-spine -- --  Pelvis -- --    Impression   IMPRESSION:  1.  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  2.  Grade 1 anterolisthesis L3-4 and L4-5.  Electronically Signed by: Abner Greenspan, MD on 03/08/2022 12:45 PM   PATIENT SURVEYS:  FOTO 56(61 predicted)   SCREENING FOR RED FLAGS: Bowel or bladder incontinence: No       MUSCLE LENGTH: Hamstrings: Right 70 deg; Left 70 deg   POSTURE: rounded shoulders, forward head, and increased thoracic kyphosis   PALPATION: deferred   LUMBAR ROM:    AROM eval  Flexion    Extension    Right lateral flexion    Left lateral flexion    Right rotation 50%  Left rotation 75%   (Blank rows = not tested)     LOWER EXTREMITY MMT:  Active  Right eval Left eval  Hip flexion 4 4  Hip extension 4 4  Hip  abduction 4 4  Hip adduction      Hip internal rotation      Hip external rotation      Knee flexion 4 4  Knee extension 4 4  Ankle dorsiflexion      Ankle plantarflexion 4 4  Ankle inversion      Ankle eversion      Core/trunk 3+ 3+   (Blank rows = not tested)     LOWER EXTREMITY ROM:   MMT Right eval Left eval  Hip flexion      Hip extension      Hip abduction      Hip adduction      Hip internal rotation 10d 10d  Hip external rotation      Knee flexion      Knee extension      Ankle dorsiflexion      Ankle plantarflexion      Ankle inversion      Ankle eversion       (Blank rows = not tested)   LUMBAR SPECIAL TESTS:  Straight leg raise test: Negative, Slump test: Positive, and FABER test: Positive   FUNCTIONAL TESTS:  5 times sit to stand: 26s arms crossed   GAIT: Distance walked: 105ftx2 Assistive device utilized: None Level of assistance: Complete Independence Comments: slow cadence   TODAY'S TREATMENT:  [facility] Adult PT Treatment:                                                DATE: 04/25/22 Therapeutic Exercise: NuStep lvl 4 8 min Seated hamstring stretch 30s x2 Bil STS arms crossed with OH reach 5x2 Supine 90/90 30s x2 Bridge w/ball 15x Supine hip fallouts BluTB 15x Bil 15/15 unilaterally Supine QL stretch 30s x2 Bil L S/L clams 15x2 R only BluTB  [facility] Adult PT Treatment:  DATE: 04/20/22 Therapeutic Exercise: NuStep lvl 4 8 min Seated hamstring stretch 30s x2 Bil STS arms crossed with OH reach 5x2 Supine PPT x 10 - 3" hold Supine PPT with alt marching 10/10 (rest in between reps) PPT with alternation UE flexion 10/10(rest in between reps) Supine hip fallouts GTB 15x Bil 15/15 unilaterally Supine QL stretch 30s x2 Bil L S/L clams 15x2 R only  [facility] Adult PT Treatment:                                                DATE: 04/06/2022 Therapeutic Exercise: NuStep lvl 5 UE/LE x 5 min while taking  subjective Supine PPT x 10 - 3" hold Supine PPT with ball 2x10 Supine clamshell 2x15 GTB Supine SLR x 10 each - R difficult  Supine march 2x20 GTB Bridge 2x10 STS 2x10 - high table Standing hip adb/ext 2x10 each 2# Lateral walk 2# x 3 laps at counter  [facility] Adult PT Treatment:                                                DATE: 04/04/2022 Therapeutic Exercise: NuStep lvl 5 UE/LE x 5 min while taking subjective Supine PPT x 10 - 3" hold Supine ball squeeze  Supine clamshell 2x15 GTB Supine march 2x20 GTB Bridge 2x10 STS 2x10 - high table Standing hip adb/ext 2x10 each   PATIENT EDUCATION:  Education details: Discussed eval findings, rehab rationale and POC and patient is in agreement  Person educated: Patient and Spouse Education method: Explanation Education comprehension: verbalized understanding and needs further education   HOME EXERCISE PROGRAM: Access Code: [email or web address] URL: [email or web address] Date: 03/31/2022 Prepared by: Gustavus Bryant   Exercises - Seated Sciatic Tensioner  - 3 x daily - 5 x weekly - 10 reps - Seated Transversus Abdominis Bracing  - 3 x daily - 5 x weekly - 1 sets - 10 reps - 3s hold - Standing [employer] at [employer]  - 3 x daily - 5 x weekly - 1 sets - 10 reps   ASSESSMENT:   CLINICAL IMPRESSION: Continued with stretching tasks to hips and added additional resistance to strengthening tasks by advancing theraband resistance.  R hip symptoms appear to stem from hip joint vs spinal in origin.  Reinforced body mechanics when performing sit to stand transfers    OBJECTIVE IMPAIRMENTS: decreased activity tolerance, decreased endurance, decreased mobility, decreased ROM, decreased strength, improper body mechanics, postural dysfunction, and pain.    ACTIVITY LIMITATIONS: carrying, lifting, bending, standing, squatting, sleeping, and stairs   PARTICIPATION LIMITATIONS: meal prep, cleaning, and shopping   PERSONAL FACTORS: Age, Fitness,  Past/current experiences, and Time since onset of injury/illness/exacerbation are also affecting patient's functional outcome.      GOALS: Goals reviewed with patient? Yes   SHORT TERM GOALS: Target date: 04/14/2022   Patient to demonstrate independence in HEP  Baseline: [id number] Goal status: INITIAL   2.  Negative L slump test Baseline: positive L slump for hamstring symptoms Goal status: INITIAL     LONG TERM GOALS: Target date: 04/28/2022   Decrease 5x STS to 20s arms crossed Baseline: 26s arms crossed Goal status: INITIAL   2.  Increase FOTO score to 61 Baseline: 56 Goal  status: INITIAL   3.  Increase core strength to 4/5 Baseline: 3+/5 Goal status: INITIAL   4.  20d PROM B hip IR Baseline: 10d B Goal status: INITIAL       PLAN:   PT FREQUENCY: 2x/week   PT DURATION: 6 weeks   PLANNED INTERVENTIONS: Therapeutic exercises, Therapeutic activity, Neuromuscular re-education, Balance training, Gait training, Patient/Family education, Self Care, Joint mobilization, Stair training, Aquatic Therapy, Dry Needling, Electrical stimulation, Manual therapy, and Re-evaluation.   PLAN FOR NEXT SESSION: HEP review and update, stretching and flexibility tasks, aerobic work, manual as appropriate, core and posture training   Hildred Laser, PT 04/25/2022, 10:05 AM

## 2022-04-25 ENCOUNTER — Ambulatory Visit: Payer: Medicare Other | Attending: Family Medicine

## 2022-04-25 DIAGNOSIS — M48062 Spinal stenosis, lumbar region with neurogenic claudication: Secondary | ICD-10-CM | POA: Diagnosis present

## 2022-04-25 DIAGNOSIS — M5459 Other low back pain: Secondary | ICD-10-CM | POA: Insufficient documentation

## 2022-04-25 DIAGNOSIS — R2689 Other abnormalities of gait and mobility: Secondary | ICD-10-CM | POA: Insufficient documentation

## 2022-04-26 NOTE — Therapy (Unsigned)
OUTPATIENT PHYSICAL THERAPY TREATMENT NOTE   Patient Name: Kelsey Conway MRN: [id number] DOB:May 12, 1945, 77 y.o., female Today's Date: 04/27/2022  PCP: Vania Rea, FNP   REFERRING PROVIDER: Vania Rea, FNP    END OF SESSION:   PT End of Session - 04/27/22 0913     Visit Number 6    Number of Visits 8    Date for PT Re-Evaluation 05/26/22    Authorization Type [employer] MCR    PT Start Time 0915    PT Stop Time 0955    PT Time Calculation (min) 40 min    Activity Tolerance Patient tolerated treatment well    Behavior During Therapy WFL for tasks assessed/performed              Past Medical History:  Diagnosis Date   Arthritis    Chicken pox    Diabetes mellitus without complication    Family history of polyps in the colon    Frequent headaches    Glaucoma    Heart murmur    Hypertension    Past Surgical History:  Procedure Laterality Date   ABDOMINAL HYSTERECTOMY     CHOLECYSTECTOMY     FOOT SURGERY Left    TUBAL LIGATION     TUMOR REMOVAL  2017   Stomach tumor   Patient Active Problem List   Diagnosis Date Noted   Spinal stenosis of lumbar region 03/29/2022   Ataxia 07/07/2019   Constipation 02/25/2019   Class 2 obesity with body mass index (BMI) of 36.0 to 36.9 in adult 05/09/2018   Low back pain 10/04/2016   Chest pain 05/10/2016   Abnormal EKG 05/10/2016   Murmur 05/10/2016   Mixed hyperlipidemia 05/10/2016   Hypertension, essential, benign 05/03/2016   Insomnia 05/03/2016   Type 2 diabetes mellitus with other specified complication 05/03/2016   Generalized osteoarthritis of multiple sites 05/03/2016   Gastric mass 11/05/2014    REFERRING DIAG: M48.00 (ICD-10-CM) - Spinal stenosis, site unspecified   THERAPY DIAG:  Other low back pain  Spinal stenosis of lumbar region with neurogenic claudication  Other abnormalities of gait and mobility  Rationale for Evaluation and Treatment Rehabilitation  PERTINENT HISTORY:  Plan: Patient  is already scheduled for physical therapy.  She can follow-up with Dr. Willia Craze in 6 to 8 weeks to discuss decompression and fusion for her stenosis and anterolisthesis.  Pathophysiology discussed with patient.  Treatment options discussed.  She can follow-up with Dr. Christell Constant after therapy.    PRECAUTIONS: Other: stenosis and claudication   SUBJECTIVE:  SUBJECTIVE STATEMENT:  Rain increasing symptoms today, nothing out of the ordinary.  F/U with Dr. Christell Constant 05/11/22   PAIN:  Are you having pain?  Yes: NPRS scale: 8/10 Pain location: low back Pain description: ache Aggravating factors: prolonged standing Relieving factors: rest and Doans pills   OBJECTIVE: (objective measures completed at initial evaluation unless otherwise dated)  DIAGNOSTIC FINDINGS:  MRI Spine  Lumbar WO IV Contrast   Anatomical Region Laterality Modality  T-spine -- Magnetic Resonance  L-spine -- --  Pelvis -- --    Impression   IMPRESSION:  1.  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  2.  Grade 1 anterolisthesis L3-4 and L4-5.  Electronically Signed by: Abner Greenspan, MD on 03/08/2022 12:45 PM   PATIENT SURVEYS:  FOTO 56(61 predicted)   SCREENING FOR RED FLAGS: Bowel or bladder incontinence: No       MUSCLE LENGTH: Hamstrings: Right 70 deg; Left 70 deg   POSTURE: rounded shoulders, forward head, and increased thoracic kyphosis   PALPATION: deferred   LUMBAR ROM:    AROM eval  Flexion    Extension    Right lateral flexion    Left lateral flexion    Right rotation 50%  Left rotation 75%   (Blank rows = not tested)     LOWER EXTREMITY MMT:  Active  Right eval Left eval  Hip flexion 4 4  Hip extension 4 4  Hip  abduction 4 4  Hip adduction      Hip internal rotation      Hip external rotation      Knee flexion 4 4  Knee extension 4 4  Ankle dorsiflexion      Ankle plantarflexion 4 4  Ankle inversion      Ankle eversion      Core/trunk 3+ 3+   (Blank rows = not tested)     LOWER EXTREMITY ROM:   MMT Right eval Left eval  Hip flexion      Hip extension      Hip abduction      Hip adduction      Hip internal rotation 10d 10d  Hip external rotation      Knee flexion      Knee extension      Ankle dorsiflexion      Ankle plantarflexion      Ankle inversion      Ankle eversion       (Blank rows = not tested)   LUMBAR SPECIAL TESTS:  Straight leg raise test: Negative, Slump test: Positive, and FABER test: Positive 04/27/22 Slump test (-) B   FUNCTIONAL TESTS:  5 times sit to stand: 26s arms crossed   GAIT: Distance walked: 25ftx2 Assistive device utilized: None Level of assistance: Complete Independence Comments: slow cadence   TODAY'S TREATMENT:  [facility] Adult PT Treatment:                                                DATE: 04/27/22 Therapeutic Exercise: NuStep lvl 4 8 min Seated hamstring stretch 30s x2 Bil STS  Supine 90/90 30s x2 Bridge w/ball 15x Bridge against BluTB 15x Supine hip fallouts BluTB 15x Bil 15/15 unilaterally Supine QL stretch 30s x2 Bil L S/L clams 15x B BluTB B hip flexor stretch 30s x2 B  over bolster   [facility] Adult PT Treatment:                                                DATE: 04/25/22 Therapeutic Exercise: NuStep lvl 4 8 min Seated hamstring stretch 30s x2 Bil STS arms crossed with OH reach 5x2 Supine 90/90 30s x2 Bridge w/ball 15x Supine hip fallouts BluTB 15x Bil 15/15 unilaterally Supine QL stretch 30s x2 Bil L S/L clams 15x2 R only BluTB  [facility] Adult PT Treatment:                                                DATE: 04/20/22 Therapeutic Exercise: NuStep lvl 4 8 min Seated hamstring stretch 30s x2 Bil STS arms crossed with OH reach  5x2 Supine PPT x 10 - 3" hold Supine PPT with alt marching 10/10 (rest in between reps) PPT with alternation UE flexion 10/10(rest in between reps) Supine hip fallouts GTB 15x Bil 15/15 unilaterally Supine QL stretch 30s x2 Bil L S/L clams 15x2 R only  [facility] Adult PT Treatment:                                                DATE: 04/06/2022 Therapeutic Exercise: NuStep lvl 5 UE/LE x 5 min while taking subjective Supine PPT x 10 - 3" hold Supine PPT with ball 2x10 Supine clamshell 2x15 GTB Supine SLR x 10 each - R difficult  Supine march 2x20 GTB Bridge 2x10 STS 2x10 - high table Standing hip adb/ext 2x10 each 2# Lateral walk 2# x 3 laps at counter  [facility] Adult PT Treatment:                                                DATE: 04/04/2022 Therapeutic Exercise: NuStep lvl 5 UE/LE x 5 min while taking subjective Supine PPT x 10 - 3" hold Supine ball squeeze  Supine clamshell 2x15 GTB Supine march 2x20 GTB Bridge 2x10 STS 2x10 - high table Standing hip adb/ext 2x10 each   PATIENT EDUCATION:  Education details: Discussed eval findings, rehab rationale and POC and patient is in agreement  Person educated: Patient and Spouse Education method: Explanation Education comprehension: verbalized understanding and needs further education   HOME EXERCISE PROGRAM: Access Code: [email or web address] URL: [email or web address] Date: 03/31/2022 Prepared by: Gustavus Bryant   Exercises - Seated Sciatic Tensioner  - 3 x daily - 5 x weekly - 10 reps - Seated Transversus Abdominis Bracing  - 3 x daily - 5 x weekly - 1 sets - 10 reps - 3s hold - Standing [employer] at [employer]  - 3 x daily - 5 x weekly - 1 sets - 10 reps   ASSESSMENT:   CLINICAL IMPRESSION:STGs addressed and met today.  Treatment options limited due to dx of canal stenosis as well as spondylolisthesis and need to avoid excessive flexion and extension tasks. L hip IR ROM increased but R unchanged.  Added B clamshells  and  bridge against T-band.  Introduced hip flexor stretching   OBJECTIVE IMPAIRMENTS: decreased activity tolerance, decreased endurance, decreased mobility, decreased ROM, decreased strength, improper body mechanics, postural dysfunction, and pain.    ACTIVITY LIMITATIONS: carrying, lifting, bending, standing, squatting, sleeping, and stairs   PARTICIPATION LIMITATIONS: meal prep, cleaning, and shopping   PERSONAL FACTORS: Age, Fitness, Past/current experiences, and Time since onset of injury/illness/exacerbation are also affecting patient's functional outcome.      GOALS: Goals reviewed with patient? Yes   SHORT TERM GOALS: Target date: 04/14/2022   Patient to demonstrate independence in HEP  Baseline: [id number] Goal status: Met   2.  Negative L slump test Baseline: positive L slump for hamstring symptoms; 04/27/22 negative B Goal status: Met     LONG TERM GOALS: Target date: 04/28/2022   Decrease 5x STS to 20s arms crossed Baseline: 26s arms crossed Goal status: INITIAL   2.  Increase FOTO score to 61 Baseline: 56 Goal status: INITIAL   3.  Increase core strength to 4/5 Baseline: 3+/5 Goal status: INITIAL   4.  20d PROM B hip IR Baseline: 10d B; 04/27/22 30d L, 10d R Goal status: Partially met       PLAN:   PT FREQUENCY: 2x/week   PT DURATION: 6 weeks   PLANNED INTERVENTIONS: Therapeutic exercises, Therapeutic activity, Neuromuscular re-education, Balance training, Gait training, Patient/Family education, Self Care, Joint mobilization, Stair training, Aquatic Therapy, Dry Needling, Electrical stimulation, Manual therapy, and Re-evaluation.   PLAN FOR NEXT SESSION: HEP review and update, stretching and flexibility tasks, aerobic work, manual as appropriate, core and posture training   Hildred Laser, PT 04/27/2022, 9:55 AM

## 2022-04-27 ENCOUNTER — Ambulatory Visit: Payer: Medicare Other

## 2022-04-27 DIAGNOSIS — M5459 Other low back pain: Secondary | ICD-10-CM

## 2022-04-27 DIAGNOSIS — R2689 Other abnormalities of gait and mobility: Secondary | ICD-10-CM

## 2022-04-27 DIAGNOSIS — M48062 Spinal stenosis, lumbar region with neurogenic claudication: Secondary | ICD-10-CM

## 2022-05-03 NOTE — Therapy (Addendum)
OUTPATIENT PHYSICAL THERAPY TREATMENT NOTE/DISCHARGE  PHYSICAL THERAPY DISCHARGE SUMMARY  Visits from Start of Care: 7  Current functional level related to goals / functional outcomes: See goals/objective   Remaining deficits: Unable to assess   Education / Equipment: HEP   Patient agrees to discharge. Patient goals were unable to assess. Patient is being discharged due to not returning since the last visit.     Patient Name: Kelsey Conway MRN: [id number] DOB:1945/02/22, 77 y.o., female Today's Date: 05/04/2022  PCP: Vania Rea, FNP   REFERRING PROVIDER: Vania Rea, FNP    END OF SESSION:   PT End of Session - 05/04/22 0913     Visit Number 7    Number of Visits 8    Date for PT Re-Evaluation 05/26/22    Authorization Type [employer] MCR    PT Start Time 0913    PT Stop Time 0952    PT Time Calculation (min) 39 min    Activity Tolerance Patient tolerated treatment well    Behavior During Therapy WFL for tasks assessed/performed               Past Medical History:  Diagnosis Date   Arthritis    Chicken pox    Diabetes mellitus without complication    Family history of polyps in the colon    Frequent headaches    Glaucoma    Heart murmur    Hypertension    Past Surgical History:  Procedure Laterality Date   ABDOMINAL HYSTERECTOMY     CHOLECYSTECTOMY     FOOT SURGERY Left    TUBAL LIGATION     TUMOR REMOVAL  2017   Stomach tumor   Patient Active Problem List   Diagnosis Date Noted   Spinal stenosis of lumbar region 03/29/2022   Ataxia 07/07/2019   Constipation 02/25/2019   Class 2 obesity with body mass index (BMI) of 36.0 to 36.9 in adult 05/09/2018   Low back pain 10/04/2016   Chest pain 05/10/2016   Abnormal EKG 05/10/2016   Murmur 05/10/2016   Mixed hyperlipidemia 05/10/2016   Hypertension, essential, benign 05/03/2016   Insomnia 05/03/2016   Type 2 diabetes mellitus with other specified complication 05/03/2016   Generalized  osteoarthritis of multiple sites 05/03/2016   Gastric mass 11/05/2014    REFERRING DIAG: M48.00 (ICD-10-CM) - Spinal stenosis, site unspecified   THERAPY DIAG:  Other low back pain  Spinal stenosis of lumbar region with neurogenic claudication  Other abnormalities of gait and mobility  Rationale for Evaluation and Treatment Rehabilitation  PERTINENT HISTORY:  Plan: Patient is already scheduled for physical therapy.  She can follow-up with Dr. Willia Craze in 6 to 8 weeks to discuss decompression and fusion for her stenosis and anterolisthesis.  Pathophysiology discussed with patient.  Treatment options discussed.  She can follow-up with Dr. Christell Constant after therapy.    PRECAUTIONS: Other: stenosis and claudication   SUBJECTIVE:  SUBJECTIVE STATEMENT:  Pt presents to PT with reports of R hip pain, notes back is feeling okay. Has been compliant with HEP.    PAIN:  Are you having pain?  Yes: NPRS scale: 10/10 Pain location: R hip, R lateral LE Pain description: ache Aggravating factors: prolonged standing Relieving factors: rest and Doans pills   OBJECTIVE: (objective measures completed at initial evaluation unless otherwise dated)  DIAGNOSTIC FINDINGS:  MRI Spine  Lumbar WO IV Contrast   Anatomical Region Laterality Modality  T-spine -- Magnetic Resonance  L-spine -- --  Pelvis -- --    Impression   IMPRESSION:  1.  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  2.  Grade 1 anterolisthesis L3-4 and L4-5.  Electronically Signed by: Abner Greenspan, MD on 03/08/2022 12:45 PM   PATIENT SURVEYS:  FOTO 56(61 predicted)  05/04/2022: 45% function   SCREENING FOR RED FLAGS: Bowel or bladder incontinence: No    MUSCLE  LENGTH: Hamstrings: Right 70 deg; Left 70 deg   POSTURE: rounded shoulders, forward head, and increased thoracic kyphosis   PALPATION: deferred   LUMBAR ROM:    AROM eval  Flexion    Extension    Right lateral flexion    Left lateral flexion    Right rotation 50%  Left rotation 75%   (Blank rows = not tested)     LOWER EXTREMITY MMT:  Active  Right eval Left eval  Hip flexion 4 4  Hip extension 4 4  Hip abduction 4 4  Hip adduction      Hip internal rotation      Hip external rotation      Knee flexion 4 4  Knee extension 4 4  Ankle dorsiflexion      Ankle plantarflexion 4 4  Ankle inversion      Ankle eversion      Core/trunk 3+ 3+   (Blank rows = not tested)     LOWER EXTREMITY ROM:   MMT Right eval Left eval  Hip flexion      Hip extension      Hip abduction      Hip adduction      Hip internal rotation 10d 10d  Hip external rotation      Knee flexion      Knee extension      Ankle dorsiflexion      Ankle plantarflexion      Ankle inversion      Ankle eversion       (Blank rows = not tested)   LUMBAR SPECIAL TESTS:  Straight leg raise test: Negative, Slump test: Positive, and FABER test: Positive 04/27/22 Slump test (-) B   FUNCTIONAL TESTS:  5 times sit to stand: 26s arms crossed   GAIT: Distance walked: 41ftx2 Assistive device utilized: None Level of assistance: Complete Independence Comments: slow cadence   TODAY'S TREATMENT:  [facility] Adult PT Treatment:                                                DATE: 05/04/22 Therapeutic Exercise: NuStep lvl 5 x 5 min while taking subjective Seated hamstring stretch 30s each LTR x 10 each Supine clamshell 2x10 BTB Supine 90/90 ball DKTC 2x10 Bridge 2x10 Supine SLR x 10 each Seated physioball rollout 2x15  [facility] Adult  PT Treatment:                                                DATE: 04/27/22 Therapeutic Exercise: NuStep lvl 4 8 min Seated hamstring stretch 30s x2 Bil STS  Supine 90/90 30s  x2 Bridge w/ball 15x Bridge against BluTB 15x Supine hip fallouts BluTB 15x Bil 15/15 unilaterally Supine QL stretch 30s x2 Bil L S/L clams 15x B BluTB B hip flexor stretch 30s x2 B over bolster   [facility] Adult PT Treatment:                                                DATE: 04/25/22 Therapeutic Exercise: NuStep lvl 4 8 min Seated hamstring stretch 30s x2 Bil STS arms crossed with OH reach 5x2 Supine 90/90 30s x2 Bridge w/ball 15x Supine hip fallouts BluTB 15x Bil 15/15 unilaterally Supine QL stretch 30s x2 Bil L S/L clams 15x2 R only BluTB  [facility] Adult PT Treatment:                                                DATE: 04/20/22 Therapeutic Exercise: NuStep lvl 4 8 min Seated hamstring stretch 30s x2 Bil STS arms crossed with OH reach 5x2 Supine PPT x 10 - 3" hold Supine PPT with alt marching 10/10 (rest in between reps) PPT with alternation UE flexion 10/10(rest in between reps) Supine hip fallouts GTB 15x Bil 15/15 unilaterally Supine QL stretch 30s x2 Bil L S/L clams 15x2 R only  [facility] Adult PT Treatment:                                                DATE: 04/06/2022 Therapeutic Exercise: NuStep lvl 5 UE/LE x 5 min while taking subjective Supine PPT x 10 - 3" hold Supine PPT with ball 2x10 Supine clamshell 2x15 GTB Supine SLR x 10 each - R difficult  Supine march 2x20 GTB Bridge 2x10 STS 2x10 - high table Standing hip adb/ext 2x10 each 2# Lateral walk 2# x 3 laps at counter  [facility] Adult PT Treatment:                                                DATE: 04/04/2022 Therapeutic Exercise: NuStep lvl 5 UE/LE x 5 min while taking subjective Supine PPT x 10 - 3" hold Supine ball squeeze  Supine clamshell 2x15 GTB Supine march 2x20 GTB Bridge 2x10 STS 2x10 - high table Standing hip adb/ext 2x10 each   PATIENT EDUCATION:  Education details: Discussed eval findings, rehab rationale and POC and patient is in agreement  Person educated: Patient and Spouse Education method:  Explanation Education comprehension: verbalized understanding and needs further education   HOME EXERCISE PROGRAM: Access Code: [email or web address] URL: [email or web address] Date: 03/31/2022 Prepared by: Gustavus Bryant   Exercises -  Seated Sciatic Tensioner  - 3 x daily - 5 x weekly - 10 reps - Seated Transversus Abdominis Bracing  - 3 x daily - 5 x weekly - 1 sets - 10 reps - 3s hold - Standing [employer] at [employer]  - 3 x daily - 5 x weekly - 1 sets - 10 reps   ASSESSMENT:   CLINICAL IMPRESSION: Pt was able to complete prescribed exercises but was limited by R LE pain. Therapy today continued to focus on light core and proximal hip strengthening and improving functional mobility in order to decrease pain. Over course of PT treatment she has not had much change in symptoms or status. Her FOTO score decreased today and she has the same 5xSTS time compared to initial evaluation. She has f/u with Dr. Christell Constant on 4/17, will continue with POC unless directed otherwise.    OBJECTIVE IMPAIRMENTS: decreased activity tolerance, decreased endurance, decreased mobility, decreased ROM, decreased strength, improper body mechanics, postural dysfunction, and pain.    ACTIVITY LIMITATIONS: carrying, lifting, bending, standing, squatting, sleeping, and stairs   PARTICIPATION LIMITATIONS: meal prep, cleaning, and shopping   PERSONAL FACTORS: Age, Fitness, Past/current experiences, and Time since onset of injury/illness/exacerbation are also affecting patient's functional outcome.      GOALS: Goals reviewed with patient? Yes   SHORT TERM GOALS: Target date: 04/14/2022   Patient to demonstrate independence in HEP  Baseline: [email or web address] Goal status: Met   2.  Negative L slump test Baseline: positive L slump for hamstring symptoms; 04/27/22 negative B Goal status: Met     LONG TERM GOALS: Target date: 04/28/2022   Decrease 5x STS to 20s arms crossed Baseline: 26s arms crossed 05/04/22: 26  sec Goal status: ONGOING   2.  Increase FOTO score to 61 Baseline: 56 05/04/22: 45% function Goal status: ONGOING   3.  Increase core strength to 4/5 Baseline: 3+/5 Goal status: ONGOING   4.  20d PROM B hip IR Baseline: 10d B; 04/27/22 30d L, 10d R Goal status: Partially met       PLAN:   PT FREQUENCY: 2x/week   PT DURATION: 6 weeks   PLANNED INTERVENTIONS: Therapeutic exercises, Therapeutic activity, Neuromuscular re-education, Balance training, Gait training, Patient/Family education, Self Care, Joint mobilization, Stair training, Aquatic Therapy, Dry Needling, Electrical stimulation, Manual therapy, and Re-evaluation.   PLAN FOR NEXT SESSION: HEP review and update, stretching and flexibility tasks, aerobic work, manual as appropriate, core and posture training   Eloy End, PT 05/04/2022, 9:54 AM

## 2022-05-04 ENCOUNTER — Ambulatory Visit: Payer: Medicare Other

## 2022-05-04 DIAGNOSIS — R2689 Other abnormalities of gait and mobility: Secondary | ICD-10-CM

## 2022-05-04 DIAGNOSIS — M48062 Spinal stenosis, lumbar region with neurogenic claudication: Secondary | ICD-10-CM

## 2022-05-04 DIAGNOSIS — M5459 Other low back pain: Secondary | ICD-10-CM | POA: Diagnosis not present

## 2022-05-11 ENCOUNTER — Encounter: Payer: Self-pay | Admitting: Orthopedic Surgery

## 2022-05-11 ENCOUNTER — Other Ambulatory Visit (INDEPENDENT_AMBULATORY_CARE_PROVIDER_SITE_OTHER): Payer: Medicare Other

## 2022-05-11 ENCOUNTER — Ambulatory Visit: Payer: Medicare Other | Admitting: Orthopedic Surgery

## 2022-05-11 VITALS — BP 154/84 | HR 67 | Ht 64.0 in | Wt 163.0 lb

## 2022-05-11 DIAGNOSIS — M810 Age-related osteoporosis without current pathological fracture: Secondary | ICD-10-CM | POA: Diagnosis not present

## 2022-05-11 DIAGNOSIS — M25551 Pain in right hip: Secondary | ICD-10-CM

## 2022-05-11 NOTE — Progress Notes (Addendum)
Orthopedic Spine Surgery Office Note  Assessment: Patient is a 77 y.o. female with low back, right hip, and right lateral leg pain.  Has symptoms consistent with neurogenic claudication but also physical exam findings consistent with hip etiology.  Has OA in the right hip and also lumbar stenosis   Plan: -Patient has tried PT, Tylenol, intramuscular and lumbar steroid injections -Patient is a female over 50 so she is due for a bone density screening, DEXA scan ordered today -Discussed the fact that she has symptoms and findings consistent with both lumbar and hip pathology.  Recommended diagnostic and therapeutic injection with Dr. Shon Baton to the right hip.  If she gets significant relief with this, then she may benefit from a hip arthroplasty I will refer her to one of my partners that does arthroplasty.  If she does not get good relief, then we would have to decide whether her back or hip is generating more of her symptoms -Will need to repeat A1c prior to any elective surgical intervention -Patient should return to office in 4 weeks, x-rays at next visit: None   Patient expressed understanding of the plan and all questions were answered to the patient's satisfaction.   ___________________________________________________________________________   History:  Patient is a 77 y.o. female who presents today for lumbar spine.  Patient has had over a year of low back pain that radiates into her right hip and down the lateral aspect of the right leg.  There is no trauma or injury that preceded the onset of pain.  Pain has been getting progressively worse with time.  She feels pain in the groin, over the lateral aspect of the right thigh, and into the right lateral aspect of the leg.  She has bilateral buttock pain as well.  Pain is worse with walking and gets better if she sits down.  She also noticed that it helps if she leans over a shopping cart.  Right now, the majority of her pain is in the lateral  aspect of the hip and in the groin.  Had gotten very temporary relief with prior lumbar steroid injection.  She said it lasted a day or two.  Sometimes gets decree sensation to the right leg.  No other numbness or paresthesias.   Weakness: Yes, bilateral legs feel weaker at times Symptoms of imbalance: Denies Paresthesias and numbness: Denies Bowel or bladder incontinence: Denies Saddle anesthesia: Denies  Treatments tried: Activity modification, PT, Tylenol, intramuscular and lumbar steroid injections  Review of systems: Denies fevers and chills, night sweats, unexplained weight loss, history of cancer.  Has had pain that wakes her at night  Past medical history: Hypertension CKD Diabetes Osteoarthritis Chronic pain Glaucoma  Allergies: Latex  Past surgical history:  Hysterectomy Cholecystectomy Tubal ligation Stomach surgery Left foot surgery  Social history: Denies use of nicotine product (smoking, vaping, patches, smokeless) Alcohol use: Denies Denies recreational drug use   Physical Exam:  BMI of 27.9  General: no acute distress, appears stated age Neurologic: alert, answering questions appropriately, following commands Respiratory: unlabored breathing on room air, symmetric chest rise Psychiatric: appropriate affect, normal cadence to speech   MSK (spine):  -Strength exam      Left  Right EHL    5/5  5/5 TA    5/5  5/5 GSC    5/5  5/5 Knee extension  5/5  5/5 Hip flexion   5/5  5/5  -Sensory exam    Sensation intact to light touch in L3-S1 nerve distributions  of bilateral lower extremities  -Achilles DTR: 2/4 on the left, 2/4 on the right -Patellar tendon DTR: 2/4 on the left, 2/4 on the right  -Straight leg raise: Negative bilaterally -Femoral nerve stretch test: Negative bilaterally -Clonus: no beats bilaterally  -Left hip exam: No pain through range of motion, negative Stinchfield, negative FABER -Right hip exam: Decreased internal and  external rotation, pain with internal rotation past 5 degrees, pain with external rotation past 40 degrees, positive Stinchfield, negative FABER  Imaging: XR of the lumbar spine from 03/29/2022 was independently reviewed and interpreted, showing spondylolisthesis at L3/4 that shifts about 1.8 mm between flexion-extension views.  Spondylolisthesis at L4/5 that shifts about 2mm between flexion and extension views.  Disc height loss at L4/5.  No fracture or dislocation seen.  MRI of the lumbar spine (on CD) from 03/07/2022 was independently reviewed and interpreted, showing central lateral recess stenosis at L3/4 with bilateral foraminal stenosis (R>L).  Lateral recess stenosis at L4/5 with an associated spondylolisthesis.  No other significant stenosis seen.  XR of the right hip from 05/11/2022 was independently reviewed and interpreted, showing bilateral joint space narrowing in the hips, more significant on the right side.  Subchondral sclerosis and osteophyte formation seen on the right hip.  No fracture or dislocation seen.   Patient name: Kelsey Conway Patient MRN: [id number] Date of visit: 05/11/22

## 2022-05-12 ENCOUNTER — Ambulatory Visit: Payer: Medicare Other

## 2022-05-16 ENCOUNTER — Ambulatory Visit: Payer: Medicare Other | Admitting: Sports Medicine

## 2022-05-17 ENCOUNTER — Encounter: Payer: Self-pay | Admitting: Sports Medicine

## 2022-05-17 ENCOUNTER — Other Ambulatory Visit: Payer: Self-pay

## 2022-05-17 ENCOUNTER — Ambulatory Visit: Payer: Medicare Other | Admitting: Sports Medicine

## 2022-05-17 DIAGNOSIS — M25551 Pain in right hip: Secondary | ICD-10-CM | POA: Diagnosis not present

## 2022-05-17 DIAGNOSIS — M1611 Unilateral primary osteoarthritis, right hip: Secondary | ICD-10-CM | POA: Diagnosis not present

## 2022-05-17 MED ORDER — METHYLPREDNISOLONE ACETATE 40 MG/ML IJ SUSP
80.0000 mg | INTRAMUSCULAR | Status: AC | PRN
Start: 2022-05-17 — End: 2022-05-17
  Administered 2022-05-17: 80 mg via INTRA_ARTICULAR

## 2022-05-17 MED ORDER — LIDOCAINE HCL 1 % IJ SOLN
4.0000 mL | INTRAMUSCULAR | Status: AC | PRN
Start: 2022-05-17 — End: 2022-05-17
  Administered 2022-05-17: 4 mL

## 2022-05-17 NOTE — Progress Notes (Signed)
Procedure Note  Patient: Kelsey Conway             Date of Birth: 1945/01/28           MRN: [id number]             Visit Date: 05/17/2022  Procedures: Visit Diagnoses:  1. Pain in right hip   2. Unilateral primary osteoarthritis, right hip    Large Joint Inj: R hip joint on 05/17/2022 10:19 AM Indications: pain Details: 22 G 3.5 in needle, ultrasound-guided anterior approach Medications: 4 mL lidocaine 1 %; 80 mg methylPREDNISolone acetate 40 MG/ML Outcome: tolerated well, no immediate complications  Procedure: US-guided intra-articular hip injection, right After discussion on risks/benefits/indications and informed verbal consent was obtained, a timeout was performed. Patient was lying supine on exam table. The hip was cleaned with betadine and alcohol swabs. Then utilizing ultrasound guidance, the patient's femoral head and neck junction was identified and subsequently injected with 4:2 lidocaine:depomedrol via an in-plane approach with ultrasound visualization of the injectate administered into the hip joint. Patient tolerated procedure well without immediate complications.  Procedure, treatment alternatives, risks and benefits explained, specific risks discussed. Consent was given by the patient. Immediately prior to procedure a time out was called to verify the correct patient, procedure, equipment, support staff and site/side marked as required. Patient was prepped and draped in the usual sterile fashion.    - I evaluated the patient about 10 minutes post-injection and she had improvement in pain and range of motion - follow-up with Dr. Christell Constant as indicated; I am happy to see them as needed  Madelyn Brunner, DO Primary Care Sports Medicine Physician  [facility] - Orthopedics  This note was dictated using Dragon naturally speaking software and may contain errors in syntax, spelling, or content which have not been identified prior to signing this note.

## 2022-05-20 ENCOUNTER — Ambulatory Visit: Payer: Medicare Other

## 2022-06-08 ENCOUNTER — Ambulatory Visit: Payer: Medicare Other | Admitting: Orthopedic Surgery

## 2022-06-08 DIAGNOSIS — M1611 Unilateral primary osteoarthritis, right hip: Secondary | ICD-10-CM

## 2022-06-08 NOTE — Progress Notes (Signed)
Orthopedic Spine Surgery Office Note   Assessment: Patient is a 77 y.o. female with low back, right hip, and right lateral leg pain.  Has symptoms consistent with neurogenic claudication but also physical exam findings consistent with hip osteoarthritis.  Has OA in the right hip and also lumbar stenosis     Plan: -Patient has tried PT, Tylenol, intramuscular and lumbar steroid injections, right hip injection -DEXA scan has been previously ordered -Explained that she has symptoms of both lumbar stenosis and hip osteoarthritis.  She feels more symptomatic in her hip and did get 50% relief with an intra-articular injection.  After discussing her symptoms, she feels more of her pain is coming from the hip.  She has tried conservative treatment so I will refer her to one of my arthroplasty colleagues for consideration of THA -Patient should return to office in 12 weeks, x-rays at next visit: Lumbar AP/lateral/flex/ex     Patient expressed understanding of the plan and all questions were answered to the patient's satisfaction.    ___________________________________________________________________________     History:   Patient is a 77 y.o. female who was previously seen in the office for her lumbar spine.  She has been having low back pain that radiates into her right lateral hip and down the lateral aspect of the right leg.  She also describes significant groin pain.  She feels the pain when she is walking and does not have the pain when she is sitting down or laying down.  She also has bilateral buttock pain that she feels when she is ambulating improves with flexion of the lumbar spine or sitting down.  She does feel that her pain is better when she leans over a shopping cart.  She states that the majority of her pain that was over the lateral aspect of the hip and in the groin.  After our last visit, she got a diagnostic/therapeutic injection to the right hip.  She says she got 50% relief with that  injection but it only lasted about a week.  She is not having any new pain since last time I saw her.  Denies paresthesias and numbness.     Treatments tried: Activity modification, PT, Tylenol, intramuscular and lumbar steroid injections, right hip injection    Physical Exam:   General: no acute distress, appears stated age Neurologic: alert, answering questions appropriately, following commands Respiratory: unlabored breathing on room air, symmetric chest rise Psychiatric: appropriate affect, normal cadence to speech     MSK (spine):   -Strength exam                                                   Left                  Right EHL                              5/5                  5/5 TA                                 5/5  5/5 GSC                             5/5                  5/5 Knee extension            5/5                  5/5 Hip flexion                    5/5                  5/5   -Sensory exam                           Sensation intact to light touch in L3-S1 nerve distributions of bilateral lower extremities   -Straight leg raise: Negative bilaterally -Femoral nerve stretch test: Negative bilaterally -Clonus: no beats bilaterally   -Left hip exam: No pain through range of motion, negative Stinchfield, negative FABER -Right hip exam: Decreased internal and external rotation, pain with internal rotation past 5 degrees, pain with external rotation past 40 degrees, positive Stinchfield, negative FABER   Imaging: XR of the lumbar spine from 03/29/2022 was previously independently reviewed and interpreted, showing spondylolisthesis at L3/4 that shifts about 1.8 mm between flexion-extension views.  Spondylolisthesis at L4/5 that shifts about 2mm between flexion and extension views.  Disc height loss at L4/5.  No fracture or dislocation seen.   MRI of the lumbar spine (on CD) from 03/07/2022 was previously independently reviewed and interpreted, showing  central lateral recess stenosis at L3/4 with bilateral foraminal stenosis (R>L).  Lateral recess stenosis at L4/5 with an associated spondylolisthesis.  No other significant stenosis seen.   XR of the right hip from 05/11/2022 was previously independently reviewed and interpreted, showing bilateral joint space narrowing in the hips, more significant on the right side.  Subchondral sclerosis and osteophyte formation seen on the right hip.  No fracture or dislocation seen.     Patient name: Kelsey Conway Patient MRN: [id number] Date of visit: 06/08/22

## 2022-06-14 ENCOUNTER — Other Ambulatory Visit: Payer: Self-pay

## 2022-06-14 ENCOUNTER — Other Ambulatory Visit (INDEPENDENT_AMBULATORY_CARE_PROVIDER_SITE_OTHER): Payer: Medicare Other

## 2022-06-14 ENCOUNTER — Ambulatory Visit: Payer: Medicare Other | Admitting: Orthopaedic Surgery

## 2022-06-14 DIAGNOSIS — M1611 Unilateral primary osteoarthritis, right hip: Secondary | ICD-10-CM

## 2022-06-14 NOTE — Progress Notes (Signed)
Office Visit Note   Patient: Kelsey Conway           Date of Birth: Dec 26, 1945           MRN: [id number] Visit Date: 06/14/2022              Requested by: Kelsey Sheer, MD [address] [place],  [place] [address] PCP: Kelsey Rea, FNP   Assessment & Plan: Visit Diagnoses:  1. Primary osteoarthritis of right hip     Plan: Impression is severe right hip degenerative joint disease secondary to Osteoarthritis.  Imaging shows bone on bone joint space narrowing.  At this point, conservative treatments fail to provide any significant relief and the pain is severely affecting ADLs and quality of life.  Based on treatment options, the patient has elected to move forward with a hip replacement.  We have discussed the surgical risks that include but are not limited to infection, DVT, leg length discrepancy, numbness, tingling, incomplete relief of pain.  Recovery and prognosis were also reviewed.  Kelsey Conway will call the patient to confirm surgery time once we have the necessary clearances.  Current anticoagulants: No antithrombotic Postop anticoagulation: Aspirin 81 mg Diabetic: Yes  will recheck A1c today Prior DVT/PE: No Tobacco use: No Clearances needed for surgery: Kelsey Conway - PCP Anticipate discharge dispo: home   Follow-Up Instructions: No follow-ups on file.   Orders:  Orders Placed This Encounter  Procedures   XR Pelvis 1-2 Views   No orders of the defined types were placed in this encounter.     Procedures: No procedures performed   Clinical Data: No additional findings.   Subjective: Chief Complaint  Patient presents with   Right Hip - Pain    HPI Kelsey Conway is a very pleasant 77 year old female referral from Dr. Christell Constant for surgical consultation for right total hip replacement.  Prior to seeing me she had tried physician prescribed physical therapy as well as a steroid shot which she had on 05/17/2022.  Unfortunately just shot only lasted 1  week. Review of Systems  Constitutional: Negative.   HENT: Negative.    Eyes: Negative.   Respiratory: Negative.    Cardiovascular: Negative.   Endocrine: Negative.   Musculoskeletal: Negative.   Neurological: Negative.   Hematological: Negative.   Psychiatric/Behavioral: Negative.    All other systems reviewed and are negative.    Objective: Vital Signs: There were no vitals taken for this visit.  Physical Exam Vitals and nursing note reviewed.  Constitutional:      Appearance: She is well-developed.  HENT:     Head: Normocephalic and atraumatic.  Pulmonary:     Effort: Pulmonary effort is normal.  Abdominal:     Palpations: Abdomen is soft.  Musculoskeletal:     Cervical back: Neck supple.  Skin:    General: Skin is warm.     Capillary Refill: Capillary refill takes less than 2 seconds.  Neurological:     Mental Status: She is alert and oriented to person, place, and time.  Psychiatric:        Behavior: Behavior normal.        Thought Content: Thought content normal.        Judgment: Judgment normal.     Ortho Exam Examination right hip shows severe pain in the groin and hip with movement of the hip joint. Specialty Comments:  No specialty comments available.  Imaging: No results found.   PMFS History: Patient Active Problem List  Diagnosis Date Noted   Spinal stenosis of lumbar region 03/29/2022   Ataxia 07/07/2019   Constipation 02/25/2019   Class 2 obesity with body mass index (BMI) of 36.0 to 36.9 in adult 05/09/2018   Low back pain 10/04/2016   Chest pain 05/10/2016   Abnormal EKG 05/10/2016   Murmur 05/10/2016   Mixed hyperlipidemia 05/10/2016   Hypertension, essential, benign 05/03/2016   Insomnia 05/03/2016   Type 2 diabetes mellitus with other specified complication (HCC) 05/03/2016   Generalized osteoarthritis of multiple sites 05/03/2016   Gastric mass 11/05/2014   Past Medical History:  Diagnosis Date   Arthritis    Chicken pox     Diabetes mellitus without complication (HCC)    Family history of polyps in the colon    Frequent headaches    Glaucoma    Heart murmur    Hypertension     Family History  Problem Relation Age of Onset   Diabetes Mother    Hypertension Mother    Hyperlipidemia Mother    Diabetes Sister    Hypertension Sister    Diabetes Brother     Past Surgical History:  Procedure Laterality Date   ABDOMINAL HYSTERECTOMY     CHOLECYSTECTOMY     FOOT SURGERY Left    TUBAL LIGATION     TUMOR REMOVAL  2017   Stomach tumor   Social History   Occupational History   Not on file  Tobacco Use   Smoking status: Never   Smokeless tobacco: Never  Vaping Use   Vaping Use: Never used  Substance and Sexual Activity   Alcohol use: No   Drug use: No   Sexual activity: Not on file

## 2022-06-15 LAB — HEMOGLOBIN A1C
Hgb A1c MFr Bld: 5.7 % of total Hgb — ABNORMAL HIGH (ref <5.7)
Mean Plasma Glucose: 117 mg/dL
eAG (mmol/L): 6.5 mmol/L

## 2022-06-24 ENCOUNTER — Telehealth: Payer: Self-pay | Admitting: Orthopaedic Surgery

## 2022-06-24 NOTE — Telephone Encounter (Signed)
Patient has question regarding having a dental cleaning before surgery and that she is still having a lot of pain in her hip and the meds are not working. Patient request a call back

## 2022-06-26 NOTE — Telephone Encounter (Signed)
She should avoid any elective dental procedures 6 weeks before and after THA.  Thanks.

## 2022-06-27 NOTE — Telephone Encounter (Signed)
Tried to call patient back. No answer. No voicemail. Will try again later. 

## 2022-06-29 NOTE — Telephone Encounter (Signed)
Called and notified patient. She actually cancelled her dental appointment due to soreness in her mouth. She will reschedule for late August or September.

## 2022-08-01 ENCOUNTER — Ambulatory Visit: Payer: Medicare Other

## 2022-08-01 DIAGNOSIS — M1611 Unilateral primary osteoarthritis, right hip: Secondary | ICD-10-CM

## 2022-08-02 LAB — HEMOGLOBIN A1C
Hgb A1c MFr Bld: 5.6 % of total Hgb (ref <5.7)
Mean Plasma Glucose: 114 mg/dL
eAG (mmol/L): 6.3 mmol/L

## 2022-08-02 LAB — PREALBUMIN: Prealbumin: 19 mg/dL (ref 17–34)

## 2022-08-04 ENCOUNTER — Other Ambulatory Visit: Payer: Self-pay | Admitting: Physician Assistant

## 2022-08-04 ENCOUNTER — Telehealth: Payer: Self-pay | Admitting: Orthopaedic Surgery

## 2022-08-04 MED ORDER — TRAMADOL HCL 50 MG PO TABS: 50.0000 mg | ORAL_TABLET | Freq: Three times a day (TID) | ORAL | 0 refills | Status: DC | PRN

## 2022-08-04 NOTE — Telephone Encounter (Signed)
Called and relayed information to patient.

## 2022-08-04 NOTE — Telephone Encounter (Signed)
Pt called stating she has surgery for her right hip 7/29 and her hip is swollen and severe pains and need medical advice. Please call pt at [phone]

## 2022-08-04 NOTE — Telephone Encounter (Signed)
Sent in tramadol to [facility].  I would ice and back off activity

## 2022-08-09 ENCOUNTER — Other Ambulatory Visit: Payer: Self-pay

## 2022-08-10 ENCOUNTER — Encounter (HOSPITAL_COMMUNITY): Payer: Self-pay

## 2022-08-10 NOTE — Progress Notes (Addendum)
PCP - Carmel Sacramento, NP Cardiologist - Dr Rosita Kea  Chest x-ray - 02/16/21 (2V) EKG - 08/11/22 Stress Test - 03/08/18 ECHO - 07/08/19 Cardiac Cath - n/a  ICD Pacemaker/Loop - n/a  Sleep Study -  n/a CPAP - none  Diabetes Type 2  Hold Farxiga diabetes medicines 72 hours prior to procedure.  Last dose will be on 7/25//24.  Patient states she "takes this medication for her kidneys."  If your blood sugar is less than 70 mg/dL, you will need to treat for low blood sugar: Treat a low blood sugar (less than 70 mg/dL) with  cup of clear juice (cranberry or apple), 4 glucose tablets, OR glucose gel. Recheck blood sugar in 15 minutes after treatment (to make sure it is greater than 70 mg/dL). If your blood sugar is not greater than 70 mg/dL on recheck, call [phone] for further instructions.  Blood Thinner Instructions: Follow your surgeon's instructions on when to stop Plavix prior to surgery.  Patient agreed to call the office for those instructions.   ERAS: Clear liquids til 9:40 AM DOSPlease complete your PRE-SURGERY G2 Drink that was provided to you by 9:40 AM, the morning of surgery.  Please, if able, drink it in one setting. DO NOT SIP.  Anesthesia review: Yes  STOP now taking any Aspirin (unless otherwise instructed by your surgeon), Aleve, Naproxen, Ibuprofen, Motrin, Advil, Goody's, BC's, all herbal medications, fish oil, and all vitamins.   Coronavirus Screening Do you have any of the following symptoms:  Cough yes/no: No Fever (>100.30F)  yes/no: No Runny nose yes/no: No Sore throat yes/no: No Difficulty breathing/shortness of breath  yes/no: No  Have you traveled in the last 14 days and where? yes/no: No  Patient verbalized understanding of instructions that were given to them at the PAT appointment. Patient was also instructed that they will need to review over the PAT instructions again at home before surgery.

## 2022-08-10 NOTE — Progress Notes (Signed)
Surgical Instructions    Your procedure is scheduled on Monday August 22, 2022.  Report to [facility] Main Entrance "A" at 10:40 A.M., then check in with the Admitting office.  Call this number if you have problems the morning of surgery:  [phone]   If you have any questions prior to your surgery date call [phone]: Open Monday-Friday 8am-4pm If you experience any cold or flu symptoms such as cough, fever, chills, shortness of breath, etc. between now and your scheduled surgery, please notify us at the above number     Remember:  Do not eat after midnight the night before your surgery.  You may drink clear liquids until 9:40 the morning of your surgery.   Clear liquids allowed are: Water, Non-Citrus Juices (without pulp), Carbonated Beverages, Clear Tea, Black Coffee ONLY (NO MILK, CREAM OR POWDERED CREAMER of any kind), and Gatorade.    Enhanced Recovery after Surgery for Orthopedics Enhanced Recovery after Surgery is a protocol used to improve the stress on your body and your recovery after surgery.  Patient Instructions The day of surgery (if you have diabetes):  Drink ONE small 10 oz bottle of water by 9:40 am the morning of surgery This bottle was given to you during your hospital  pre-op appointment visit.  Nothing else to drink after completing the  Small 10 oz bottle of water.         If you have questions, please contact your surgeon's office.   Take these medicines the morning of surgery with A SIP OF WATER:  febuxostat (ULORIC)  rosuvastatin (CRESTOR)   If needed:  oxyCODONE-acetaminophen (PERCOCET)  traMADol (ULTRAM)   Follow your surgeon's instructions on when to stop clopidogrel (PLAVIX).  If no instructions were given by your surgeon then you will need to call the office to get those instructions.    As of today, STOP taking any Aspirin (unless otherwise instructed by your surgeon) Aleve, Naproxen, Ibuprofen, Motrin, Advil, Goody's, BC's, all herbal  medications, fish oil, and all vitamins.  WHAT DO I DO ABOUT MY DIABETES MEDICATION?  Stop taking FARXIGA 3 dats prior to surgery. Last dose on 08/18/2022.  Do not take oral diabetes medicines (pills) the morning of surgery.  The day of surgery, do not take other diabetes injectables, including Byetta (exenatide), Bydureon (exenatide ER), Victoza (liraglutide), or Trulicity (dulaglutide).  If your CBG is greater than 220 mg/dL, you may take  of your sliding scale (correction) dose of insulin.   HOW TO MANAGE YOUR DIABETES BEFORE AND AFTER SURGERY  Why is it important to control my blood sugar before and after surgery? Improving blood sugar levels before and after surgery helps healing and can limit problems. A way of improving blood sugar control is eating a healthy diet by:  Eating less sugar and carbohydrates  Increasing activity/exercise  Talking with your doctor about reaching your blood sugar goals High blood sugars (greater than 180 mg/dL) can raise your risk of infections and slow your recovery, so you will need to focus on controlling your diabetes during the weeks before surgery. Make sure that the doctor who takes care of your diabetes knows about your planned surgery including the date and location.  How do I manage my blood sugar before surgery? Check your blood sugar at least 4 times a day, starting 2 days before surgery, to make sure that the level is not too high or low.  Check your blood sugar the morning of your surgery when you wake up  and every 2 hours until you get to the Short Stay unit.  If your blood sugar is less than 70 mg/dL, you will need to treat for low blood sugar: Do not take insulin. Treat a low blood sugar (less than 70 mg/dL) with  cup of clear juice (cranberry or apple), 4 glucose tablets, OR glucose gel. Recheck blood sugar in 15 minutes after treatment (to make sure it is greater than 70 mg/dL). If your blood sugar is not greater than 70 mg/dL  on recheck, call [phone] for further instructions. Report your blood sugar to the short stay nurse when you get to Short Stay.  If you are admitted to the hospital after surgery: Your blood sugar will be checked by the staff and you will probably be given insulin after surgery (instead of oral diabetes medicines) to make sure you have good blood sugar levels. The goal for blood sugar control after surgery is 80-180 mg/dL.   Special instructions:    Oral Hygiene is also important to reduce your risk of infection.  Remember - BRUSH YOUR TEETH THE MORNING OF SURGERY WITH YOUR REGULAR TOOTHPASTE   [place]- Preparing For Surgery   Pre-operative 5 CHG Bath Instructions   You can play a key role in reducing the risk of infection after surgery. Your skin needs to be as free of germs as possible. You can reduce the number of germs on your skin by washing with CHG (chlorhexidine gluconate) soap before surgery. CHG is an antiseptic soap that kills germs and continues to kill germs even after washing.   DO NOT use if you have an allergy to chlorhexidine/CHG or antibacterial soaps. If your skin becomes reddened or irritated, stop using the CHG and notify one of our RNs at [phone].   Please shower with the CHG soap starting 4 days before surgery using the following schedule:     Please keep in mind the following:  DO NOT shave, including legs and underarms, starting the day of your first shower.   You may shave your face at any point before/day of surgery.  Place clean sheets on your bed the day you start using CHG soap. Use a clean washcloth (not used since being washed) for each shower. DO NOT sleep with pets once you start using the CHG.   CHG Shower Instructions:  If you choose to wash your hair and private area, wash first with your normal shampoo/soap.  After you use shampoo/soap, rinse your hair and body thoroughly to remove shampoo/soap residue.  Turn the water OFF and  apply about 3 tablespoons (45 ml) of CHG soap to a CLEAN washcloth.  Apply CHG soap ONLY FROM YOUR NECK DOWN TO YOUR TOES (washing for 3-5 minutes)  DO NOT use CHG soap on face, private areas, open wounds, or sores.  Pay special attention to the area where your surgery is being performed.  If you are having back surgery, having someone wash your back for you may be helpful. Wait 2 minutes after CHG soap is applied, then you may rinse off the CHG soap.  Pat dry with a clean towel  Put on clean clothes/pajamas   If you choose to wear lotion, please use ONLY the CHG-compatible lotions on the back of this paper.     Additional instructions for the day of surgery: DO NOT APPLY any lotions, deodorants, cologne, or perfumes.   Put on clean/comfortable clothes.  Brush your teeth.  Ask your nurse before applying any prescription medications  to the skin.  Do not wear nail polish, gel polish, artificial nails, or any other type of covering on natural nails (fingers and toes) If you have artificial nails or gel coating that need to be removed by a nail salon, please have this removed prior to surgery. Artificial nails or gel coating may interfere with anesthesia's ability to adequately monitor your vital signs.      CHG Compatible Lotions   Aveeno Moisturizing lotion  Cetaphil Moisturizing Cream  Cetaphil Moisturizing Lotion  Clairol Herbal Essence Moisturizing Lotion, Dry Skin  Clairol Herbal Essence Moisturizing Lotion, Extra Dry Skin  Clairol Herbal Essence Moisturizing Lotion, Normal Skin  Curel Age Defying Therapeutic Moisturizing Lotion with Alpha Hydroxy  Curel Extreme Care Body Lotion  Curel Soothing Hands Moisturizing Hand Lotion  Curel Therapeutic Moisturizing Cream, Fragrance-Free  Curel Therapeutic Moisturizing Lotion, Fragrance-Free  Curel Therapeutic Moisturizing Lotion, Original Formula  Eucerin Daily Replenishing Lotion  Eucerin Dry Skin Therapy Plus Alpha Hydroxy Crme   Eucerin Dry Skin Therapy Plus Alpha Hydroxy Lotion  Eucerin Original Crme  Eucerin Original Lotion  Eucerin Plus Crme Eucerin Plus Lotion  Eucerin TriLipid Replenishing Lotion  Keri Anti-Bacterial Hand Lotion  Keri Deep Conditioning Original Lotion Dry Skin Formula Softly Scented  Keri Deep Conditioning Original Lotion, Fragrance Free Sensitive Skin Formula  Keri Lotion Fast Absorbing Fragrance Free Sensitive Skin Formula  Keri Lotion Fast Absorbing Softly Scented Dry Skin Formula  Keri Original Lotion  Keri Skin Renewal Lotion Keri Silky Smooth Lotion  Keri Silky Smooth Sensitive Skin Lotion  Nivea Body Creamy Conditioning Oil  Nivea Body Extra Enriched Lotion  Nivea Body Original Lotion  Nivea Body Sheer Moisturizing Lotion Nivea Crme  Nivea Skin Firming Lotion  NutraDerm 30 Skin Lotion  NutraDerm Skin Lotion  NutraDerm Therapeutic Skin Cream  NutraDerm Therapeutic Skin Lotion  ProShield Protective Hand Cream  Provon moisturizing lotion  Do not bring valuables to the hospital.  [facility] is not responsible for any belongings or valuables.    Do NOT Smoke (Tobacco/Vaping)  24 hours prior to your procedure  If you use a CPAP at night, you may bring your mask for your overnight stay.   Contacts, glasses, hearing aids, dentures or partials may not be worn into surgery, please bring cases for these belongings   For patients admitted to the hospital, discharge time will be determined by your treatment team.   Patients discharged the day of surgery will not be allowed to drive home, and someone needs to stay with them for 24 hours.   SURGICAL WAITING ROOM VISITATION Patients having surgery or a procedure may have no more than 2 support people in the waiting area - these visitors may rotate.   Children under the age of 27 must have an adult with them who is not the patient. If the patient needs to stay at the hospital during part of their recovery, the visitor guidelines for  inpatient rooms apply. Pre-op nurse will coordinate an appropriate time for 1 support person to accompany patient in pre-op.  This support person may not rotate.   Please refer to [email or web address] for the visitor guidelines for Inpatients (after your surgery is over and you are in a regular room).   If you received a COVID test during your pre-op visit, it is requested that you wear a mask when out in public, stay away from anyone that may not be feeling well, and notify your surgeon if you develop symptoms. If you have been in contact  with anyone that has tested positive in the last 10 days, please notify your surgeon.    Please read over the following fact sheets that you were given.

## 2022-08-11 ENCOUNTER — Encounter (HOSPITAL_COMMUNITY)
Admission: RE | Admit: 2022-08-11 | Discharge: 2022-08-11 | Disposition: A | Discharge status: Home / Self Care | Payer: Medicare PPO | Source: Ambulatory Visit | Attending: Orthopaedic Surgery | Admitting: Orthopaedic Surgery

## 2022-08-11 ENCOUNTER — Encounter (HOSPITAL_COMMUNITY): Payer: Self-pay

## 2022-08-11 ENCOUNTER — Other Ambulatory Visit: Payer: Self-pay

## 2022-08-11 VITALS — BP 187/85 | HR 64 | Temp 98.5°F | Resp 17 | Ht 64.0 in | Wt 164.5 lb

## 2022-08-11 DIAGNOSIS — Z7902 Long term (current) use of antithrombotics/antiplatelets: Secondary | ICD-10-CM | POA: Insufficient documentation

## 2022-08-11 DIAGNOSIS — Z01818 Encounter for other preprocedural examination: Secondary | ICD-10-CM | POA: Insufficient documentation

## 2022-08-11 DIAGNOSIS — M1611 Unilateral primary osteoarthritis, right hip: Secondary | ICD-10-CM | POA: Diagnosis not present

## 2022-08-11 DIAGNOSIS — I10 Essential (primary) hypertension: Secondary | ICD-10-CM | POA: Diagnosis not present

## 2022-08-11 DIAGNOSIS — R001 Bradycardia, unspecified: Secondary | ICD-10-CM | POA: Insufficient documentation

## 2022-08-11 DIAGNOSIS — E119 Type 2 diabetes mellitus without complications: Secondary | ICD-10-CM | POA: Insufficient documentation

## 2022-08-11 DIAGNOSIS — H409 Unspecified glaucoma: Secondary | ICD-10-CM | POA: Diagnosis not present

## 2022-08-11 LAB — BASIC METABOLIC PANEL
Anion gap: 5 (ref 5–15)
BUN: 10 mg/dL (ref 8–23)
CO2: 30 mmol/L (ref 22–32)
Calcium: 9.1 mg/dL (ref 8.9–10.3)
Chloride: 106 mmol/L (ref 98–111)
Creatinine, Ser: 1.03 mg/dL — ABNORMAL HIGH (ref 0.44–1.00)
GFR, Estimated: 57 mL/min — ABNORMAL LOW (ref >60)
Glucose, Bld: 90 mg/dL (ref 70–99)
Potassium: 3.4 mmol/L — ABNORMAL LOW (ref 3.5–5.1)
Sodium: 141 mmol/L (ref 135–145)

## 2022-08-11 LAB — TYPE AND SCREEN
ABO/RH(D): A POS
Antibody Screen: NEGATIVE

## 2022-08-11 LAB — CBC
HCT: 42.3 % (ref 36.0–46.0)
Hemoglobin: 13 g/dL (ref 12.0–15.0)
MCH: 26.7 pg (ref 26.0–34.0)
MCHC: 30.7 g/dL (ref 30.0–36.0)
MCV: 87 fL (ref 80.0–100.0)
Platelets: 310 10*3/uL (ref 150–400)
RBC: 4.86 MIL/uL (ref 3.87–5.11)
RDW: 15.6 % — ABNORMAL HIGH (ref 11.5–15.5)
WBC: 5.1 10*3/uL (ref 4.0–10.5)
nRBC: 0 % (ref 0.0–0.2)

## 2022-08-11 LAB — SURGICAL PCR SCREEN
MRSA, PCR: NEGATIVE
Staphylococcus aureus: NEGATIVE

## 2022-08-12 ENCOUNTER — Encounter (HOSPITAL_COMMUNITY): Payer: Self-pay | Admitting: Vascular Surgery

## 2022-08-12 ENCOUNTER — Encounter (HOSPITAL_COMMUNITY): Payer: Self-pay

## 2022-08-12 NOTE — Progress Notes (Signed)
Anesthesia Chart Review:  Case: [id number] Date/Time: 08/22/22 1225   Procedure: RIGHT TOTAL HIP ARTHROPLASTY ANTERIOR APPROACH (Right: Hip) - 3-C   Anesthesia type: Spinal   Pre-op diagnosis: right hip osteoarthritis   Location: [facility] OR ROOM 06 / [facility] OR   Surgeons: Tarry Kos, MD       DISCUSSION: Patient is a 77 year old female scheduled for the above procedure.  History includes never smoker, HTN, DM2, murmur (trivial MR 07/08/19), glaucoma, headaches.   I called and spoke with patient. She is on Plavix for unclear reasons based on history she provided. She was not sure why she was on Plavix, as she denied history of peripheral or cardiac stents, poor circulation, TIA or CVA. An office note scanned under Media tab indicates carotid US showed < 50% stenosis in 12/2021. She wasn't quite sure how long she had been on it but believes it was started this year. She said she had PCP evaluation on 08/11/22 and reportedly provider is aware of surgery plans and had no objections. She also reported seeing cardiologist Dr. Mercy Riding within the past few years due to dizziness. Reportedly echo and event monitor were unremarkable. She will still have occasional brief lightheadedness without syncope. It does not see related to position or standing. She denied chest pain, SOB, edema, orthopnea, palpitations. Her activity is limited by her right hip pain but says about six months ago she was able to dance or walk as long as she wanted. She reported contacting Dr. Mercy Riding on 08/11/22 to discuss perioperative Plavix instructions and was told to hold a full 5 days before and 5 days after surgery. I sent communication to Dr. Roda Shutters about this.    A1c 5.6% 08/01/22.  Most recent records from [facility] including 07/30/22 cardiology note and 08/11/22 primary care note requested.   VS: BP (!) 187/85   Pulse 64   Temp 36.9 C   Resp 17   Ht 5\' 4"  (1.626 m)   Wt 74.6 kg   SpO2 97%   BMI 28.24 kg/m     PROVIDERS: Center, [facility] Carmel Sacramento, NP is PCP  Rosita Kea, MD is cardiologist    LABS: Labs reviewed: Acceptable for surgery. (all labs ordered are listed, but only abnormal results are displayed)  Labs Reviewed  CBC - Abnormal; Notable for the following components:      Result Value   RDW 15.6 (*)    All other components within normal limits  BASIC METABOLIC PANEL - Abnormal; Notable for the following components:   Potassium 3.4 (*)    Creatinine, Ser 1.03 (*)    GFR, Estimated 57 (*)    All other components within normal limits  SURGICAL PCR SCREEN  TYPE AND SCREEN    IMAGES: US Renal 09/06/21: IMPRESSION: Small cyst in the superior pole of the left kidney, otherwise unremarkable sonographic appearance of the bilateral kidneys.    EKG: 08/11/22: Sinus bradycardia at 59 bpm Minimal voltage criteria for LVH, may be normal variant ( R in aVL ) Nonspecific T wave abnormality   CV: US Carotid 01/06/22 (as outlined in 03/14/22 [facility] Masker at [facility]): Abnormal bilateral atherosclerosis without flow limiting stenosis, < 50% stenosis bilaterally    Echo 07/08/19: IMPRESSIONS   1. Left ventricular ejection fraction, by estimation, is 60 to 65%. The  left ventricle has normal function. The left ventricle has no regional  wall motion abnormalities. Left ventricular diastolic parameters were  normal.   2. Right  ventricular systolic function is normal. The right ventricular  size is mildly enlarged. There is normal pulmonary artery systolic  pressure. The estimated right ventricular systolic pressure is 28.6 mmHg.   3. The mitral valve is normal in structure. Trivial mitral valve  regurgitation.   4. The aortic valve is tricuspid. Aortic valve regurgitation is not  visualized. No aortic stenosis is present.   5. The inferior vena cava is dilated in size with >50% respiratory  variability, suggesting right atrial pressure of 8 mmHg.  - No AS or AR  by 03/14/18 echo at [facility].   Nuclear stress test 05/17/16: The left ventricular ejection fraction is normal (55-65%). Nuclear stress EF: 59%. Blood pressure demonstrated a hypertensive response to exercise. There was no ST segment deviation noted during stress. The study is normal. This is a low risk study. Normal stress nuclear study with no ischemia or infarction; EF 59 with normal wall motion.   Past Medical History:  Diagnosis Date   Arthritis    Chicken pox    Diabetes mellitus without complication (HCC)    type 2 - no meds, diet controlled   Family history of polyps in the colon    Frequent headaches    Glaucoma    bilateral   Heart murmur    never has caused any problems per patient on 08/11/22   Hypertension     Past Surgical History:  Procedure Laterality Date   ABDOMINAL HYSTERECTOMY     CHOLECYSTECTOMY     COLONOSCOPY     hx polyps   EYE SURGERY Bilateral    remove cataracts   FOOT SURGERY Left    TUBAL LIGATION     TUMOR REMOVAL  2017   Stomach tumor   WISDOM TOOTH EXTRACTION      MEDICATIONS:  ACCU-CHEK AVIVA PLUS test strip   Accu-Chek Softclix Lancets lancets   clopidogrel (PLAVIX) 75 MG tablet   dapagliflozin propanediol (FARXIGA) 10 MG TABS tablet   febuxostat (ULORIC) 40 MG tablet   lisinopril (ZESTRIL) 5 MG tablet   LUMIGAN 0.01 % SOLN   Multiple Vitamins-Minerals (MULTIVITAMIN ADULT) CHEW   oxyCODONE-acetaminophen (PERCOCET) 10-325 MG tablet   rosuvastatin (CRESTOR) 10 MG tablet   traMADol (ULTRAM) 50 MG tablet   No current facility-administered medications for this encounter.   She was instructed to hold Farxiga 72 hours prior to surgery.   Shonna Chock, PA-C Surgical Short Stay/Anesthesiology [facility] Phone [phone] [facility] Phone [phone] 08/12/2022 3:06 PM

## 2022-08-15 ENCOUNTER — Other Ambulatory Visit: Payer: Self-pay | Admitting: Physician Assistant

## 2022-08-15 ENCOUNTER — Telehealth: Payer: Self-pay | Admitting: Orthopaedic Surgery

## 2022-08-15 ENCOUNTER — Telehealth: Payer: Self-pay | Admitting: Orthopedic Surgery

## 2022-08-15 MED ORDER — OXYCODONE-ACETAMINOPHEN 5-325 MG PO TABS: 1.0000 | ORAL_TABLET | Freq: Four times a day (QID) | ORAL | 0 refills | Status: AC | PRN

## 2022-08-15 MED ORDER — METHOCARBAMOL 750 MG PO TABS: 750.0000 mg | ORAL_TABLET | Freq: Two times a day (BID) | ORAL | 2 refills | Status: DC | PRN

## 2022-08-15 MED ORDER — ONDANSETRON HCL 4 MG PO TABS: 4.0000 mg | ORAL_TABLET | Freq: Three times a day (TID) | ORAL | 0 refills | Status: DC | PRN

## 2022-08-15 MED ORDER — DOCUSATE SODIUM 100 MG PO CAPS: 100.0000 mg | ORAL_CAPSULE | Freq: Every day | ORAL | 2 refills | Status: DC | PRN

## 2022-08-15 MED ORDER — CEFADROXIL 500 MG PO CAPS: 500.0000 mg | ORAL_CAPSULE | Freq: Two times a day (BID) | ORAL | 0 refills | Status: DC

## 2022-08-15 MED ORDER — ASPIRIN 81 MG PO TBEC: 81.0000 mg | DELAYED_RELEASE_TABLET | Freq: Two times a day (BID) | ORAL | 0 refills | Status: DC

## 2022-08-15 NOTE — Telephone Encounter (Signed)
Shaida from [facility] called. Oxycodone tablets 120 was picked up 7/19.  Would like to know if another RX is needed? [phone]

## 2022-08-15 NOTE — Telephone Encounter (Signed)
Pt tested positive for Covid 08/14/22 and will need to R/S surgery I have already cancelled post op visit please advise

## 2022-08-16 ENCOUNTER — Other Ambulatory Visit: Payer: Self-pay | Admitting: Physician Assistant

## 2022-08-16 ENCOUNTER — Telehealth: Payer: Self-pay | Admitting: Physician Assistant

## 2022-08-16 NOTE — Telephone Encounter (Signed)
If she picked up 120 oxys on 7/19, have them cancel our oxy rx.

## 2022-08-16 NOTE — Telephone Encounter (Signed)
Sounds like she picked up 120 oxy on 7/19 so we will not refill tramadol

## 2022-08-16 NOTE — Progress Notes (Signed)
Patient called surgical short stay to inform us that she is scheduled for surgery on Monday, 07/29 but she was tested positive for COVID. Patient states that she called Dr. Roda Shutters office and notified them, too. Patient states that her surgery was canceled for Monday.

## 2022-08-16 NOTE — Telephone Encounter (Signed)
Notified Shaida at pharmacy. She will cancel tramadol and oxy.

## 2022-08-16 NOTE — Telephone Encounter (Signed)
Shaida from [facility] called. Oxycodone tablets 120 was picked up 7/19.  Would like to know if another RX is needed? [phone]

## 2022-08-16 NOTE — Telephone Encounter (Signed)
[facility] called. Says patient has picked Tramadol 7/11 and called in today for a refill. They need to know what medication patient should be taking. Bernerd Limbo would like a call back ASAP [phone]

## 2022-08-17 ENCOUNTER — Encounter (HOSPITAL_COMMUNITY): Payer: Self-pay | Admitting: Vascular Surgery

## 2022-08-17 NOTE — Progress Notes (Signed)
Anesthesia APP Follow-up: See my previous note from Date of Service 08/11/22. She was scheduled for right THA on 08/22/22 by Dr. Roda Shutters, but she tested positive for COVID-19 on 08/14/22, so surgery is currently cancelled.   08/10/22 primary care visit with Chrys Racer, DNP, FNP-C and 07/29/22 cardiology visit with Dr. Rosita Kea received. They both noted plans for surgery. Dr. Mercy Riding follow her for palpitations, dyspnea, HTN, carotid and aortic atherosclerosis.  He notes she had previously admission for lightheaded ness with negative work-up for TIA/CVA. Her EKG there showed NSR at 65 bpm, incomplete right BBB. Note outlines 12/2021 carotid US from my previous note, as well as her 2020 echo-although her most recent echo to date is from 07/08/19. He ordered an updated echocardiogram for dyspnea, although did not appear to be acute and timing of echo was not specified.    Dr. Terisa Starr note outlines: CAM Patch Interpretation 8-14 Day 01/14/22: Abnormal. - Predominant rhythm: NSR - PAC 0.1 % - PVC 7 %; - 1 short burst, paroxysmal Atrial Tachycardia (pAT) 1 episode 6 beats @ Avg 110 - 118 bpm  It appears also ordered routine carotid US and  LE arterial study prior to her 6 month follow-up. (She has had a carotid US within the past 12 months on 01/06/22 showing < 50 % stenosis.)   Dr. Mercy Riding appears to be prescribing the Plavix, although listed under Plan for HTN management, along with Farxiga, lisinopril, and rosuvastatin.   Since she has a pending echo, and he prescribes her Plavix, I ask surgeon to get input from Dr. Mercy Riding prior to when her surgery is rescheduled.  Shonna Chock, PA-C Surgical Short Stay/Anesthesiology [facility] Phone [phone] [facility] Phone [phone] 08/17/2022 11:17 AM

## 2022-08-18 ENCOUNTER — Telehealth: Payer: Self-pay | Admitting: Physician Assistant

## 2022-08-18 NOTE — Telephone Encounter (Signed)
Patient called and wanted to know if she still supposed to take cefadoroxl 500mg  before surgery? [phone]

## 2022-08-18 NOTE — Telephone Encounter (Signed)
Spoke with patient. She has already been taking all her post operative medications. I instructed her to stop taking them so that she has them after.

## 2022-08-18 NOTE — Telephone Encounter (Signed)
This is to be taken after surgery, but she has covid so she will be rescheduled.  She may save to take after surgery once she has it done

## 2022-08-22 ENCOUNTER — Encounter (HOSPITAL_COMMUNITY): Admission: RE | Payer: Self-pay | Source: Home / Self Care

## 2022-08-22 ENCOUNTER — Ambulatory Visit (HOSPITAL_COMMUNITY): Admission: RE | Admit: 2022-08-22 | Payer: Medicare PPO | Source: Home / Self Care | Admitting: Orthopaedic Surgery

## 2022-08-22 DIAGNOSIS — M1611 Unilateral primary osteoarthritis, right hip: Secondary | ICD-10-CM

## 2022-08-22 SURGERY — ARTHROPLASTY, HIP, TOTAL, ANTERIOR APPROACH
Anesthesia: Spinal | Site: Hip | Laterality: Right

## 2022-08-30 ENCOUNTER — Encounter: Payer: Self-pay | Admitting: Physician Assistant

## 2022-08-30 ENCOUNTER — Ambulatory Visit (INDEPENDENT_AMBULATORY_CARE_PROVIDER_SITE_OTHER): Payer: Medicare PPO | Admitting: Physician Assistant

## 2022-08-30 DIAGNOSIS — M1611 Unilateral primary osteoarthritis, right hip: Secondary | ICD-10-CM | POA: Diagnosis not present

## 2022-08-30 NOTE — Progress Notes (Signed)
Office Visit Note   Patient: Kelsey Conway           Date of Birth: [birth year]           MRN: [id number] Visit Date: 08/30/2022              Requested by: Center, [facility] [address] [place],  [place] [address] PCP: Center, [facility] Medical   Assessment & Plan: Visit Diagnoses:  1. Unilateral primary osteoarthritis, right hip       Follow-Up Instructions: Return if symptoms worsen or fail to improve.   Orders:  No orders of the defined types were placed in this encounter.  No orders of the defined types were placed in this encounter.     Procedures: No procedures performed   Clinical Data: No additional findings.   Subjective: Chief Complaint  Patient presents with   Right Hip - Pain    HPI patient is a pleasant 77 year old female who comes in today with continued right hip pain.  She was scheduled to undergo right total hip replacement recently but unfortunately came down with COVID.  She has been rescheduled for September 30.  She has been getting chronic pain meds from her PCP for years with her most recent prescription of 120 oxycodone tens picked up on 08/10/2022.  She is asked me to prescribe tramadol as this seems to help more than the oxycodone.  At this point, I have discussed with the patient that she will need to have her PCP manage her pain medicine and therefore write the tramadol.  She understands and agrees.  We will see her on surgery day.     Objective: Vital Signs: There were no vitals taken for this visit.    Ortho Exam unchanged right hip exam  Specialty Comments:  No specialty comments available.  Imaging: No new imaging   PMFS History: Patient Active Problem List   Diagnosis Date Noted   Spinal stenosis of lumbar region 03/29/2022   Ataxia 07/07/2019   Constipation 02/25/2019   Class 2 obesity with body mass index (BMI) of 36.0 to 36.9 in adult 05/09/2018   Low back pain 10/04/2016   Chest pain 05/10/2016    Abnormal EKG 05/10/2016   Murmur 05/10/2016   Mixed hyperlipidemia 05/10/2016   Hypertension, essential, benign 05/03/2016   Insomnia 05/03/2016   Type 2 diabetes mellitus with other specified complication (HCC) 05/03/2016   Generalized osteoarthritis of multiple sites 05/03/2016   Gastric mass 11/05/2014   Past Medical History:  Diagnosis Date   Arthritis    Chicken pox    Diabetes mellitus without complication (HCC)    type 2 - no meds, diet controlled   Family history of polyps in the colon    Frequent headaches    Glaucoma    bilateral   Heart murmur    trivial MR 07/08/19 echo   Hypertension     Family History  Problem Relation Age of Onset   Diabetes Mother    Hypertension Mother    Hyperlipidemia Mother    Diabetes Sister    Hypertension Sister    Diabetes Brother     Past Surgical History:  Procedure Laterality Date   ABDOMINAL HYSTERECTOMY     CHOLECYSTECTOMY     COLONOSCOPY     hx polyps   EYE SURGERY Bilateral    remove cataracts   FOOT SURGERY Left    TUBAL LIGATION     TUMOR REMOVAL  2017   Stomach tumor  WISDOM TOOTH EXTRACTION     Social History   Occupational History   Not on file  Tobacco Use   Smoking status: Never   Smokeless tobacco: Never  Vaping Use   Vaping status: Never Used  Substance and Sexual Activity   Alcohol use: No   Drug use: No   Sexual activity: Yes    Birth control/protection: Surgical    Comment: Hysterectomy

## 2022-09-06 ENCOUNTER — Encounter: Payer: Medicare Other | Admitting: Physician Assistant

## 2022-09-08 ENCOUNTER — Ambulatory Visit: Payer: Medicare Other | Admitting: Orthopedic Surgery

## 2022-09-28 ENCOUNTER — Ambulatory Visit: Payer: Medicare PPO | Admitting: Orthopaedic Surgery

## 2022-09-29 ENCOUNTER — Ambulatory Visit (INDEPENDENT_AMBULATORY_CARE_PROVIDER_SITE_OTHER): Payer: Medicare PPO | Admitting: Orthopaedic Surgery

## 2022-09-29 ENCOUNTER — Other Ambulatory Visit (INDEPENDENT_AMBULATORY_CARE_PROVIDER_SITE_OTHER): Payer: Medicare PPO

## 2022-09-29 DIAGNOSIS — M1611 Unilateral primary osteoarthritis, right hip: Secondary | ICD-10-CM

## 2022-09-29 MED ORDER — ACETAMINOPHEN-CODEINE 300-30 MG PO TABS: 1.0000 | ORAL_TABLET | Freq: Every day | ORAL | 0 refills | Status: AC | PRN

## 2022-09-29 NOTE — Progress Notes (Signed)
Office Visit Note   Patient: Kelsey Conway           Date of Birth: [birth year]           MRN: [id number] Visit Date: 09/29/2022              Requested by: Center, [facility] [address] [place],  [place] [address] PCP: Center, [facility] Medical   Assessment & Plan: Visit Diagnoses:  1. Primary osteoarthritis of right hip     Plan: Kelsey Conway is a 77 year old female with recent worsening in right hip pain and giving out sensation.  Reassurance was provided that x-rays do not show any acute abnormalities but continue to show severe bone-on-bone changes.  We will send in a prescription for Tylenol with codeine to help with breakthrough pain.  Tramadol no longer works.  I explained why we should not use oxycodone prior to surgery.  Follow-Up Instructions: No follow-ups on file.   Orders:  Orders Placed This Encounter  Procedures   XR HIP UNILAT W OR W/O PELVIS 2-3 VIEWS RIGHT   Meds ordered this encounter  Medications   acetaminophen-codeine (TYLENOL #3) 300-30 MG tablet    Sig: Take 1-2 tablets by mouth daily as needed for up to 7 days for moderate pain.    Dispense:  30 tablet    Refill:  0      Procedures: No procedures performed   Clinical Data: No additional findings.   Subjective: Chief Complaint  Patient presents with   Right Hip - Pain    HPI Kelsey Conway is a 77 year old female who comes in for recent worsening of right hip pain.  She is scheduled for a total hip replacement later this month.  She is now experiencing giving way sensation.  She would like to get a new x-ray to make sure that everything is okay.  She is having difficulty walking. Review of Systems   Objective: Vital Signs: There were no vitals taken for this visit.  Physical Exam  Ortho Exam Examination of right hip shows severe pain with movement of the hip joint.  Antalgic gait. Specialty Comments:  No specialty comments available.  Imaging: XR HIP UNILAT W OR W/O PELVIS  2-3 VIEWS RIGHT  Result Date: 09/29/2022 Advanced degenerative joint disease with bone-on-bone joint space narrowing.    PMFS History: Patient Active Problem List   Diagnosis Date Noted   Spinal stenosis of lumbar region 03/29/2022   Ataxia 07/07/2019   Constipation 02/25/2019   Class 2 obesity with body mass index (BMI) of 36.0 to 36.9 in adult 05/09/2018   Low back pain 10/04/2016   Chest pain 05/10/2016   Abnormal EKG 05/10/2016   Murmur 05/10/2016   Mixed hyperlipidemia 05/10/2016   Hypertension, essential, benign 05/03/2016   Insomnia 05/03/2016   Type 2 diabetes mellitus with other specified complication (HCC) 05/03/2016   Generalized osteoarthritis of multiple sites 05/03/2016   Gastric mass 11/05/2014   Past Medical History:  Diagnosis Date   Arthritis    Chicken pox    Diabetes mellitus without complication (HCC)    type 2 - no meds, diet controlled   Family history of polyps in the colon    Frequent headaches    Glaucoma    bilateral   Heart murmur    trivial MR 07/08/19 echo   Hypertension     Family History  Problem Relation Age of Onset   Diabetes Mother    Hypertension Mother    Hyperlipidemia  Mother    Diabetes Sister    Hypertension Sister    Diabetes Brother     Past Surgical History:  Procedure Laterality Date   ABDOMINAL HYSTERECTOMY     CHOLECYSTECTOMY     COLONOSCOPY     hx polyps   EYE SURGERY Bilateral    remove cataracts   FOOT SURGERY Left    TUBAL LIGATION     TUMOR REMOVAL  2017   Stomach tumor   WISDOM TOOTH EXTRACTION     Social History   Occupational History   Not on file  Tobacco Use   Smoking status: Never   Smokeless tobacco: Never  Vaping Use   Vaping status: Never Used  Substance and Sexual Activity   Alcohol use: No   Drug use: No   Sexual activity: Yes    Birth control/protection: Surgical    Comment: Hysterectomy

## 2022-10-11 NOTE — Pre-Procedure Instructions (Addendum)
Surgical Instructions   Your procedure is scheduled on October 24, 2022. Report to [facility] Main Entrance "A" at 12:20 P.M., then check in with the Admitting office. Any questions or running late day of surgery: call [phone]  Questions prior to your surgery date: call [phone], Monday-Friday, 8am-4pm. If you experience any cold or flu symptoms such as cough, fever, chills, shortness of breath, etc. between now and your scheduled surgery, please notify us at the above number.     Remember:  Do not eat after midnight the night before your surgery  You may drink clear liquids until 11:50 AM the morning of your surgery.   Clear liquids allowed are: Water, Non-Citrus Juices (without pulp), Carbonated Beverages, Clear Tea, Black Coffee Only (NO MILK, CREAM OR POWDERED CREAMER of any kind), and Gatorade.    Take these medicines the morning of surgery with A SIP OF WATER: febuxostat (ULORIC)  rosuvastatin (CRESTOR)    May take these medicines IF NEEDED: docusate sodium (COLACE)  methocarbamol (ROBAXIN-750)  oxyCODONE-acetaminophen (PERCOCET)  traMADol (ULTRAM)    One week prior to surgery, STOP taking any Aspirin (unless otherwise instructed by your surgeon) Aleve, Naproxen, Ibuprofen, Motrin, Advil, Goody's, BC's, all herbal medications, fish oil, and non-prescription vitamins.   WHAT DO I DO ABOUT MY DIABETES MEDICATION?   STOP taking your dapagliflozin propanediol (FARXIGA) three days prior to surgery. Your last dose will be September 26th.       HOW TO MANAGE YOUR DIABETES BEFORE AND AFTER SURGERY  Why is it important to control my blood sugar before and after surgery? Improving blood sugar levels before and after surgery helps healing and can limit problems. A way of improving blood sugar control is eating a healthy diet by:  Eating less sugar and carbohydrates  Increasing activity/exercise  Talking with your doctor about reaching your blood sugar goals High  blood sugars (greater than 180 mg/dL) can raise your risk of infections and slow your recovery, so you will need to focus on controlling your diabetes during the weeks before surgery. Make sure that the doctor who takes care of your diabetes knows about your planned surgery including the date and location.  How do I manage my blood sugar before surgery? Check your blood sugar at least 4 times a day, starting 2 days before surgery, to make sure that the level is not too high or low.  Check your blood sugar the morning of your surgery when you wake up and every 2 hours until you get to the Short Stay unit.  If your blood sugar is less than 70 mg/dL, you will need to treat for low blood sugar: Do not take insulin. Treat a low blood sugar (less than 70 mg/dL) with  cup of clear juice (cranberry or apple), 4 glucose tablets, OR glucose gel. Recheck blood sugar in 15 minutes after treatment (to make sure it is greater than 70 mg/dL). If your blood sugar is not greater than 70 mg/dL on recheck, call [phone] for further instructions. Report your blood sugar to the short stay nurse when you get to Short Stay.  If you are admitted to the hospital after surgery: Your blood sugar will be checked by the staff and you will probably be given insulin after surgery (instead of oral diabetes medicines) to make sure you have good blood sugar levels. The goal for blood sugar control after surgery is 80-180 mg/dL.  Do NOT Smoke (Tobacco/Vaping) for 24 hours prior to your procedure.  If you use a CPAP at night, you may bring your mask/headgear for your overnight stay.   You will be asked to remove any contacts, glasses, piercing's, hearing aid's, dentures/partials prior to surgery. Please bring cases for these items if needed.    Patients discharged the day of surgery will not be allowed to drive home, and someone needs to stay with them for 24 hours.  SURGICAL WAITING ROOM  VISITATION Patients may have no more than 2 support people in the waiting area - these visitors may rotate.   Pre-op nurse will coordinate an appropriate time for 1 ADULT support person, who may not rotate, to accompany patient in pre-op.  Children under the age of 3 must have an adult with them who is not the patient and must remain in the main waiting area with an adult.  If the patient needs to stay at the hospital during part of their recovery, the visitor guidelines for inpatient rooms apply.  Please refer to the [facility] website for the visitor guidelines for any additional information.   If you received a COVID test during your pre-op visit  it is requested that you wear a mask when out in public, stay away from anyone that may not be feeling well and notify your surgeon if you develop symptoms. If you have been in contact with anyone that has tested positive in the last 10 days please notify you surgeon.      Pre-operative 5 CHG Bathing Instructions   You can play a key role in reducing the risk of infection after surgery. Your skin needs to be as free of germs as possible. You can reduce the number of germs on your skin by washing with CHG (chlorhexidine gluconate) soap before surgery. CHG is an antiseptic soap that kills germs and continues to kill germs even after washing.   DO NOT use if you have an allergy to chlorhexidine/CHG or antibacterial soaps. If your skin becomes reddened or irritated, stop using the CHG and notify one of our RNs at [phone].   Please shower with the CHG soap starting 4 days before surgery using the following schedule:     Please keep in mind the following:  DO NOT shave, including legs and underarms, starting the day of your first shower.   You may shave your face at any point before/day of surgery.  Place clean sheets on your bed the day you start using CHG soap. Use a clean washcloth (not used since being washed) for each shower. DO NOT  sleep with pets once you start using the CHG.   CHG Shower Instructions:  If you choose to wash your hair and private area, wash first with your normal shampoo/soap.  After you use shampoo/soap, rinse your hair and body thoroughly to remove shampoo/soap residue.  Turn the water OFF and apply about 3 tablespoons (45 ml) of CHG soap to a CLEAN washcloth.  Apply CHG soap ONLY FROM YOUR NECK DOWN TO YOUR TOES (washing for 3-5 minutes)  DO NOT use CHG soap on face, private areas, open wounds, or sores.  Pay special attention to the area where your surgery is being performed.  If you are having back surgery, having someone wash your back for you may be helpful. Wait 2 minutes after CHG soap is applied, then you may rinse off the CHG soap.  Pat dry with a clean towel  Put on clean  clothes/pajamas   If you choose to wear lotion, please use ONLY the CHG-compatible lotions on the back of this paper.   Additional instructions for the day of surgery: DO NOT APPLY any lotions, deodorants, cologne, or perfumes.   Do not bring valuables to the hospital. [facility] is not responsible for any belongings/valuables. Do not wear nail polish, gel polish, artificial nails, or any other type of covering on natural nails (fingers and toes) Do not wear jewelry or makeup Put on clean/comfortable clothes.  Please brush your teeth.  Ask your nurse before applying any prescription medications to the skin.     CHG Compatible Lotions   Aveeno Moisturizing lotion  Cetaphil Moisturizing Cream  Cetaphil Moisturizing Lotion  Clairol Herbal Essence Moisturizing Lotion, Dry Skin  Clairol Herbal Essence Moisturizing Lotion, Extra Dry Skin  Clairol Herbal Essence Moisturizing Lotion, Normal Skin  Curel Age Defying Therapeutic Moisturizing Lotion with Alpha Hydroxy  Curel Extreme Care Body Lotion  Curel Soothing Hands Moisturizing Hand Lotion  Curel Therapeutic Moisturizing Cream, Fragrance-Free  Curel Therapeutic  Moisturizing Lotion, Fragrance-Free  Curel Therapeutic Moisturizing Lotion, Original Formula  Eucerin Daily Replenishing Lotion  Eucerin Dry Skin Therapy Plus Alpha Hydroxy Crme  Eucerin Dry Skin Therapy Plus Alpha Hydroxy Lotion  Eucerin Original Crme  Eucerin Original Lotion  Eucerin Plus Crme Eucerin Plus Lotion  Eucerin TriLipid Replenishing Lotion  Keri Anti-Bacterial Hand Lotion  Keri Deep Conditioning Original Lotion Dry Skin Formula Softly Scented  Keri Deep Conditioning Original Lotion, Fragrance Free Sensitive Skin Formula  Keri Lotion Fast Absorbing Fragrance Free Sensitive Skin Formula  Keri Lotion Fast Absorbing Softly Scented Dry Skin Formula  Keri Original Lotion  Keri Skin Renewal Lotion Keri Silky Smooth Lotion  Keri Silky Smooth Sensitive Skin Lotion  Nivea Body Creamy Conditioning Oil  Nivea Body Extra Enriched Lotion  Nivea Body Original Lotion  Nivea Body Sheer Moisturizing Lotion Nivea Crme  Nivea Skin Firming Lotion  NutraDerm 30 Skin Lotion  NutraDerm Skin Lotion  NutraDerm Therapeutic Skin Cream  NutraDerm Therapeutic Skin Lotion  ProShield Protective Hand Cream  Provon moisturizing lotion  Please read over the following fact sheets that you were given.

## 2022-10-12 ENCOUNTER — Encounter (HOSPITAL_COMMUNITY)
Admission: RE | Admit: 2022-10-12 | Discharge: 2022-10-12 | Disposition: A | Discharge status: Home / Self Care | Payer: Medicare PPO | Source: Ambulatory Visit | Attending: Orthopaedic Surgery | Admitting: Orthopaedic Surgery

## 2022-10-12 ENCOUNTER — Other Ambulatory Visit: Payer: Self-pay

## 2022-10-12 ENCOUNTER — Encounter (HOSPITAL_COMMUNITY): Payer: Self-pay

## 2022-10-12 VITALS — BP 200/90 | HR 65 | Temp 98.2°F | Ht 64.0 in | Wt 164.1 lb

## 2022-10-12 DIAGNOSIS — I10 Essential (primary) hypertension: Secondary | ICD-10-CM | POA: Insufficient documentation

## 2022-10-12 DIAGNOSIS — E119 Type 2 diabetes mellitus without complications: Secondary | ICD-10-CM | POA: Insufficient documentation

## 2022-10-12 DIAGNOSIS — H409 Unspecified glaucoma: Secondary | ICD-10-CM | POA: Diagnosis not present

## 2022-10-12 DIAGNOSIS — Z01812 Encounter for preprocedural laboratory examination: Secondary | ICD-10-CM | POA: Diagnosis present

## 2022-10-12 DIAGNOSIS — Z01818 Encounter for other preprocedural examination: Secondary | ICD-10-CM

## 2022-10-12 DIAGNOSIS — Z7902 Long term (current) use of antithrombotics/antiplatelets: Secondary | ICD-10-CM | POA: Insufficient documentation

## 2022-10-12 DIAGNOSIS — Z7984 Long term (current) use of oral hypoglycemic drugs: Secondary | ICD-10-CM | POA: Insufficient documentation

## 2022-10-12 DIAGNOSIS — M1611 Unilateral primary osteoarthritis, right hip: Secondary | ICD-10-CM | POA: Diagnosis not present

## 2022-10-12 HISTORY — DX: Chronic kidney disease, unspecified: N18.9

## 2022-10-12 LAB — HEMOGLOBIN A1C
Hgb A1c MFr Bld: 5.7 % — ABNORMAL HIGH (ref 4.8–5.6)
Mean Plasma Glucose: 116.89 mg/dL

## 2022-10-12 LAB — BASIC METABOLIC PANEL WITH GFR
Anion gap: 7 (ref 5–15)
BUN: 10 mg/dL (ref 8–23)
CO2: 25 mmol/L (ref 22–32)
Calcium: 9.2 mg/dL (ref 8.9–10.3)
Chloride: 108 mmol/L (ref 98–111)
Creatinine, Ser: 0.96 mg/dL (ref 0.44–1.00)
GFR, Estimated: >60 mL/min (ref >60)
Glucose, Bld: 92 mg/dL (ref 70–99)
Potassium: 3.8 mmol/L (ref 3.5–5.1)
Sodium: 140 mmol/L (ref 135–145)

## 2022-10-12 LAB — CBC
HCT: 42.4 % (ref 36.0–46.0)
Hemoglobin: 12.9 g/dL (ref 12.0–15.0)
MCH: 26.3 pg (ref 26.0–34.0)
MCHC: 30.4 g/dL (ref 30.0–36.0)
MCV: 86.5 fL (ref 80.0–100.0)
Platelets: 336 10*3/uL (ref 150–400)
RBC: 4.9 MIL/uL (ref 3.87–5.11)
RDW: 15 % (ref 11.5–15.5)
WBC: 5.1 10*3/uL (ref 4.0–10.5)
nRBC: 0 % (ref 0.0–0.2)

## 2022-10-12 LAB — TYPE AND SCREEN
ABO/RH(D): A POS
Antibody Screen: NEGATIVE

## 2022-10-12 LAB — GLUCOSE, CAPILLARY: Glucose-Capillary: 87 mg/dL (ref 70–99)

## 2022-10-12 LAB — SURGICAL PCR SCREEN
MRSA, PCR: NEGATIVE
Staphylococcus aureus: NEGATIVE

## 2022-10-12 NOTE — Progress Notes (Signed)
PCP - Dr. Carmel [place] Cardiologist - Dr. Rosita Kea with [facility]  PPM/ICD - Denies Device Orders - n/a Rep Notified - n/a  Chest x-ray - Denies EKG - 08/11/2022 Stress Test - 03/08/2018 ECHO - 07/08/2019- possibly completed one this year per pt. Requesting records Cardiac Cath - Denies  Sleep Study - Denies CPAP - n/a  Pt is DM2. Although last A1c in July was 5.6. CBG at pre-op 87. Pt checks blood sugar every other day. Normal fasting range is 80s-90s. A1c result pending.   Last dose of GLP1 agonist- n/a GLP1 instructions: n/a  Blood Thinner Instructions: Pt instructed to hold Plavix 5 days prior to surgery. Last dose will be September 24th.  Aspirin Instructions: n/a. ASA that is currently prescribed is for post-op blood clot prevention.  ERAS Protcol - Clear liquids until 0950 morning of surgery PRE-SURGERY Ensure or G2- Pt received a G2 at previous appointment. Instructed that it is okay to still take this DOS. Instructed to make it the last thing she has to drink that morning and finish it in one sitting.  COVID TEST- n/a. Positive for COVID July 2024 (postponed this surgery)   Anesthesia review: Yes. Discussed with Shonna Chock, PA-C. Pt originally scheduled for sx July 2024, but postponed due to COVID. Cardiology records requested at that PAT appointment, but none received. Pt was to have an echo completed around that time. Another request was faxed to [facility] for records. Pts BP also high, 200/90 manually, at visit. Per pt, she checks BP at home and its normally 120-140/70s. Instructed to start checking BP 4x/day and record. If it is anywhere near 200/90 she needs to call her PCP/Cardiologist. If she is symptomatic with her high BP (CP, SOB, Vision changes) she needs to be seen in an ER immediately. Pt understood instructions.    Patient denies shortness of breath, fever, cough and chest pain at PAT appointment. Pt denies any respiratory  illness/infection in the last two months.    All instructions explained to the patient, with a verbal understanding of the material. Patient agrees to go over the instructions while at home for a better understanding. Patient also instructed to self quarantine after being tested for COVID-19. The opportunity to ask questions was provided.

## 2022-10-13 NOTE — Progress Notes (Addendum)
Anesthesia Chart Review:  Case: [id number] Date/Time: 10/24/22 1130   Procedure: RIGHT TOTAL HIP ARTHROPLASTY ANTERIOR APPROACH (Right: Hip) - 3-C   Anesthesia type: Spinal   Pre-op diagnosis: right hip osteoarthritis   Location: [facility] ROOM 06 / [facility]   Surgeons: Tarry Kos, MD       DISCUSSION: Patient is a 77 year old female scheduled for the above procedure. Surgery was initially scheduled for 08/22/22, but she was + COVID-19 on 08/14/22.   History includes never smoker, HTN, DM2, murmur (trivial MR 07/08/19), glaucoma, headaches.   I spoke with her in July to clarify history. She was on Plavix but was without known peripheral or cardiac stents, poor circulation, TIA or CVA. She did have a carotid US in 12/2021 showing < 50% ICA stenosis. I reviewed the a 08/10/22 primary care visit with Chrys Racer, DNP, FNP-C and a 07/29/22 cardiology visit with Dr. Rosita Kea at that time. They both noted plans for surgery. Dr. Mercy Riding follow her for palpitations, dyspnea, HTN, carotid and aortic atherosclerosis. He notes she had previous admission for lightheaded ness with negative work-up for TIA/CVA. Her EKG there showed NSR at 65 bpm, incomplete right BBB. Note outlines 12/2021 carotid US and results from a 2020 echo, although most recent echo to date is from 07/08/19. He ordered an updated echocardiogram for dyspnea, although did not appear to be acute and timing of echo was not specified. She reported that he gave her instructions to hold Plavix for 5 days before and 5 days after surgery.    Dr. Mercy Riding appears to be prescribing the Plavix, although listed under Plan for HTN management, along with Farxiga, lisinopril, and rosuvastatin. Although timing of echo seemed more routine since her dyspnea was chronic, I did recommend surgeon clarify with cardiology. Patient is unsure if she had the echo done since July. Cardiology records requested. She reported last Plavix planned for 10/18/22.   Her BP was 200/90  at PAT. She is only on lisinopril 5 mg which she had taken. She was not acutely symptomatic. She said home BP readings typically ~ 120-140's/70's. She reported scheduled PCP visit for 10/13/22 and a new patient visit with Alonna Buckler AGNP-BC at [facility] next week. Will update Dr. Warren Danes staff and follow-up on records requested.   ADDENDUM 10/19/22 10:17 AM: Received 05/19/22 nephrology notes from Dr. Allena Katz for follow-up CKD 3a. I also received 10/13/22 PCP office note with Carmel Sacramento, NP. BP was elevated, but improved to 150/76. On 10/17/22, she told Dr. Warren Danes staff that Plavix was already on hold. She did have vascular labs on 09/23/22 at [place], but routine echo is not scheduled yet. She actually went to Dr. Terisa Starr office on 10/17/22 to wait from him to complete her surgical clearance letter. He wrote, "Brett Albino has been under my care. She is clear for surgery from a cardiovascular standpoint with no restrictions."  Anesthesia team to evaluate on the day of surgery.    VS: BP (!) 200/90 Comment: Manual  Pulse 65   Temp 36.8 C   Ht 5\' 4"  (1.626 m)   Wt 74.4 kg   SpO2 96%   BMI 28.17 kg/m  BP/HTN f/u at PCP office on 10/13/22 was 150/76.   PROVIDERS: Center, [facility] Carmel Sacramento, NP is PCP  Rosita Kea, MD is cardiologist  Zetta Bills, MD is nephrologist. 05/19/22 office note on shadow chart for follow-up CKD stage 3a.    LABS: Labs reviewed: Acceptable for  surgery. (all labs ordered are listed, but only abnormal results are displayed)  Labs Reviewed  HEMOGLOBIN A1C - Abnormal; Notable for the following components:      Result Value   Hgb A1c MFr Bld 5.7 (*)    All other components within normal limits  SURGICAL PCR SCREEN  GLUCOSE, CAPILLARY  BASIC METABOLIC PANEL  CBC  TYPE AND SCREEN     IMAGES: US Renal 09/06/21: IMPRESSION: Small cyst in the superior pole of the left kidney, otherwise unremarkable sonographic appearance of the bilateral kidneys.      EKG: 08/11/22: Sinus bradycardia at 59 bpm Minimal voltage criteria for LVH, may be normal variant ( R in aVL ) Nonspecific T wave abnormality     CV: US Carotid 01/06/22 (as outlined in 03/14/22 Mayer Masker at [facility]): Abnormal bilateral atherosclerosis without flow limiting stenosis, < 50% stenosis bilaterally    Dr. Terisa Starr note outlines: CAM Patch Interpretation 8-14 Day 01/14/22: Abnormal. - Predominant rhythm: NSR - PAC 0.1 % - PVC 7 %; - 1 short burst, paroxysmal Atrial Tachycardia (pAT) 1 episode 6 beats @ Avg 110 - 118 bpm    Echo 07/08/19: IMPRESSIONS   1. Left ventricular ejection fraction, by estimation, is 60 to 65%. The  left ventricle has normal function. The left ventricle has no regional  wall motion abnormalities. Left ventricular diastolic parameters were  normal.   2. Right ventricular systolic function is normal. The right ventricular  size is mildly enlarged. There is normal pulmonary artery systolic  pressure. The estimated right ventricular systolic pressure is 28.6 mmHg.   3. The mitral valve is normal in structure. Trivial mitral valve  regurgitation.   4. The aortic valve is tricuspid. Aortic valve regurgitation is not  visualized. No aortic stenosis is present.   5. The inferior vena cava is dilated in size with >50% respiratory  variability, suggesting right atrial pressure of 8 mmHg.  - No AS or AR by 03/14/18 echo at [facility].     Nuclear stress test 05/17/16: The left ventricular ejection fraction is normal (55-65%). Nuclear stress EF: 59%. Blood pressure demonstrated a hypertensive response to exercise. There was no ST segment deviation noted during stress. The study is normal. This is a low risk study. Normal stress nuclear study with no ischemia or infarction; EF 59 with normal wall motion.   Past Medical History:  Diagnosis Date   Arthritis    Chicken pox    Chronic kidney disease    Takes Farxiga for  kidney protection   Diabetes mellitus without complication (HCC)    type 2 - no meds, diet controlled   Family history of polyps in the colon    Frequent headaches    Glaucoma    bilateral   Heart murmur    trivial MR 07/08/19 echo   Hypertension     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   WISDOM TOOTH EXTRACTION      MEDICATIONS:  ACCU-CHEK AVIVA PLUS test strip   Accu-Chek Softclix Lancets lancets   aspirin EC 81  MG tablet   cefadroxil (DURICEF) 500 MG capsule   clopidogrel (PLAVIX) 75 MG tablet   dapagliflozin propanediol (FARXIGA) 10 MG TABS tablet   docusate sodium (COLACE) 100 MG capsule   febuxostat (ULORIC) 40 MG tablet   lisinopril (ZESTRIL) 5 MG tablet   LUMIGAN 0.01 % SOLN   methocarbamol (ROBAXIN-750) 750 MG tablet   Multiple Vitamins-Minerals (MULTIVITAMIN ADULT) CHEW   ondansetron (ZOFRAN) 4 MG tablet   oxyCODONE-acetaminophen (PERCOCET) 10-325 MG tablet   oxyCODONE-acetaminophen (PERCOCET) 5-325 MG tablet   rosuvastatin (CRESTOR) 10 MG tablet   traMADol (ULTRAM) 50 MG tablet   No current facility-administered medications for this encounter.  ASA, Colace, Robaxin, and cefadroxil have been prescribed for procedure.   Shonna Chock, PA-C Surgical Short Stay/Anesthesiology [facility] Phone [phone] [facility] Phone [phone] 10/13/2022 5:04 PM

## 2022-10-14 ENCOUNTER — Telehealth: Payer: Self-pay | Admitting: Orthopaedic Surgery

## 2022-10-14 NOTE — Telephone Encounter (Signed)
Patient is scheduled for right total hip arthroplasty 10-24-22 with Dr. Roda Shutters and is requesting a letter for her son to give to his employer showing him out of work due to taking care of his mother.  Son is Antonio. He lives in [place], [place] and will traveling her specifically to help her with the recovery.   His contact number is [phone].  Please assist him with necessary documentation.

## 2022-10-18 NOTE — Telephone Encounter (Signed)
Note has been emailed to [email or web address] at [email or web address]

## 2022-10-18 NOTE — Anesthesia Preprocedure Evaluation (Addendum)
Anesthesia Evaluation  Patient identified by MRN, date of birth, ID band Patient awake    Reviewed: Allergy & Precautions, H&P , NPO status , Patient's Chart, lab work & pertinent test results  Airway Mallampati: II  TM Distance: >3 FB Neck ROM: Full    Dental  (+) Dental Advisory Given   Pulmonary neg pulmonary ROS   breath sounds clear to auscultation       Cardiovascular Exercise Tolerance: Good hypertension, + Valvular Problems/Murmurs  Rhythm:Regular Rate:Normal     Neuro/Psych  Headaches negative neurological ROS  negative psych ROS   GI/Hepatic negative GI ROS, Neg liver ROS,,,  Endo/Other  diabetes    Renal/GU Renal diseasenegative Renal ROS  negative genitourinary   Musculoskeletal  (+) Arthritis ,    Abdominal   Peds  Hematology negative hematology ROS (+) Last plavix 9/22   Anesthesia Other Findings   Reproductive/Obstetrics negative OB ROS                             Anesthesia Physical Anesthesia Plan  ASA: 3  Anesthesia Plan: Spinal and MAC   Post-op Pain Management: Celebrex PO (pre-op)* and Tylenol PO (pre-op)*   Induction: Intravenous  PONV Risk Score and Plan: 2 and Ondansetron, Treatment may vary due to age or medical condition and Propofol infusion  Airway Management Planned: Natural Airway  Additional Equipment: None  Intra-op Plan:   Post-operative Plan: Extubation in OR  Informed Consent: I have reviewed the patients History and Physical, chart, labs and discussed the procedure including the risks, benefits and alternatives for the proposed anesthesia with the patient or authorized representative who has indicated his/her understanding and acceptance.       Plan Discussed with: Anesthesiologist and CRNA  Anesthesia Plan Comments: (PAT note written by Shonna Chock, PA-C.   DISCUSSION: Patient is a 77 year old female scheduled for the above  procedure. Surgery was initially scheduled for 08/22/22, but she was + COVID-19 on 08/14/22.   History includes never smoker, HTN, DM2, murmur (trivial MR 07/08/19), glaucoma, headaches.    I spoke with her in July to clarify history. She was on Plavix but was without known peripheral or cardiac stents, poor circulation, TIA or CVA. She did have a carotid US in 12/2021 showing < 50% ICA stenosis. I reviewed the a 08/10/22 primary care visit with Chrys Racer, DNP, FNP-C and a 07/29/22 cardiology visit with Dr. Rosita Kea at that time. They both noted plans for surgery. Dr. Mercy Riding follow her for palpitations, dyspnea, HTN, carotid and aortic atherosclerosis. He notes she had previous admission for lightheaded ness with negative work-up for TIA/CVA. Her EKG there showed NSR at 65 bpm, incomplete right BBB. Note outlines 12/2021 carotid US and results from a 2020 echo, although most recent echo to date is from 07/08/19. He ordered an updated echocardiogram for dyspnea, although did not appear to be acute and timing of echo was not specified. She reported that he gave her instructions to hold Plavix for 5 days before and 5 days after surgery.    Dr. Mercy Riding appears to be prescribing the Plavix, although listed under Plan for HTN management, along with Farxiga, lisinopril, and rosuvastatin. Although timing of echo seemed more routine since her dyspnea was chronic, I did recommend surgeon clarify with cardiology. Patient is unsure if she had the echo done since July. Cardiology records requested. She reported last Plavix planned for 10/18/22.    Her BP  was 200/90 at PAT. She is only on lisinopril 5 mg which she had taken. She was not acutely symptomatic. She said home BP readings typically ~ 120-140's/70's. She reported scheduled PCP visit for 10/13/22 and a new patient visit with Alonna Buckler AGNP-BC at [facility] next week. Will update Dr. Warren Danes staff and follow-up on records requested.  EKG:  08/11/22: Sinus bradycardia at 59 bpm Minimal voltage criteria for LVH, may be normal variant ( R in aVL ) Nonspecific T wave abnormality     CV: US Carotid 01/06/22 (as outlined in 03/14/22 Mayer Masker at [facility]): Abnormal bilateral atherosclerosis without flow limiting stenosis, < 50% stenosis bilaterally     Dr. Terisa Starr note outlines: CAM Patch Interpretation 8-14 Day 01/14/22: Abnormal. - Predominant rhythm: NSR - PAC 0.1 % - PVC 7 %; - 1 short burst, paroxysmal Atrial Tachycardia (pAT) 1 episode 6 beats @ Avg 110 - 118 bpm     Echo 07/08/19: IMPRESSIONS   1. Left ventricular ejection fraction, by estimation, is 60 to 65%. The  left ventricle has normal function. The left ventricle has no regional  wall motion abnormalities. Left ventricular diastolic parameters were  normal.   2. Right ventricular systolic function is normal. The right ventricular  size is mildly enlarged. There is normal pulmonary artery systolic  pressure. The estimated right ventricular systolic pressure is 28.6 mmHg.   3. The mitral valve is normal in structure. Trivial mitral valve  regurgitation.   4. The aortic valve is tricuspid. Aortic valve regurgitation is not  visualized. No aortic stenosis is present.   5. The inferior vena cava is dilated in size with >50% respiratory  variability, suggesting right atrial pressure of 8 mmHg.  - No AS or AR by 03/14/18 echo at [facility].     Nuclear stress test 05/17/16:  The left ventricular ejection fraction is normal (55-65%).  Nuclear stress EF: 59%.  Blood pressure demonstrated a hypertensive response to exercise.  There was no ST segment deviation noted during stress.  The study is normal.  This is a low risk study. Normal stress nuclear study with no ischemia or infarction; EF 59 with normal wall motion.    ADDENDUM 10/19/22 10:17 AM: Received 05/19/22 nephrology notes from Dr. Allena Katz for follow-up CKD 3a. I also received  10/13/22 PCP office note with Carmel Sacramento, NP. BP was elevated, but improved to 150/76. On 10/17/22, she told Dr. Warren Danes staff that Plavix was already on hold. She did have vascular labs on 09/23/22 at [place], but routine echo is not scheduled yet. She actually went to Dr. Terisa Starr office on 10/17/22 to wait from him to complete her surgical clearance letter. He wrote, "Brett Albino has been under my care. She is clear for surgery from a cardiovascular standpoint with no restrictions." )       Anesthesia Quick Evaluation

## 2022-10-19 ENCOUNTER — Telehealth: Payer: Self-pay | Admitting: Orthopaedic Surgery

## 2022-10-19 ENCOUNTER — Other Ambulatory Visit: Payer: Self-pay | Admitting: Physician Assistant

## 2022-10-19 MED ORDER — ONDANSETRON HCL 4 MG PO TABS: 4.0000 mg | ORAL_TABLET | Freq: Three times a day (TID) | ORAL | 0 refills | Status: AC | PRN

## 2022-10-19 MED ORDER — DOCUSATE SODIUM 100 MG PO CAPS: 100.0000 mg | ORAL_CAPSULE | Freq: Every day | ORAL | 2 refills | Status: AC | PRN

## 2022-10-19 MED ORDER — DOXYCYCLINE HYCLATE 100 MG PO CAPS: 100.0000 mg | ORAL_CAPSULE | Freq: Two times a day (BID) | ORAL | 0 refills | Status: AC

## 2022-10-19 MED ORDER — METHOCARBAMOL 750 MG PO TABS: 750.0000 mg | ORAL_TABLET | Freq: Two times a day (BID) | ORAL | 2 refills | Status: AC | PRN

## 2022-10-19 MED ORDER — OXYCODONE-ACETAMINOPHEN 5-325 MG PO TABS
1.0000 | ORAL_TABLET | Freq: Four times a day (QID) | ORAL | 0 refills | Status: AC | PRN
Start: 2022-10-19 — End: ?

## 2022-10-19 MED ORDER — ASPIRIN 81 MG PO TBEC: 81.0000 mg | DELAYED_RELEASE_TABLET | Freq: Every day | ORAL | 0 refills | Status: AC

## 2022-10-19 NOTE — Telephone Encounter (Signed)
Patient is scheduled for right total hip on Monday 10-24-22.  She would like to be sure and get a walker, chair-type toilet seat, and a shower seat  to have at home for her recovery.  She mentioned this at the pre-admission visit but did not get a clear response.  If these items are not available to her when she leaves the hospital she is asking if a script can be provided so she can get them. Patient would also like to know if physical therapy will be scheduled for her in the home right after surgery.  Can she make those appointments before the surgery or only after she is home.  Who will be calling?  Patients call back number is [phone]

## 2022-10-20 NOTE — Telephone Encounter (Signed)
Notified patient that this will all get taken care of at the hospital and she will also get HHPT.

## 2022-10-21 NOTE — Progress Notes (Signed)
Patient was called to be informed that the surgery time for Monday was changed; patient unavailable and this writer left a message. Patient was instructed to be at the hospital at 09:45 o'clock and stop drinking clear liquids at 08:45 o'clock.

## 2022-10-24 ENCOUNTER — Encounter (HOSPITAL_COMMUNITY)
Admission: RE | Disposition: A | Discharge status: Home / Self Care | Payer: Self-pay | Source: Home / Self Care | Attending: Orthopaedic Surgery

## 2022-10-24 ENCOUNTER — Ambulatory Visit (HOSPITAL_COMMUNITY): Payer: Medicare PPO | Admitting: Vascular Surgery

## 2022-10-24 ENCOUNTER — Ambulatory Visit (HOSPITAL_COMMUNITY): Payer: Medicare PPO

## 2022-10-24 ENCOUNTER — Other Ambulatory Visit: Payer: Self-pay

## 2022-10-24 ENCOUNTER — Encounter (HOSPITAL_COMMUNITY): Payer: Self-pay | Admitting: Orthopaedic Surgery

## 2022-10-24 ENCOUNTER — Observation Stay (HOSPITAL_COMMUNITY)
Admission: RE | Admit: 2022-10-24 | Discharge: 2022-10-26 | Disposition: A | Discharge status: Home / Self Care | Payer: Medicare PPO | Attending: Orthopaedic Surgery | Admitting: Orthopaedic Surgery

## 2022-10-24 ENCOUNTER — Ambulatory Visit (HOSPITAL_COMMUNITY): Payer: Self-pay | Admitting: Anesthesiology

## 2022-10-24 ENCOUNTER — Observation Stay (HOSPITAL_COMMUNITY): Payer: Medicare PPO

## 2022-10-24 DIAGNOSIS — Z79899 Other long term (current) drug therapy: Secondary | ICD-10-CM | POA: Diagnosis not present

## 2022-10-24 DIAGNOSIS — I129 Hypertensive chronic kidney disease with stage 1 through stage 4 chronic kidney disease, or unspecified chronic kidney disease: Secondary | ICD-10-CM | POA: Diagnosis not present

## 2022-10-24 DIAGNOSIS — E119 Type 2 diabetes mellitus without complications: Secondary | ICD-10-CM | POA: Diagnosis not present

## 2022-10-24 DIAGNOSIS — E1122 Type 2 diabetes mellitus with diabetic chronic kidney disease: Secondary | ICD-10-CM | POA: Insufficient documentation

## 2022-10-24 DIAGNOSIS — M1611 Unilateral primary osteoarthritis, right hip: Principal | ICD-10-CM | POA: Insufficient documentation

## 2022-10-24 DIAGNOSIS — Z7902 Long term (current) use of antithrombotics/antiplatelets: Secondary | ICD-10-CM | POA: Diagnosis not present

## 2022-10-24 DIAGNOSIS — Z7982 Long term (current) use of aspirin: Secondary | ICD-10-CM | POA: Diagnosis not present

## 2022-10-24 DIAGNOSIS — N189 Chronic kidney disease, unspecified: Secondary | ICD-10-CM | POA: Insufficient documentation

## 2022-10-24 DIAGNOSIS — Z96641 Presence of right artificial hip joint: Secondary | ICD-10-CM

## 2022-10-24 HISTORY — PX: TOTAL HIP ARTHROPLASTY: SHX124

## 2022-10-24 LAB — GLUCOSE, CAPILLARY
Glucose-Capillary: 83 mg/dL (ref 70–99)
Glucose-Capillary: 89 mg/dL (ref 70–99)
Glucose-Capillary: 79 mg/dL (ref 70–99)
Glucose-Capillary: 115 mg/dL — ABNORMAL HIGH (ref 70–99)

## 2022-10-24 SURGERY — ARTHROPLASTY, HIP, TOTAL, ANTERIOR APPROACH
Anesthesia: Monitor Anesthesia Care | Site: Hip | Laterality: Right

## 2022-10-24 MED ORDER — VANCOMYCIN HCL 1 G IV SOLR
INTRAVENOUS | Status: DC | PRN
  Administered 2022-10-24: 1000 mg via TOPICAL

## 2022-10-24 MED ORDER — DIPHENHYDRAMINE HCL 12.5 MG/5ML PO ELIX: 25.0000 mg | ORAL_SOLUTION | ORAL | Status: DC | PRN

## 2022-10-24 MED ORDER — ONDANSETRON HCL 4 MG/2ML IJ SOLN: 4.0000 mg | Freq: Once | INTRAMUSCULAR | Status: DC | PRN

## 2022-10-24 MED ORDER — CELECOXIB 200 MG PO CAPS
200.0000 mg | ORAL_CAPSULE | Freq: Once | ORAL | Status: AC
  Administered 2022-10-24: 200 mg via ORAL
  Filled 2022-10-24: qty 1

## 2022-10-24 MED ORDER — SODIUM CHLORIDE 0.9 % IV SOLN: INTRAVENOUS | Status: DC

## 2022-10-24 MED ORDER — SODIUM CHLORIDE 0.9 % IR SOLN
Status: DC | PRN
  Administered 2022-10-24: 1000 mL

## 2022-10-24 MED ORDER — FENTANYL CITRATE (PF) 250 MCG/5ML IJ SOLN
INTRAMUSCULAR | Status: AC
  Filled 2022-10-24: qty 5

## 2022-10-24 MED ORDER — ACETAMINOPHEN 500 MG PO TABS
1000.0000 mg | ORAL_TABLET | Freq: Once | ORAL | Status: AC
  Administered 2022-10-24: 1000 mg via ORAL
  Filled 2022-10-24: qty 2

## 2022-10-24 MED ORDER — SORBITOL 70 % SOLN: 30.0000 mL | Freq: Every day | Status: DC | PRN

## 2022-10-24 MED ORDER — PROPOFOL 10 MG/ML IV BOLUS
INTRAVENOUS | Status: AC
  Filled 2022-10-24: qty 20

## 2022-10-24 MED ORDER — HYDRALAZINE HCL 20 MG/ML IJ SOLN
10.0000 mg | Freq: Once | INTRAMUSCULAR | Status: AC
  Administered 2022-10-24: 10 mg via INTRAVENOUS

## 2022-10-24 MED ORDER — STERILE WATER FOR IRRIGATION IR SOLN
Status: DC | PRN
Start: 2022-10-24 — End: 2022-10-24
  Administered 2022-10-24: 1000 mL

## 2022-10-24 MED ORDER — INSULIN ASPART 100 UNIT/ML IJ SOLN: 0.0000 [IU] | Freq: Every day | INTRAMUSCULAR | Status: DC

## 2022-10-24 MED ORDER — ACETAMINOPHEN 325 MG PO TABS: 325.0000 mg | ORAL_TABLET | Freq: Four times a day (QID) | ORAL | Status: DC | PRN

## 2022-10-24 MED ORDER — TRANEXAMIC ACID 1000 MG/10ML IV SOLN
2000.0000 mg | INTRAVENOUS | Status: DC
  Filled 2022-10-24: qty 20

## 2022-10-24 MED ORDER — PRONTOSAN WOUND IRRIGATION OPTIME
TOPICAL | Status: DC | PRN
  Administered 2022-10-24: 350 mL

## 2022-10-24 MED ORDER — OXYCODONE HCL 5 MG PO TABS
5.0000 mg | ORAL_TABLET | ORAL | Status: DC | PRN
  Administered 2022-10-25: 10 mg via ORAL
  Filled 2022-10-24: qty 2

## 2022-10-24 MED ORDER — MEPERIDINE HCL 25 MG/ML IJ SOLN: 6.2500 mg | INTRAMUSCULAR | Status: DC | PRN

## 2022-10-24 MED ORDER — OXYCODONE HCL 5 MG/5ML PO SOLN: 5.0000 mg | Freq: Once | ORAL | Status: DC | PRN

## 2022-10-24 MED ORDER — POLYETHYLENE GLYCOL 3350 17 G PO PACK
17.0000 g | PACK | Freq: Every day | ORAL | Status: DC
  Administered 2022-10-24 – 2022-10-26 (×3): 17 g via ORAL
  Filled 2022-10-24 (×3): qty 1

## 2022-10-24 MED ORDER — METHOCARBAMOL 500 MG PO TABS: 500.0000 mg | ORAL_TABLET | Freq: Four times a day (QID) | ORAL | Status: DC | PRN

## 2022-10-24 MED ORDER — PROPOFOL 500 MG/50ML IV EMUL
INTRAVENOUS | Status: DC | PRN
  Administered 2022-10-24: 45 ug/kg/min via INTRAVENOUS

## 2022-10-24 MED ORDER — LACTATED RINGERS IV SOLN: INTRAVENOUS | Status: DC

## 2022-10-24 MED ORDER — FERROUS SULFATE 325 (65 FE) MG PO TABS
325.0000 mg | ORAL_TABLET | Freq: Three times a day (TID) | ORAL | Status: DC
  Administered 2022-10-24 – 2022-10-26 (×7): 325 mg via ORAL
  Filled 2022-10-24 (×7): qty 1

## 2022-10-24 MED ORDER — ONDANSETRON HCL 4 MG PO TABS: 4.0000 mg | ORAL_TABLET | Freq: Four times a day (QID) | ORAL | Status: DC | PRN

## 2022-10-24 MED ORDER — EPHEDRINE SULFATE-NACL 50-0.9 MG/10ML-% IV SOSY
PREFILLED_SYRINGE | INTRAVENOUS | Status: DC | PRN
  Administered 2022-10-24 (×2): 10 mg via INTRAVENOUS

## 2022-10-24 MED ORDER — METOCLOPRAMIDE HCL 5 MG PO TABS: 5.0000 mg | ORAL_TABLET | Freq: Three times a day (TID) | ORAL | Status: DC | PRN

## 2022-10-24 MED ORDER — TRANEXAMIC ACID-NACL 1000-0.7 MG/100ML-% IV SOLN
INTRAVENOUS | Status: AC
  Filled 2022-10-24: qty 100

## 2022-10-24 MED ORDER — PROPOFOL 10 MG/ML IV BOLUS
INTRAVENOUS | Status: DC | PRN
  Administered 2022-10-24: 20 mg via INTRAVENOUS

## 2022-10-24 MED ORDER — ORAL CARE MOUTH RINSE: 15.0000 mL | Freq: Once | OROMUCOSAL | Status: AC

## 2022-10-24 MED ORDER — PHENYLEPHRINE 80 MCG/ML (10ML) SYRINGE FOR IV PUSH (FOR BLOOD PRESSURE SUPPORT)
PREFILLED_SYRINGE | INTRAVENOUS | Status: DC | PRN
  Administered 2022-10-24: 80 ug via INTRAVENOUS
  Administered 2022-10-24: 120 ug via INTRAVENOUS

## 2022-10-24 MED ORDER — BUPIVACAINE-MELOXICAM ER 400-12 MG/14ML IJ SOLN
INTRAMUSCULAR | Status: DC | PRN
  Administered 2022-10-24: 400 mg

## 2022-10-24 MED ORDER — INSULIN ASPART 100 UNIT/ML IJ SOLN
0.0000 [IU] | Freq: Three times a day (TID) | INTRAMUSCULAR | Status: DC
  Administered 2022-10-25: 3 [IU] via SUBCUTANEOUS
  Administered 2022-10-26: 2 [IU] via SUBCUTANEOUS

## 2022-10-24 MED ORDER — BUPIVACAINE-MELOXICAM ER 400-12 MG/14ML IJ SOLN
INTRAMUSCULAR | Status: AC
  Filled 2022-10-24: qty 1

## 2022-10-24 MED ORDER — ASPIRIN 81 MG PO CHEW
81.0000 mg | CHEWABLE_TABLET | Freq: Two times a day (BID) | ORAL | Status: DC
  Administered 2022-10-24 – 2022-10-26 (×4): 81 mg via ORAL
  Filled 2022-10-24 (×4): qty 1

## 2022-10-24 MED ORDER — DEXAMETHASONE SODIUM PHOSPHATE 10 MG/ML IJ SOLN
10.0000 mg | Freq: Once | INTRAMUSCULAR | Status: AC
  Administered 2022-10-25: 10 mg via INTRAVENOUS
  Filled 2022-10-24: qty 1

## 2022-10-24 MED ORDER — ONDANSETRON HCL 4 MG/2ML IJ SOLN
4.0000 mg | Freq: Four times a day (QID) | INTRAMUSCULAR | Status: DC | PRN
  Administered 2022-10-24: 4 mg via INTRAVENOUS
  Filled 2022-10-24: qty 2

## 2022-10-24 MED ORDER — TRANEXAMIC ACID-NACL 1000-0.7 MG/100ML-% IV SOLN
INTRAVENOUS | Status: DC | PRN
Start: 2022-10-24 — End: 2022-10-24
  Administered 2022-10-24: 1000 mg via INTRAVENOUS

## 2022-10-24 MED ORDER — MIDAZOLAM HCL 2 MG/2ML IJ SOLN
INTRAMUSCULAR | Status: AC
  Filled 2022-10-24: qty 2

## 2022-10-24 MED ORDER — MAGNESIUM CITRATE PO SOLN: 1.0000 | Freq: Once | ORAL | Status: DC | PRN

## 2022-10-24 MED ORDER — FENTANYL CITRATE (PF) 250 MCG/5ML IJ SOLN
INTRAMUSCULAR | Status: DC | PRN
  Administered 2022-10-24: 50 ug via INTRAVENOUS

## 2022-10-24 MED ORDER — PHENOL 1.4 % MT LIQD: 1.0000 | OROMUCOSAL | Status: DC | PRN

## 2022-10-24 MED ORDER — OXYCODONE HCL 5 MG PO TABS
10.0000 mg | ORAL_TABLET | ORAL | Status: DC | PRN
  Administered 2022-10-24: 15 mg via ORAL
  Administered 2022-10-25 – 2022-10-26 (×2): 10 mg via ORAL
  Filled 2022-10-24: qty 3
  Filled 2022-10-24 (×2): qty 2

## 2022-10-24 MED ORDER — 0.9 % SODIUM CHLORIDE (POUR BTL) OPTIME
TOPICAL | Status: DC | PRN
  Administered 2022-10-24: 1000 mL

## 2022-10-24 MED ORDER — ACETAMINOPHEN 500 MG PO TABS
1000.0000 mg | ORAL_TABLET | Freq: Four times a day (QID) | ORAL | Status: AC
  Administered 2022-10-24 – 2022-10-25 (×4): 1000 mg via ORAL
  Filled 2022-10-24 (×3): qty 2

## 2022-10-24 MED ORDER — CHLORHEXIDINE GLUCONATE 0.12 % MT SOLN
15.0000 mL | Freq: Once | OROMUCOSAL | Status: AC
  Administered 2022-10-24: 15 mL via OROMUCOSAL
  Filled 2022-10-24: qty 15

## 2022-10-24 MED ORDER — HYDROMORPHONE HCL 1 MG/ML IJ SOLN
0.5000 mg | INTRAMUSCULAR | Status: DC | PRN
  Administered 2022-10-24 – 2022-10-26 (×4): 1 mg via INTRAVENOUS
  Filled 2022-10-24 (×4): qty 1

## 2022-10-24 MED ORDER — CEFAZOLIN SODIUM-DEXTROSE 2-3 GM-%(50ML) IV SOLR
INTRAVENOUS | Status: DC | PRN
Start: 2022-10-24 — End: 2022-10-24
  Administered 2022-10-24: 2 g via INTRAVENOUS

## 2022-10-24 MED ORDER — INSULIN ASPART 100 UNIT/ML IJ SOLN: 0.0000 [IU] | INTRAMUSCULAR | Status: DC | PRN

## 2022-10-24 MED ORDER — ACETAMINOPHEN 325 MG PO TABS: 325.0000 mg | ORAL_TABLET | ORAL | Status: DC | PRN

## 2022-10-24 MED ORDER — METHOCARBAMOL 1000 MG/10ML IJ SOLN: 500.0000 mg | Freq: Four times a day (QID) | INTRAVENOUS | Status: DC | PRN

## 2022-10-24 MED ORDER — DOCUSATE SODIUM 100 MG PO CAPS
100.0000 mg | ORAL_CAPSULE | Freq: Two times a day (BID) | ORAL | Status: DC
  Administered 2022-10-24 – 2022-10-26 (×4): 100 mg via ORAL
  Filled 2022-10-24 (×4): qty 1

## 2022-10-24 MED ORDER — FENTANYL CITRATE (PF) 100 MCG/2ML IJ SOLN: 25.0000 ug | INTRAMUSCULAR | Status: DC | PRN

## 2022-10-24 MED ORDER — HYDRALAZINE HCL 20 MG/ML IJ SOLN
INTRAMUSCULAR | Status: AC
  Filled 2022-10-24: qty 1

## 2022-10-24 MED ORDER — ACETAMINOPHEN 160 MG/5ML PO SOLN: 325.0000 mg | ORAL | Status: DC | PRN

## 2022-10-24 MED ORDER — OXYCODONE HCL 5 MG PO TABS: 5.0000 mg | ORAL_TABLET | Freq: Once | ORAL | Status: DC | PRN

## 2022-10-24 MED ORDER — TRANEXAMIC ACID-NACL 1000-0.7 MG/100ML-% IV SOLN
1000.0000 mg | Freq: Once | INTRAVENOUS | Status: AC
  Administered 2022-10-24: 1000 mg via INTRAVENOUS
  Filled 2022-10-24: qty 100

## 2022-10-24 MED ORDER — ALUM & MAG HYDROXIDE-SIMETH 200-200-20 MG/5ML PO SUSP: 30.0000 mL | ORAL | Status: DC | PRN

## 2022-10-24 MED ORDER — PANTOPRAZOLE SODIUM 40 MG PO TBEC
40.0000 mg | DELAYED_RELEASE_TABLET | Freq: Every day | ORAL | Status: DC
  Administered 2022-10-24 – 2022-10-26 (×3): 40 mg via ORAL
  Filled 2022-10-24 (×3): qty 1

## 2022-10-24 MED ORDER — METOCLOPRAMIDE HCL 5 MG/ML IJ SOLN: 5.0000 mg | Freq: Three times a day (TID) | INTRAMUSCULAR | Status: DC | PRN

## 2022-10-24 MED ORDER — CEFAZOLIN SODIUM-DEXTROSE 2-4 GM/100ML-% IV SOLN
2.0000 g | Freq: Four times a day (QID) | INTRAVENOUS | Status: AC
  Administered 2022-10-24 (×2): 2 g via INTRAVENOUS
  Filled 2022-10-24 (×2): qty 100

## 2022-10-24 MED ORDER — LISINOPRIL 5 MG PO TABS
5.0000 mg | ORAL_TABLET | Freq: Every day | ORAL | Status: DC
  Administered 2022-10-25 – 2022-10-26 (×2): 5 mg via ORAL
  Filled 2022-10-24 (×2): qty 1

## 2022-10-24 MED ORDER — TRANEXAMIC ACID 1000 MG/10ML IV SOLN
INTRAVENOUS | Status: DC | PRN
  Administered 2022-10-24: 2000 mg via TOPICAL

## 2022-10-24 MED ORDER — DOXYCYCLINE HYCLATE 100 MG PO TABS
100.0000 mg | ORAL_TABLET | Freq: Two times a day (BID) | ORAL | Status: DC
  Administered 2022-10-25 – 2022-10-26 (×3): 100 mg via ORAL
  Filled 2022-10-24 (×3): qty 1

## 2022-10-24 MED ORDER — DAPAGLIFLOZIN PROPANEDIOL 10 MG PO TABS
10.0000 mg | ORAL_TABLET | Freq: Every day | ORAL | Status: DC
  Administered 2022-10-25 – 2022-10-26 (×2): 10 mg via ORAL
  Filled 2022-10-24 (×2): qty 1

## 2022-10-24 MED ORDER — CLOPIDOGREL BISULFATE 75 MG PO TABS
75.0000 mg | ORAL_TABLET | Freq: Every day | ORAL | Status: DC
  Administered 2022-10-25 – 2022-10-26 (×2): 75 mg via ORAL
  Filled 2022-10-24 (×2): qty 1

## 2022-10-24 MED ORDER — MENTHOL 3 MG MT LOZG: 1.0000 | LOZENGE | OROMUCOSAL | Status: DC | PRN

## 2022-10-24 MED ORDER — VANCOMYCIN HCL 1000 MG IV SOLR
INTRAVENOUS | Status: AC
  Filled 2022-10-24: qty 20

## 2022-10-24 SURGICAL SUPPLY — 74 items
ADH SKN CLS APL DERMABOND .7 (GAUZE/BANDAGES/DRESSINGS) ×1
AGENT HMST PWDR HDRLZ BVN CLGN (Miscellaneous) ×1 IMPLANT
BAG COUNTER SPONGE SURGICOUNT (BAG) ×1 IMPLANT
BAG DECANTER FOR FLEXI CONT (MISCELLANEOUS) ×1 IMPLANT
BAG SPNG CNTER NS LX DISP (BAG) ×1
BLADE SAG 18X100X1.27 (BLADE) ×1 IMPLANT
COLLAGEN CELLERATERX 1 GRAM (Miscellaneous) IMPLANT
COOLER ICEMAN CLASSIC (MISCELLANEOUS) IMPLANT
COVER PERINEAL POST (MISCELLANEOUS) ×1 IMPLANT
COVER SURGICAL LIGHT HANDLE (MISCELLANEOUS) ×1 IMPLANT
CUP SECTOR GRIPTON 50MM (Cup) IMPLANT
DERMABOND ADVANCED .7 DNX12 (GAUZE/BANDAGES/DRESSINGS) IMPLANT
DRAPE C-ARM 42X72 X-RAY (DRAPES) ×1 IMPLANT
DRAPE POUCH INSTRU U-SHP 10X18 (DRAPES) ×1 IMPLANT
DRAPE STERI IOBAN 125X83 (DRAPES) ×1 IMPLANT
DRAPE U-SHAPE 47X51 STRL (DRAPES) ×2 IMPLANT
DRSG AQUACEL AG ADV 3.5X10 (GAUZE/BANDAGES/DRESSINGS) ×1 IMPLANT
DURAPREP 26ML APPLICATOR (WOUND CARE) ×2 IMPLANT
ELECT BLADE 4.0 EZ CLEAN MEGAD (MISCELLANEOUS) ×1
ELECT REM PT RETURN 9FT ADLT (ELECTROSURGICAL) ×1
ELECTRODE BLDE 4.0 EZ CLN MEGD (MISCELLANEOUS) ×1 IMPLANT
ELECTRODE REM PT RTRN 9FT ADLT (ELECTROSURGICAL) ×1 IMPLANT
GLOVE BIOGEL PI IND STRL 7.0 (GLOVE) ×2 IMPLANT
GLOVE BIOGEL PI IND STRL 7.5 (GLOVE) ×5 IMPLANT
GLOVE ECLIPSE 7.0 STRL STRAW (GLOVE) ×2 IMPLANT
GLOVE SKINSENSE STRL SZ7.5 (GLOVE) ×1 IMPLANT
GLOVE SURG SYN 7.5 E (GLOVE) ×2
GLOVE SURG SYN 7.5 PF PI (GLOVE) ×2 IMPLANT
GLOVE SURG UNDER POLY LF SZ7 (GLOVE) ×3 IMPLANT
GLOVE SURG UNDER POLY LF SZ7.5 (GLOVE) ×2 IMPLANT
GOWN STRL REUS W/ TWL LRG LVL3 (GOWN DISPOSABLE) IMPLANT
GOWN STRL REUS W/ TWL XL LVL3 (GOWN DISPOSABLE) ×1 IMPLANT
GOWN STRL REUS W/TWL LRG LVL3 (GOWN DISPOSABLE)
GOWN STRL REUS W/TWL XL LVL3 (GOWN DISPOSABLE) ×1
GOWN STRL SURGICAL XL XLNG (GOWN DISPOSABLE) ×1 IMPLANT
GOWN TOGA ZIPPER T7+ PEEL AWAY (MISCELLANEOUS) ×2 IMPLANT
HANDPIECE INTERPULSE COAX TIP (DISPOSABLE) ×1
HEAD FEM STD 32X+5 STRL (Hips) IMPLANT
HOOD PEEL AWAY T7 (MISCELLANEOUS) ×1 IMPLANT
IV NS IRRIG 3000ML ARTHROMATIC (IV SOLUTION) ×1 IMPLANT
KIT BASIN OR (CUSTOM PROCEDURE TRAY) ×1 IMPLANT
LINER ACETABULAR 32X50 (Liner) IMPLANT
MARKER SKIN DUAL TIP RULER LAB (MISCELLANEOUS) ×1 IMPLANT
NDL SPNL 18GX3.5 QUINCKE PK (NEEDLE) ×1 IMPLANT
NEEDLE SPNL 18GX3.5 QUINCKE PK (NEEDLE) ×1
PACK TOTAL JOINT (CUSTOM PROCEDURE TRAY) ×1 IMPLANT
PACK UNIVERSAL I (CUSTOM PROCEDURE TRAY) ×1 IMPLANT
PAD COLD SHLDR WRAP-ON (PAD) IMPLANT
RETRACTOR WND ALEXIS 18 MED (MISCELLANEOUS) IMPLANT
RTRCTR WOUND ALEXIS 18CM MED (MISCELLANEOUS) ×1
SCREW 6.5MMX25MM (Screw) IMPLANT
SET HNDPC FAN SPRY TIP SCT (DISPOSABLE) ×1 IMPLANT
SOLUTION PRONTOSAN WOUND 350ML (IRRIGATION / IRRIGATOR) ×1 IMPLANT
STAPLER VISISTAT 35W (STAPLE) IMPLANT
STEM FEM ACTIS STD SZ4 (Stem) IMPLANT
SUT ETHIBOND 2 V 37 (SUTURE) ×1 IMPLANT
SUT ETHILON 2 0 FS 18 (SUTURE) IMPLANT
SUT VIC AB 0 CT1 27 (SUTURE) ×1
SUT VIC AB 0 CT1 27XBRD ANBCTR (SUTURE) ×1 IMPLANT
SUT VIC AB 1 CTX 36 (SUTURE) ×1
SUT VIC AB 1 CTX36XBRD ANBCTR (SUTURE) ×1 IMPLANT
SUT VIC AB 2-0 CT1 27 (SUTURE) ×2
SUT VIC AB 2-0 CT1 TAPERPNT 27 (SUTURE) ×2 IMPLANT
SYR 30ML LL (SYRINGE) IMPLANT
SYR 50ML LL SCALE MARK (SYRINGE) ×1 IMPLANT
TIP HIGH FLOW IRRIGATION COAX (MISCELLANEOUS) IMPLANT
TOWEL GREEN STERILE (TOWEL DISPOSABLE) ×1 IMPLANT
TRAY CATH INTERMITTENT SS 16FR (CATHETERS) IMPLANT
TRAY FOL W/BAG SLVR 16FR STRL (SET/KITS/TRAYS/PACK) IMPLANT
TRAY FOLEY W/BAG SLVR 16FR (SET/KITS/TRAYS/PACK)
TRAY FOLEY W/BAG SLVR 16FR LF (SET/KITS/TRAYS/PACK) ×1
TRAY FOLEY W/BAG SLVR 16FR ST (SET/KITS/TRAYS/PACK) IMPLANT
TUBE SUCT ARGYLE STRL (TUBING) ×1 IMPLANT
YANKAUER SUCT BULB TIP NO VENT (SUCTIONS) ×1 IMPLANT

## 2022-10-24 NOTE — Plan of Care (Signed)
  Problem: Education: Goal: Ability to describe self-care measures that may prevent or decrease complications (Diabetes Survival Skills Education) will improve Outcome: Progressing Goal: Individualized Educational Video(s) Outcome: Progressing   Problem: Coping: Goal: Ability to adjust to condition or change in health will improve Outcome: Progressing   Problem: Fluid Volume: Goal: Ability to maintain a balanced intake and output will improve Outcome: Progressing   Problem: Health Behavior/Discharge Planning: Goal: Ability to identify and utilize available resources and services will improve Outcome: Progressing Goal: Ability to manage health-related needs will improve Outcome: Progressing   Problem: Metabolic: Goal: Ability to maintain appropriate glucose levels will improve Outcome: Progressing   Problem: Nutritional: Goal: Maintenance of adequate nutrition will improve Outcome: Progressing Goal: Progress toward achieving an optimal weight will improve Outcome: Progressing   Problem: Skin Integrity: Goal: Risk for impaired skin integrity will decrease Outcome: Progressing   Problem: Tissue Perfusion: Goal: Adequacy of tissue perfusion will improve Outcome: Progressing   Problem: Education: Goal: Knowledge of the prescribed therapeutic regimen will improve Outcome: Progressing Goal: Understanding of discharge needs will improve Outcome: Progressing Goal: Individualized Educational Video(s) Outcome: Progressing   Problem: Activity: Goal: Ability to avoid complications of mobility impairment will improve Outcome: Progressing Goal: Ability to tolerate increased activity will improve Outcome: Progressing   Problem: Clinical Measurements: Goal: Postoperative complications will be avoided or minimized Outcome: Progressing   Problem: Pain Management: Goal: Pain level will decrease with appropriate interventions Outcome: Progressing   Problem: Skin  Integrity: Goal: Will show signs of wound healing Outcome: Progressing   Problem: Education: Goal: Knowledge of General Education information will improve Description: Including pain rating scale, medication(s)/side effects and non-pharmacologic comfort measures Outcome: Progressing   Problem: Health Behavior/Discharge Planning: Goal: Ability to manage health-related needs will improve Outcome: Progressing   Problem: Clinical Measurements: Goal: Ability to maintain clinical measurements within normal limits will improve Outcome: Progressing Goal: Will remain free from infection Outcome: Progressing Goal: Diagnostic test results will improve Outcome: Progressing Goal: Respiratory complications will improve Outcome: Progressing Goal: Cardiovascular complication will be avoided Outcome: Progressing   Problem: Activity: Goal: Risk for activity intolerance will decrease Outcome: Progressing   Problem: Nutrition: Goal: Adequate nutrition will be maintained Outcome: Progressing   Problem: Coping: Goal: Level of anxiety will decrease Outcome: Progressing   Problem: Elimination: Goal: Will not experience complications related to bowel motility Outcome: Progressing Goal: Will not experience complications related to urinary retention Outcome: Progressing   Problem: Pain Managment: Goal: General experience of comfort will improve Outcome: Progressing   Problem: Safety: Goal: Ability to remain free from injury will improve Outcome: Progressing   Problem: Skin Integrity: Goal: Risk for impaired skin integrity will decrease Outcome: Progressing   

## 2022-10-24 NOTE — Transfer of Care (Signed)
Immediate Anesthesia Transfer of Care Note  Patient: Kelsey Conway  Procedure(s) Performed: RIGHT TOTAL HIP ARTHROPLASTY ANTERIOR APPROACH (Right: Hip)  Patient Location: PACU  Anesthesia Type:Spinal  Level of Consciousness: awake, alert , and oriented  Airway & Oxygen Therapy: Patient Spontanous Breathing and Patient connected to face mask oxygen  Post-op Assessment: Report given to RN and Post -op Vital signs reviewed and stable  Post vital signs: Reviewed and stable  Last Vitals:  Vitals Value Taken Time  BP 108/62 10/24/22 1428  Temp    Pulse 68 10/24/22 1430  Resp 16 10/24/22 1430  SpO2 100 % 10/24/22 1430  Vitals shown include unfiled device data.  Last Pain:  Vitals:   10/24/22 1050  TempSrc:   PainSc: 10-Worst pain ever      Patients Stated Pain Goal: 4 (10/24/22 1050)  Complications: No notable events documented.

## 2022-10-24 NOTE — Op Note (Signed)
RIGHT TOTAL HIP ARTHROPLASTY ANTERIOR APPROACH  Procedure Note Kelsey Conway   [id number]  Pre-op Diagnosis: right hip osteoarthritis     Post-op Diagnosis: same  Operative Findings Complete loss of articular cartilage   Operative Procedures  1. Total hip replacement; Right hip; uncemented [id number]   Surgeon: Gershon Mussel, M.D.  Assist: Oneal Grout, PA-C   Anesthesia: spinal  Prosthesis: Depuy Acetabulum: Pinnacle 50 mm Femur: Actis 4 STD Head: 32 mm size: +5 Liner: +0 Bearing Type: metal/poly  Total Hip Arthroplasty (Anterior Approach) Op Note:  After informed consent was obtained and the operative extremity marked in the holding area, the patient was brought back to the operating room and placed supine on the HANA table. Next, the operative extremity was prepped and draped in normal sterile fashion. Surgical timeout occurred verifying patient identification, surgical site, surgical procedure and administration of antibiotics.  A 10 cm longitudinal incision was made starting from 2 fingerbreadths lateral and inferior to the ASIS towards the lateral aspect of the patella.  A Hueter approach to the hip was performed, using the interval between tensor fascia lata and sartorius.  Dissection was carried bluntly down onto the anterior hip capsule. The lateral femoral circumflex vessels were identified and coagulated. A capsulotomy was performed and the capsular flaps tagged for later repair.  The neck osteotomy was performed. The femoral head was removed which showed severe wear, the acetabular rim was cleared of soft tissue and osteophytes and attention was turned to reaming the acetabulum.  Sequential reaming was performed under fluoroscopic guidance down to the floor of the cotyloid fossa. We reamed to a size 49 mm, and then impacted the acetabular shell. A 25 mm cancellous screw was placed to secure the shell.  The liner was then placed after irrigation and attention turned to  the femur.  After placing the femoral hook, the leg was taken to externally rotated, extended and adducted position taking care to perform soft tissue releases to allow for adequate mobilization of the femur. Soft tissue was cleared from the shoulder of the greater trochanter and the hook elevator used to improve exposure of the proximal femur. Sequential broaching performed up to a size 4. Trial neck and head were placed. The leg was brought back up to neutral and the construct reduced.  Antibiotic irrigation was placed in the surgical wound.  The position and sizing of components, offset and leg lengths were checked using fluoroscopy. Stability of the construct was checked in extension and external rotation without any subluxation, shuck or impingement of prosthesis. We dislocated the prosthesis, dropped the leg back into position, removed trial components, and irrigated copiously. The final stem and head was then placed, the leg brought back up, the system reduced and fluoroscopy used to verify positioning.  We irrigated, obtained hemostasis and closed the capsule using #2 ethibond suture.  One gram of vancomycin powder was placed in the surgical bed.   One gram of topical tranexamic acid was injected into the joint.  The fascia was closed with #1 vicryl plus, the deep fat layer was closed with 0 vicryl, the subcutaneous layers closed with 2.0 Vicryl Plus and the skin closed with 2.0 nylon and dermabond. A sterile dressing was applied. The patient was awakened in the operating room and taken to recovery in stable condition.  All sponge, needle, and instrument counts were correct at the end of the case.   Tessa Lerner, my PA, was a medical necessity for opening, closing, limb positioning,  retracting, exposing, and overall facilitation and timely completion of the surgery.  Position: supine  Complications: see description of procedure.  Time Out: performed   Drains/Packing: none  Estimated blood  loss: see anesthesia record  Returned to Recovery Room: in good condition.   Antibiotics: yes   Mechanical VTE (DVT) Prophylaxis: sequential compression devices, TED thigh-high  Chemical VTE (DVT) Prophylaxis: aspirin POD 0, plavix POD 1   Fluid Replacement: see anesthesia record  Specimens Removed: 1 to pathology   Sponge and Instrument Count Correct? yes   PACU: portable radiograph - low AP   Plan/RTC: Return in 2 weeks for staple removal. Weight Bearing/Load Lower Extremity: full  Hip precautions: none Suture Removal: 2 weeks   N. Glee Arvin, MD [facility] 1:57 PM   Implant Name Type Inv. Item Serial No. Manufacturer Lot No. LRB No. Used Action  LINER ACETABULAR 32X50 - [id number] Liner LINER ACETABULAR 32X50  [employer]PU[employer] [id number] Right 1 Implanted  CUP SECTOR GRIPTON - [id number] Cup CUP SECTOR GRIPTON  DEPU[employer] [id number] Right 1 Implanted  SCREW 6.5MMX25MM - [id number] Screw SCREW 6.5MMX25MM  DEPU[employer] [id number] Right 1 Implanted  STEM FEM ACTIS STD SZ4 - [id number] Stem STEM FEM ACTIS STD SZ4  DEPU[employer]TH[employer] [id number] Right 1 Implanted  HEAD FEM STD 32X+5 STRL - [id number] Hips HEAD FEM STD 32X+5 STRL  [employer] [id number] Right 1 Implanted  AGENT HMST PWDR HDRLZ BVN CLGN - [id number] Miscellaneous AGENT HMST PWDR HDRLZ BVN CLGN  [employer] [id number] Right 1 Implanted

## 2022-10-24 NOTE — H&P (Signed)
PREOPERATIVE H&P  Chief Complaint: right hip osteoarthritis  HPI: Kelsey Conway is a 77 y.o. female who presents for surgical treatment of right hip osteoarthritis.  She denies any changes in medical history.  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   WISDOM TOOTH EXTRACTION     Social History   Socioeconomic History   Marital status: Married    Spouse name: Not on file   Number of children: Not on file   Years of education: Not on file   Highest education level: Not on file  Occupational History   Not on file  Tobacco Use   Smoking status: Never   Smokeless tobacco: Never  Vaping Use   Vaping status: Never Used  Substance and Sexual Activity   Alcohol use: No   Drug use: No   Sexual activity: Yes    Birth control/protection: Surgical    Comment: Hysterectomy  Other Topics Concern   Not on file  Social History Narrative   Not on file   Social Determinants of Health   Financial Resource Strain: Not on file  Food Insecurity: Not on file  Transportation Needs: Not on file  Physical Activity: Not on file  Stress: Not on file  Social Connections: Unknown (02/24/2022)   Received from [facility]   Social Network    Social Network: Not on file   Family History  Problem Relation Age of Onset   Diabetes Mother    Hypertension Mother    Hyperlipidemia Mother    Diabetes Sister    Hypertension Sister    Diabetes Brother    Allergies  Allergen Reactions   Latex Rash    Powder latex   Prior to Admission medications   Medication Sig Start Date End Date Taking? Authorizing Provider  aspirin EC 81 MG tablet Take 1 tablet (81 mg total) by mouth  daily. To be taken after surgery to prevent blood clots 10/19/22 10/19/23  Cristie Hem, PA-C  clopidogrel (PLAVIX) 75 MG tablet Take 75 mg by mouth daily. 08/04/22  Yes [provider]  dapagliflozin propanediol (FARXIGA) 10 MG TABS tablet Take 10 mg by mouth daily.   Yes [provider]  docusate sodium (COLACE) 100 MG capsule Take 1 capsule (100 mg total) by mouth daily as needed. 10/19/22 10/19/23  Cristie Hem, PA-C  doxycycline (VIBRAMYCIN) 100 MG capsule Take 1 capsule (100 mg total) by mouth 2 (two) times daily. To be taken after surgery 10/19/22   Cristie Hem, PA-C  febuxostat (ULORIC) 40 MG tablet Take 40 mg by mouth daily. 03/05/22  Yes [provider]  lisinopril (ZESTRIL) 5 MG tablet Take 5 mg by mouth daily. 12/21/20  Yes [provider]  LUMIGAN 0.01 % SOLN Place 1 drop into both eyes at bedtime. 03/04/21  Yes [provider]  methocarbamol (ROBAXIN-750) 750 MG tablet Take 1 tablet (750 mg total) by mouth 2 (two) times daily as needed for muscle spasms. 10/19/22  Yes Cristie Hem, PA-C  Multiple Vitamins-Minerals (MULTIVITAMIN ADULT) CHEW Chew 2  each by mouth daily.   Yes [provider]  ondansetron (ZOFRAN) 4 MG tablet Take 1 tablet (4 mg total) by mouth every 8 (eight) hours as needed for nausea or vomiting. 10/19/22   Cristie Hem, PA-C  oxyCODONE-acetaminophen (PERCOCET) 5-325 MG tablet Take 1-2 tablets by mouth every 6 (six) hours as needed. 10/19/22   Cristie Hem, PA-C  rosuvastatin (CRESTOR) 10 MG tablet Take 20 mg by mouth daily. 08/04/22  Yes [provider]  ACCU-CHEK AVIVA PLUS test strip USE AS DIRECTED 04/24/18   [place], [name], MD  Accu-Chek Softclix Lancets lancets USE TO TEST BLOOD SUGAR AS DIRECTED TWICE DAILY 04/24/18   [place], [name], MD  aspirin EC 81 MG tablet Take 1 tablet (81 mg total) by mouth 2 (two) times daily. To be taken after surgery to prevent blood clots 08/15/22 08/15/23   Cristie Hem, PA-C  cefadroxil (DURICEF) 500 MG capsule Take 1 capsule (500 mg total) by mouth 2 (two) times daily. 08/15/22   Cristie Hem, PA-C  docusate sodium (COLACE) 100 MG capsule Take 1 capsule (100 mg total) by mouth daily as needed. 08/15/22 08/15/23  Cristie Hem, PA-C  methocarbamol (ROBAXIN-750) 750 MG tablet Take 1 tablet (750 mg total) by mouth 2 (two) times daily as needed for muscle spasms. 08/15/22   Cristie Hem, PA-C  ondansetron (ZOFRAN) 4 MG tablet Take 1 tablet (4 mg total) by mouth every 8 (eight) hours as needed for nausea or vomiting. 08/15/22   Cristie Hem, PA-C  oxyCODONE-acetaminophen (PERCOCET) 10-325 MG tablet Take 1 tablet by mouth 4 (four) times daily as needed for pain. 03/13/21   [provider]  oxyCODONE-acetaminophen (PERCOCET) 5-325 MG tablet Take 1-2 tablets by mouth every 6 (six) hours as needed. To be taken after surgery 08/15/22   Cristie Hem, PA-C  traMADol (ULTRAM) 50 MG tablet Take 1-2 tablets (50-100 mg total) by mouth 3 (three) times daily as needed. 08/04/22   Cristie Hem, PA-C     Positive ROS: All other systems have been reviewed and were otherwise negative with the exception of those mentioned in the HPI and as above.  Physical Exam: General: Alert, no acute distress Cardiovascular: No pedal edema Respiratory: No cyanosis, no use of accessory musculature GI: abdomen soft Skin: No lesions in the area of chief complaint Neurologic: Sensation intact distally Psychiatric: Patient is competent for consent with normal mood and affect Lymphatic: no lymphedema  MUSCULOSKELETAL: exam stable  Assessment: right hip osteoarthritis  Plan: Plan for Procedure(s): RIGHT TOTAL HIP ARTHROPLASTY ANTERIOR APPROACH  The risks benefits and alternatives were discussed with the patient including but not limited to the risks of nonoperative treatment, versus surgical intervention including infection, bleeding, nerve injury,   blood clots, cardiopulmonary complications, morbidity, mortality, among others, and they were willing to proceed.   Glee Arvin, MD 10/24/2022 10:56 AM

## 2022-10-24 NOTE — Evaluation (Signed)
Physical Therapy Evaluation Patient Details Name: Kelsey Conway MRN: [id number] DOB: October 06, 1945 Today's Date: 10/24/2022  History of Present Illness  77 y.o. female presents to [facility] hospital on 10/24/2022 for elective R THA. PMH includes HTN, insomnia, DMII, HLD, ataxia, spinal stenosis.  Clinical Impression  Pt presents to PT with deficits in functional mobility, gait, balance, strength, power. Pt is able to ambulate a short distance with support of RW, however ambulation is limited by nausea and dizziness when up. PT assists pt back to bed and provides education on surgical hip exercise packet. Pt will benefit from frequent mobilization with staff assistance. PT will follow up tomorrow for further gait and mobility training.        If plan is discharge home, recommend the following: A little help with walking and/or transfers;A little help with bathing/dressing/bathroom;Assistance with cooking/housework;Assist for transportation;Help with stairs or ramp for entrance   Can travel by private vehicle        Equipment Recommendations Rolling walker (2 wheels);BSC/3in1  Recommendations for Other Services       Functional Status Assessment Patient has had a recent decline in their functional status and demonstrates the ability to make significant improvements in function in a reasonable and predictable amount of time.     Precautions / Restrictions Precautions Precautions: Fall Precaution Comments: direct anterior THA Restrictions Weight Bearing Restrictions: Yes RLE Weight Bearing: Weight bearing as tolerated      Mobility  Bed Mobility Overal bed mobility: Needs Assistance Bed Mobility: Supine to Sit, Sit to Supine     Supine to sit: Min assist Sit to supine: Min assist   General bed mobility comments: assist for RLE    Transfers Overall transfer level: Needs assistance Equipment used: Rolling walker (2 wheels) Transfers: Sit to/from Stand Sit to Stand: Contact guard  assist                Ambulation/Gait Ambulation/Gait assistance: Contact guard assist Gait Distance (Feet): 8 Feet Assistive device: Rolling walker (2 wheels) Gait Pattern/deviations: Step-to pattern Gait velocity: reduced Gait velocity interpretation: <1.31 ft/sec, indicative of household ambulator   General Gait Details: slowed step-to gait, ambulation distance limited by reports of dizziness and nausea  Stairs            Wheelchair Mobility     Tilt Bed    Modified Rankin (Stroke Patients Only)       Balance Overall balance assessment: Needs assistance Sitting-balance support: No upper extremity supported, Feet supported Sitting balance-Leahy Scale: Good     Standing balance support: Single extremity supported, Bilateral upper extremity supported, Reliant on assistive device for balance Standing balance-Leahy Scale: Poor                               Pertinent Vitals/Pain Pain Assessment Pain Assessment: 0-10 Pain Score: 9  Pain Location: R hip Pain Descriptors / Indicators: Sore Pain Intervention(s): Patient requesting pain meds-RN notified    Home Living Family/patient expects to be discharged to:: Private residence Living Arrangements: Spouse/significant other Available Help at Discharge: Family;Available 24 hours/day Type of Home: House Home Access: Stairs to enter Entrance Stairs-Rails: Right Entrance Stairs-Number of Steps: 3   Home Layout: One level Home Equipment: Cane - single point      Prior Function Prior Level of Function : Independent/Modified Independent             Mobility Comments: ambulatory with [facility]  Extremity/Trunk Assessment   Upper Extremity Assessment Upper Extremity Assessment: Overall WFL for tasks assessed    Lower Extremity Assessment Lower Extremity Assessment: RLE deficits/detail RLE Deficits / Details: generalized post-op weakness RLE Sensation: decreased light touch     Cervical / Trunk Assessment Cervical / Trunk Assessment: Normal  Communication   Communication Communication: No apparent difficulties Cueing Techniques: Verbal cues  Cognition Arousal: Alert Behavior During Therapy: WFL for tasks assessed/performed Overall Cognitive Status: Within Functional Limits for tasks assessed                                          General Comments General comments (skin integrity, edema, etc.): VSS on RA    Exercises Other Exercises Other Exercises: PT provides education on surgical hip exercise packet   Assessment/Plan    PT Assessment Patient needs continued PT services  PT Problem List Decreased strength;Decreased activity tolerance;Decreased balance;Decreased mobility;Decreased knowledge of use of DME;Pain       PT Treatment Interventions DME instruction;Gait training;Stair training;Functional mobility training;Therapeutic activities;Therapeutic exercise;Balance training;Neuromuscular re-education;Patient/family education    PT Goals (Current goals can be found in the Care Plan section)  Acute Rehab PT Goals Patient Stated Goal: to return to independence PT Goal Formulation: With patient Time For Goal Achievement: 10/28/22 Potential to Achieve Goals: Good    Frequency Min 1X/week     Co-evaluation               AM-PAC PT "6 Clicks" Mobility  Outcome Measure Help needed turning from your back to your side while in a flat bed without using bedrails?: A Little Help needed moving from lying on your back to sitting on the side of a flat bed without using bedrails?: A Little Help needed moving to and from a bed to a chair (including a wheelchair)?: A Little Help needed standing up from a chair using your arms (e.g., wheelchair or bedside chair)?: A Little Help needed to walk in hospital room?: Total Help needed climbing 3-5 steps with a railing? : Total 6 Click Score: 14    End of Session Equipment Utilized  During Treatment: Gait belt Activity Tolerance: Treatment limited secondary to medical complications (Comment) (dizziness, nausea) Patient left: in bed;with call bell/phone within reach Nurse Communication: Mobility status PT Visit Diagnosis: Other abnormalities of gait and mobility (R26.89);Muscle weakness (generalized) (M62.81);Pain Pain - Right/Left: Right Pain - part of body: Hip    Time: 1720-1738 PT Time Calculation (min) (ACUTE ONLY): 18 min   Charges:   PT Evaluation $PT Eval Low Complexity: 1 Low   PT General Charges $$ ACUTE PT VISIT: 1 Visit         Arlyss Gandy, PT, DPT Acute Rehabilitation Office [phone]   Arlyss Gandy 10/24/2022, 5:48 PM

## 2022-10-24 NOTE — Discharge Instructions (Signed)

## 2022-10-25 ENCOUNTER — Encounter (HOSPITAL_COMMUNITY): Payer: Self-pay | Admitting: Orthopaedic Surgery

## 2022-10-25 DIAGNOSIS — M1611 Unilateral primary osteoarthritis, right hip: Secondary | ICD-10-CM | POA: Diagnosis not present

## 2022-10-25 LAB — GLUCOSE, CAPILLARY
Glucose-Capillary: 102 mg/dL — ABNORMAL HIGH (ref 70–99)
Glucose-Capillary: 107 mg/dL — ABNORMAL HIGH (ref 70–99)
Glucose-Capillary: 188 mg/dL — ABNORMAL HIGH (ref 70–99)
Glucose-Capillary: 144 mg/dL — ABNORMAL HIGH (ref 70–99)

## 2022-10-25 NOTE — Progress Notes (Signed)
Mobility Specialist: Progress Note   10/25/22 1555  Mobility  Activity Ambulated with assistance in hallway  Level of Assistance Standby assist, set-up cues, supervision of patient - no hands on  Assistive Device Front wheel walker  Distance Ambulated (ft) 300 ft  RLE Weight Bearing WBAT  Activity Response Tolerated well  Mobility Referral Yes  $Mobility charge 1 Mobility  Mobility Specialist Start Time (ACUTE ONLY) 1407  Mobility Specialist Stop Time (ACUTE ONLY) 1422  Mobility Specialist Time Calculation (min) (ACUTE ONLY) 15 min    Received pt in bed having no complaints and agreeable to mobility. Pt was asymptomatic throughout ambulation and returned to room w/o fault. Left in bed w/ call bell in reach and all needs met.   Maurene Capes Mobility Specialist Please contact via SecureChat or Rehab office at [phone]

## 2022-10-25 NOTE — Progress Notes (Signed)
Physical Therapy Treatment Patient Details Name: Kelsey Conway MRN: [id number] DOB: [birth year] Today's Date: 10/25/2022   History of Present Illness 77 y.o. female presents to [facility] hospital on 10/24/2022 for elective R THA. PMH includes HTN, insomnia, DMII, HLD, ataxia, spinal stenosis.    PT Comments  Pt tolerated treatment well today. Pt was able to progress ambulation today with RW at supervision level. No change in DC/DME recs at this time. Patient needs to practice stairs next session.      If plan is discharge home, recommend the following: A little help with walking and/or transfers;A little help with bathing/dressing/bathroom;Assistance with cooking/housework;Assist for transportation;Help with stairs or ramp for entrance   Can travel by private vehicle        Equipment Recommendations  Rolling walker (2 wheels);BSC/3in1    Recommendations for Other Services       Precautions / Restrictions Precautions Precautions: Fall Precaution Comments: direct anterior THA Restrictions Weight Bearing Restrictions: Yes RLE Weight Bearing: Weight bearing as tolerated     Mobility  Bed Mobility Overal bed mobility: Needs Assistance Bed Mobility: Supine to Sit, Sit to Supine     Supine to sit: Contact guard Sit to supine: Contact guard assist   General bed mobility comments: pt cues for using LLE to assist with RLE.    Transfers Overall transfer level: Needs assistance Equipment used: Rolling walker (2 wheels) Transfers: Sit to/from Stand Sit to Stand: Supervision           General transfer comment: Cues for hand placement    Ambulation/Gait Ambulation/Gait assistance: Supervision Gait Distance (Feet): 120 Feet Assistive device: Rolling walker (2 wheels) Gait Pattern/deviations: Decreased stride length, Step-through pattern Gait velocity: reduced     General Gait Details: Pt with slow steady gait. No LOB noted however pt did report slight dizziness upon returning to  rroom.   Stairs             Wheelchair Mobility     Tilt Bed    Modified Rankin (Stroke Patients Only)       Balance Overall balance assessment: Needs assistance Sitting-balance support: No upper extremity supported, Feet supported Sitting balance-Leahy Scale: Good     Standing balance support: Single extremity supported, Bilateral upper extremity supported, Reliant on assistive device for balance Standing balance-Leahy Scale: Poor Standing balance comment: Reliant on RW                            Cognition Arousal: Alert Behavior During Therapy: WFL for tasks assessed/performed Overall Cognitive Status: Within Functional Limits for tasks assessed                                          Exercises      General Comments General comments (skin integrity, edema, etc.): VSS      Pertinent Vitals/Pain Pain Assessment Pain Assessment: Faces Faces Pain Scale: Hurts little more Pain Location: R hip Pain Descriptors / Indicators: Sore Pain Intervention(s): Monitored during session, Premedicated before session    Home Living                          Prior Function            PT Goals (current goals can now be found in the care plan section) Progress towards PT goals:  Progressing toward goals    Frequency    Min 1X/week      PT Plan      Co-evaluation              AM-PAC PT "6 Clicks" Mobility   Outcome Measure  Help needed turning from your back to your side while in a flat bed without using bedrails?: A Little Help needed moving from lying on your back to sitting on the side of a flat bed without using bedrails?: A Little Help needed moving to and from a bed to a chair (including a wheelchair)?: A Little Help needed standing up from a chair using your arms (e.g., wheelchair or bedside chair)?: A Little Help needed to walk in hospital room?: A Little Help needed climbing 3-5 steps with a railing? : A  Lot 6 Click Score: 17    End of Session Equipment Utilized During Treatment: Gait belt Activity Tolerance: Patient tolerated treatment well Patient left: in bed;with call bell/phone within reach;with family/visitor present Nurse Communication: Mobility status PT Visit Diagnosis: Other abnormalities of gait and mobility (R26.89);Muscle weakness (generalized) (M62.81);Pain Pain - Right/Left: Right Pain - part of body: Hip     Time: 1610-9604 PT Time Calculation (min) (ACUTE ONLY): 10 min  Charges:    $Gait Training: 8-22 mins PT General Charges $$ ACUTE PT VISIT: 1 Visit                     Shela Nevin, PT, DPT Acute Rehab Services [phone]    [name] 10/25/2022, 12:20 PM

## 2022-10-25 NOTE — Progress Notes (Signed)
Physical Therapy Treatment Patient Details Name: Kelsey Conway MRN: [id number] DOB: Jun 07, 1945 Today's Date: 10/25/2022   History of Present Illness 77 y.o. female presents to [facility] hospital on 10/24/2022 for elective R THA. PMH includes HTN, insomnia, DMII, HLD, ataxia, spinal stenosis.    PT Comments  Pt tolerated treatment well today. Pt able to navigate stairs at supervision level. Pt mobility status is adequate for DC home. No change in DC/DME recs at this time. PT will continue to follow.     If plan is discharge home, recommend the following: A little help with walking and/or transfers;A little help with bathing/dressing/bathroom;Assistance with cooking/housework;Assist for transportation;Help with stairs or ramp for entrance   Can travel by private vehicle        Equipment Recommendations  Rolling walker (2 wheels);BSC/3in1    Recommendations for Other Services       Precautions / Restrictions Precautions Precautions: Fall Precaution Comments: direct anterior THA Restrictions Weight Bearing Restrictions: Yes RLE Weight Bearing: Weight bearing as tolerated     Mobility  Bed Mobility Overal bed mobility: Needs Assistance Bed Mobility: Supine to Sit, Sit to Supine     Supine to sit: Supervision Sit to supine: Contact guard assist   General bed mobility comments: pt cues for using LLE to assist with RLE.    Transfers Overall transfer level: Needs assistance Equipment used: Rolling walker (2 wheels) Transfers: Sit to/from Stand Sit to Stand: Supervision           General transfer comment: Cues for hand placement    Ambulation/Gait Ambulation/Gait assistance: Supervision Gait Distance (Feet): 100 Feet Assistive device: Rolling walker (2 wheels) Gait Pattern/deviations: Decreased stride length, Step-through pattern Gait velocity: reduced     General Gait Details: Pt with slow steady gait. No LOB noted however pt did report slight dizziness upon returning to  rroom.   Stairs Stairs: Yes Stairs assistance: Supervision Stair Management: One rail Right, Step to pattern, Forwards Number of Stairs: 3 General stair comments: Cues for sequencing. No LOB noted.   Wheelchair Mobility     Tilt Bed    Modified Rankin (Stroke Patients Only)       Balance Overall balance assessment: Needs assistance Sitting-balance support: No upper extremity supported, Feet supported Sitting balance-Leahy Scale: Good     Standing balance support: Single extremity supported, Bilateral upper extremity supported, Reliant on assistive device for balance Standing balance-Leahy Scale: Poor Standing balance comment: Reliant on RW                            Cognition Arousal: Alert Behavior During Therapy: WFL for tasks assessed/performed Overall Cognitive Status: Within Functional Limits for tasks assessed                                          Exercises      General Comments General comments (skin integrity, edema, etc.): VSS      Pertinent Vitals/Pain Pain Assessment Pain Assessment: Faces Faces Pain Scale: Hurts little more Pain Location: R hip Pain Descriptors / Indicators: Sore Pain Intervention(s): Monitored during session, Premedicated before session    Home Living                          Prior Function  PT Goals (current goals can now be found in the care plan section) Progress towards PT goals: Progressing toward goals    Frequency    Min 1X/week      PT Plan      Co-evaluation              AM-PAC PT "6 Clicks" Mobility   Outcome Measure  Help needed turning from your back to your side while in a flat bed without using bedrails?: A Little Help needed moving from lying on your back to sitting on the side of a flat bed without using bedrails?: A Little Help needed moving to and from a bed to a chair (including a wheelchair)?: A Little Help needed standing up from  a chair using your arms (e.g., wheelchair or bedside chair)?: A Little Help needed to walk in hospital room?: A Little Help needed climbing 3-5 steps with a railing? : A Lot 6 Click Score: 17    End of Session Equipment Utilized During Treatment: Gait belt Activity Tolerance: Patient tolerated treatment well Patient left: in bed;with call bell/phone within reach;with family/visitor present Nurse Communication: Mobility status PT Visit Diagnosis: Other abnormalities of gait and mobility (R26.89);Muscle weakness (generalized) (M62.81);Pain Pain - Right/Left: Right Pain - part of body: Hip     Time: 1610-9604 PT Time Calculation (min) (ACUTE ONLY): 11 min  Charges:    $Gait Training: 8-22 mins PT General Charges $$ ACUTE PT VISIT: 1 Visit                     Shela Nevin, PT, DPT Acute Rehab Services [phone]    Kelsey Conway 10/25/2022, 2:51 PM

## 2022-10-25 NOTE — Care Management Obs Status (Signed)
MEDICARE OBSERVATION STATUS NOTIFICATION   Patient Details  Name: Kelsey Conway MRN: [id number] Date of Birth: 12-Jan-1946   Medicare Observation Status Notification Given:  Yes    Lawerance Sabal, RN 10/25/2022, 10:14 AM

## 2022-10-25 NOTE — Anesthesia Postprocedure Evaluation (Signed)
Anesthesia Post Note  Patient: Kelsey Conway  Procedure(s) Performed: RIGHT TOTAL HIP ARTHROPLASTY ANTERIOR APPROACH (Right: Hip)     Patient location during evaluation: PACU Anesthesia Type: MAC Level of consciousness: awake and alert Pain management: pain level controlled Vital Signs Assessment: post-procedure vital signs reviewed and stable Respiratory status: spontaneous breathing and respiratory function stable Cardiovascular status: blood pressure returned to baseline and stable Postop Assessment: spinal receding Anesthetic complications: no   No notable events documented.  Last Vitals:  Vitals:   10/25/22 1519 10/25/22 2047  BP: (!) 164/77 (!) 174/82  Pulse: 84 83  Resp: 18 16  Temp: 36.8 C 36.8 C  SpO2: 98% 98%    Last Pain:  Vitals:   10/25/22 2047  TempSrc: Oral  PainSc:                  Kennieth Rad

## 2022-10-25 NOTE — Progress Notes (Signed)
Subjective: 1 Day Post-Op Procedure(s) (LRB): RIGHT TOTAL HIP ARTHROPLASTY ANTERIOR APPROACH (Right) Patient reports pain as mild.    Objective: Vital signs in last 24 hours: Temp:  [97.5 F (36.4 C)-98.5 F (36.9 C)] 98.3 F (36.8 C) (10/01 0802) Pulse Rate:  [53-74] 69 (10/01 0802) Resp:  [12-20] 17 (10/01 0802) BP: (108-179)/(54-72) 125/54 (10/01 0802) SpO2:  [94 %-100 %] 98 % (10/01 0802)  Intake/Output from previous day: 09/30 0701 - 10/01 0700 In: 2033.7 [I.V.:1633.7; IV Piggyback:400] Out: 1175 [Urine:975; Blood:200] Intake/Output this shift: No intake/output data recorded.  No results for input(s): "HGB" in the last 72 hours. No results for input(s): "WBC", "RBC", "HCT", "PLT" in the last 72 hours. No results for input(s): "NA", "K", "CL", "CO2", "BUN", "CREATININE", "GLUCOSE", "CALCIUM" in the last 72 hours. No results for input(s): "LABPT", "INR" in the last 72 hours.  Neurologically intact Neurovascular intact Sensation intact distally Intact pulses distally Dorsiflexion/Plantar flexion intact Incision: dressing C/D/I No cellulitis present Compartment soft   Assessment/Plan: 1 Day Post-Op Procedure(s) (LRB): RIGHT TOTAL HIP ARTHROPLASTY ANTERIOR APPROACH (Right) Advance diet Up with therapy D/C IV fluids Plan for discharge tomorrow.  Patient will benefit from an extra night in order to progress with PT prior to safely being d/c home.  You have my permission to change this to inpatient status WBAT RLE       Cristie Hem 10/25/2022, 10:24 AM

## 2022-10-26 DIAGNOSIS — M1611 Unilateral primary osteoarthritis, right hip: Secondary | ICD-10-CM | POA: Diagnosis not present

## 2022-10-26 LAB — GLUCOSE, CAPILLARY
Glucose-Capillary: 113 mg/dL — ABNORMAL HIGH (ref 70–99)
Glucose-Capillary: 143 mg/dL — ABNORMAL HIGH (ref 70–99)
Glucose-Capillary: 109 mg/dL — ABNORMAL HIGH (ref 70–99)

## 2022-10-26 MED ORDER — HYDRALAZINE HCL 20 MG/ML IJ SOLN: 20.0000 mg | Freq: Four times a day (QID) | INTRAMUSCULAR | Status: DC | PRN

## 2022-10-26 MED ORDER — SODIUM CHLORIDE 0.9 % IV BOLUS
1000.0000 mL | Freq: Once | INTRAVENOUS | Status: AC
  Administered 2022-10-26: 1000 mL via INTRAVENOUS

## 2022-10-26 NOTE — Progress Notes (Addendum)
Subjective: 2 Days Post-Op Procedure(s) (LRB): RIGHT TOTAL HIP ARTHROPLASTY ANTERIOR APPROACH (Right) Patient reports pain as mild.    Objective: Vital signs in last 24 hours: Temp:  [98.2 F (36.8 C)-98.5 F (36.9 C)] 98.5 F (36.9 C) (10/02 0717) Pulse Rate:  [69-84] 84 (10/02 0717) Resp:  [16-18] 18 (10/02 0717) BP: (125-196)/(54-88) 196/88 (10/02 0717) SpO2:  [95 %-98 %] 96 % (10/02 0717)  Intake/Output from previous day: 10/01 0701 - 10/02 0700 In: 840 [P.O.:840] Out: -  Intake/Output this shift: No intake/output data recorded.  No results for input(s): "HGB" in the last 72 hours. No results for input(s): "WBC", "RBC", "HCT", "PLT" in the last 72 hours. No results for input(s): "NA", "K", "CL", "CO2", "BUN", "CREATININE", "GLUCOSE", "CALCIUM" in the last 72 hours. No results for input(s): "LABPT", "INR" in the last 72 hours.  Neurologically intact Neurovascular intact Sensation intact distally Intact pulses distally Dorsiflexion/Plantar flexion intact Incision: dressing C/D/I No cellulitis present Compartment soft   Assessment/Plan: 2 Days Post-Op Procedure(s) (LRB): RIGHT TOTAL HIP ARTHROPLASTY ANTERIOR APPROACH (Right) Advance diet Up with therapy D/C IV fluids Discharge home with home health once BP under control and has cleared PT WBAT RLE Hypertension- on lisinopril daily at home.  Has not had am dose yet today.  Will also order hydralazine       Cristie Hem 10/26/2022, 7:31 AM

## 2022-10-26 NOTE — Discharge Summary (Signed)
Patient ID: Kelsey Conway MRN: [id number] DOB/AGE: March 24, 1945 77 y.o.  Admit date: 10/24/2022 Discharge date: 10/26/2022  Admission Diagnoses:  Principal Problem:   Primary osteoarthritis of right hip Active Problems:   Status post total replacement of right hip   Discharge Diagnoses:  Same  Past Medical History:  Diagnosis Date   Arthritis    Chicken pox    Chronic kidney disease    Takes Farxiga for kidney protection   Diabetes mellitus without complication (HCC)    type 2 - no meds, diet controlled   Family history of polyps in the colon    Frequent headaches    Glaucoma    bilateral   Heart murmur    trivial MR 07/08/19 echo   Hypertension     Surgeries: Procedure(s): RIGHT TOTAL HIP ARTHROPLASTY ANTERIOR APPROACH on 10/24/2022   Consultants:   Discharged Condition: Improved  Hospital Course: Kelsey Conway is an 77 y.o. female who was admitted 10/24/2022 for operative treatment ofPrimary osteoarthritis of right hip. Patient has severe unremitting pain that affects sleep, daily activities, and work/hobbies. After pre-op clearance the patient was taken to the operating room on 10/24/2022 and underwent  Procedure(s): RIGHT TOTAL HIP ARTHROPLASTY ANTERIOR APPROACH.    Patient was given perioperative antibiotics:  Anti-infectives (From admission, onward)    Start     Dose/Rate Route Frequency Ordered Stop   10/25/22 1000  doxycycline (VIBRA-TABS) tablet 100 mg       Note to Pharmacy: To be taken after surgery     100 mg Oral 2 times daily 10/24/22 1649     10/24/22 1830  ceFAZolin (ANCEF) IVPB 2g/100 mL premix        2 g 200 mL/hr over 30 Minutes Intravenous Every 6 hours 10/24/22 1649 10/25/22 0003   10/24/22 1324  vancomycin (VANCOCIN) powder  Status:  Discontinued          As needed 10/24/22 1324 10/24/22 1424        Patient was given sequential compression devices, early ambulation, and chemoprophylaxis to prevent DVT.  Patient benefited maximally from hospital  stay and there were no complications.    Recent vital signs: Patient Vitals for the past 24 hrs:  BP Temp Temp src Pulse Resp SpO2  10/26/22 0717 (!) 196/88 98.5 F (36.9 C) Oral 84 18 96 %  10/26/22 0516 (!) 181/70 -- -- 75 16 95 %  10/25/22 2047 (!) 174/82 98.2 F (36.8 C) Oral 83 16 98 %  10/25/22 1519 (!) 164/77 98.2 F (36.8 C) Oral 84 18 98 %  10/25/22 0802 (!) 125/54 98.3 F (36.8 C) Oral 69 17 98 %     Recent laboratory studies: No results for input(s): "WBC", "HGB", "HCT", "PLT", "NA", "K", "CL", "CO2", "BUN", "CREATININE", "GLUCOSE", "INR", "CALCIUM" in the last 72 hours.  Invalid input(s): "PT", "2"   Discharge Medications:   Allergies as of 10/26/2022       Reactions   Latex Rash   Powder latex        Medication List     STOP taking these medications    cefadroxil 500 MG capsule Commonly known as: DURICEF   traMADol 50 MG tablet Commonly known as: ULTRAM       TAKE these medications    Accu-Chek Aviva Plus test strip Generic drug: glucose blood USE AS DIRECTED   Accu-Chek Softclix Lancets lancets USE TO TEST BLOOD SUGAR AS DIRECTED TWICE DAILY   aspirin EC 81 MG tablet Take 1  tablet (81 mg total) by mouth daily. To be taken after surgery to prevent blood clots What changed: Another medication with the same name was removed. Continue taking this medication, and follow the directions you see here.   clopidogrel 75 MG tablet Commonly known as: PLAVIX Take 75 mg by mouth daily.   docusate sodium 100 MG capsule Commonly known as: Colace Take 1 capsule (100 mg total) by mouth daily as needed. What changed: Another medication with the same name was removed. Continue taking this medication, and follow the directions you see here.   doxycycline 100 MG capsule Commonly known as: Vibramycin Take 1 capsule (100 mg total) by mouth 2 (two) times daily. To be taken after surgery   Farxiga 10 MG Tabs tablet Generic drug: dapagliflozin  propanediol Take 10 mg by mouth daily.   febuxostat 40 MG tablet Commonly known as: ULORIC Take 40 mg by mouth daily.   lisinopril 5 MG tablet Commonly known as: ZESTRIL Take 5 mg by mouth daily.   Lumigan 0.01 % Soln Generic drug: bimatoprost Place 1 drop into both eyes at bedtime.   methocarbamol 750 MG tablet Commonly known as: Robaxin-750 Take 1 tablet (750 mg total) by mouth 2 (two) times daily as needed for muscle spasms. What changed: Another medication with the same name was removed. Continue taking this medication, and follow the directions you see here.   Multivitamin Adult Chew Chew 2 each by mouth daily.   ondansetron 4 MG tablet Commonly known as: Zofran Take 1 tablet (4 mg total) by mouth every 8 (eight) hours as needed for nausea or vomiting. What changed: Another medication with the same name was removed. Continue taking this medication, and follow the directions you see here.   oxyCODONE-acetaminophen 5-325 MG tablet Commonly known as: Percocet Take 1-2 tablets by mouth every 6 (six) hours as needed. To be taken after surgery What changed: Another medication with the same name was removed. Continue taking this medication, and follow the directions you see here.   rosuvastatin 10 MG tablet Commonly known as: CRESTOR Take 20 mg by mouth daily.               Durable Medical Equipment  (From admission, onward)           Start     Ordered   10/24/22 1650  DME Walker rolling  Once       Question:  Patient needs a walker to treat with the following condition  Answer:  History of hip replacement   10/24/22 1649   10/24/22 1650  DME 3 n 1  Once        10/24/22 1649   10/24/22 1650  DME Bedside commode  Once       Question:  Patient needs a bedside commode to treat with the following condition  Answer:  History of hip replacement   10/24/22 1649            Diagnostic Studies: DG Pelvis Portable  Result Date: 10/24/2022 CLINICAL DATA:   Postop right hip arthroplasty. EXAM: PORTABLE PELVIS 1-2 VIEWS COMPARISON:  Preoperative imaging FINDINGS: Right hip arthroplasty in expected alignment. No periprosthetic lucency or fracture. Recent postsurgical change includes air and edema in the soft tissues. IMPRESSION: Right hip arthroplasty without immediate postoperative complication. Electronically Signed   By: Narda Rutherford M.D.   On: 10/24/2022 15:46   DG HIP UNILAT WITH PELVIS 1V RIGHT  Result Date: 10/24/2022 CLINICAL DATA:  Elective surgery. EXAM: DG HIP (  WITH OR WITHOUT PELVIS) 1V RIGHT COMPARISON:  Preoperative radiograph. FINDINGS: Four fluoroscopic spot views of the pelvis and right hip obtained in the operating room. Sequential images during hip arthroplasty. Fluoroscopy time 24 seconds. Dose 2.95 mGy. IMPRESSION: Intraoperative fluoroscopy for right hip arthroplasty. Electronically Signed   By: Narda Rutherford M.D.   On: 10/24/2022 15:45   DG C-Arm 1-60 Min-No Report  Result Date: 10/24/2022 Fluoroscopy was utilized by the requesting physician.  No radiographic interpretation.   DG C-Arm 1-60 Min-No Report  Result Date: 10/24/2022 Fluoroscopy was utilized by the requesting physician.  No radiographic interpretation.   XR HIP UNILAT W OR W/O PELVIS 2-3 VIEWS RIGHT  Result Date: 09/29/2022 Advanced degenerative joint disease with bone-on-bone joint space narrowing.   Disposition: Discharge disposition: 01-Home or Self Care          Follow-up Information     Cristie Hem, PA-C. Schedule an appointment as soon as possible for a visit in 2 week(s).   Specialty: Orthopedic Surgery Contact information: [address] [place] [address] [phone]                  Signed: Cristie Hem 10/26/2022, 7:34 AM

## 2022-10-26 NOTE — Progress Notes (Addendum)
Physical Therapy Treatment Patient Details Name: Kelsey Conway MRN: [id number] DOB: [birth year] Today's Date: 10/26/2022   History of Present Illness 77 y.o. female presents to [facility] hospital on 10/24/2022 for elective R THA. PMH includes HTN, insomnia, DMII, HLD, ataxia, spinal stenosis.    PT Comments  Pt today was limited due to symptomatic orthostatics. BP: 158/65 supine, BP: 131/73 seated, BP: 93/48 standing. BP: 138/69 seated after 3 minutes, BP: 103/88 standing after 3 minutes. Gait deferred for safety this session however based on mobility from yesterday pt is still cleared to go home once BP improves. RN made aware. No change in DC/DME recs at this time. PT will continue to follow.    If plan is discharge home, recommend the following: A little help with walking and/or transfers;A little help with bathing/dressing/bathroom;Assistance with cooking/housework;Assist for transportation;Help with stairs or ramp for entrance   Can travel by private vehicle        Equipment Recommendations  Rolling walker (2 wheels);BSC/3in1    Recommendations for Other Services       Precautions / Restrictions Precautions Precautions: Fall Precaution Comments: direct anterior THA Restrictions Weight Bearing Restrictions: Yes RLE Weight Bearing: Weight bearing as tolerated     Mobility  Bed Mobility Overal bed mobility: Needs Assistance Bed Mobility: Supine to Sit, Sit to Supine     Supine to sit: Supervision Sit to supine: Contact guard assist   General bed mobility comments: pt cues for using LLE to assist with RLE.    Transfers Overall transfer level: Needs assistance Equipment used: Rolling walker (2 wheels) Transfers: Sit to/from Stand Sit to Stand: Supervision           General transfer comment: Good hand placement. Pt able to stand twice to check orthostatics.    Ambulation/Gait               General Gait Details: Deferred due to orthostatics   Stairs              Wheelchair Mobility     Tilt Bed    Modified Rankin (Stroke Patients Only)       Balance                                            Cognition Arousal: Alert Behavior During Therapy: WFL for tasks assessed/performed Overall Cognitive Status: Within Functional Limits for tasks assessed                                          Exercises      General Comments General comments (skin integrity, edema, etc.): BP: 158/65 supine, BP: 131/73 seated, BP: 93/48 standing. BP: 138/69 seated after 3 minutes, BP: 103/88 standing after 3 minutes.      Pertinent Vitals/Pain Pain Assessment Pain Assessment: Faces Faces Pain Scale: Hurts a little bit Pain Location: R hip Pain Descriptors / Indicators: Sore Pain Intervention(s): Monitored during session    Home Living                          Prior Function            PT Goals (current goals can now be found in the care plan section) Progress towards PT goals: Progressing toward  goals    Frequency    Min 1X/week      PT Plan      Co-evaluation              AM-PAC PT "6 Clicks" Mobility   Outcome Measure  Help needed turning from your back to your side while in a flat bed without using bedrails?: A Little Help needed moving from lying on your back to sitting on the side of a flat bed without using bedrails?: A Little Help needed moving to and from a bed to a chair (including a wheelchair)?: A Little Help needed standing up from a chair using your arms (e.g., wheelchair or bedside chair)?: A Little Help needed to walk in hospital room?: A Little Help needed climbing 3-5 steps with a railing? : A Little 6 Click Score: 18    End of Session Equipment Utilized During Treatment: Gait belt Activity Tolerance: Other (comment) (Limited by orthostatics) Patient left: in bed;with call bell/phone within reach;with family/visitor present Nurse Communication: Mobility  status PT Visit Diagnosis: Other abnormalities of gait and mobility (R26.89);Muscle weakness (generalized) (M62.81);Pain Pain - Right/Left: Right Pain - part of body: Hip     Time: 1191-4782 PT Time Calculation (min) (ACUTE ONLY): 24 min  Charges:    $Therapeutic Activity: 23-37 mins PT General Charges $$ ACUTE PT VISIT: 1 Visit                     Kelsey Conway, PT, DPT Acute Rehab Services [phone]    Kelsey Conway 10/26/2022, 10:08 AM

## 2022-10-26 NOTE — TOC Transition Note (Signed)
Transition of Care [facility]) - CM/SW Discharge Note   Patient Details  Name: Kelsey Conway MRN: [id number] Date of Birth: 1945-11-25  Transition of Care [facility]) CM/SW Contact:  Epifanio Lesches, RN Phone Number: 10/26/2022, 10:53 AM   Clinical Narrative:    Patient will DC to: home Anticipated DC date: 10/26/2022 Family notified: yes Transport by:car       - S/P total replacement of right hip   Per MD patient will be ready for DC today pending clearance from PT. RN, patient, patient's family, and [facility] notified of DC. Husband and son to assist with care once d/c.Order noted for DME : RW and BSC. Referral made with [employer] for RW and BSC. Equipment will be delivered to bedside prior to d/c.  Pt without RX med concerns   Transportation home will be provided by pt's husband.  Post hospital f/u noted on AVS.    RNCM will sign off for now as intervention is no longer needed. Please consult [place] again if new needs arise.      Husband Kelsey Conway   Final next level of care: Home w Home Health Services Barriers to Discharge: No Barriers Identified   Patient Goals and CMS Choice   Choice offered to / list presented to : Patient  Discharge Placement                         Discharge Plan and Services Additional resources added to the After Visit Summary for                  DME Arranged: Walker rolling, Bedside commode DME Agency: [employer] Date DME Agency Contacted: 10/26/22 Time DME Agency Contacted: 1052 Representative spoke with at DME Agency: Ian Malkin HH Arranged: PT HH Agency: [facility] Date [facility] Agency Contacted: 10/26/22 Time HH Agency Contacted: 1053    Social Determinants of Health (SDOH) Interventions SDOH Screenings   Food Insecurity: No Food Insecurity (10/24/2022)  Housing: Low Risk  (10/24/2022)  Transportation Needs: No Transportation Needs (10/24/2022)  Utilities: Not At Risk (10/24/2022)  Depression (PHQ2-9): Low Risk  (02/25/2019)   Social Connections: Unknown (02/24/2022)   Received from [facility]  Tobacco Use: Low Risk  (10/12/2022)     Readmission Risk Interventions     No data to display

## 2022-10-26 NOTE — Progress Notes (Signed)
Physical Therapy Treatment Patient Details Name: Kelsey Conway MRN: [id number] DOB: [birth year] Today's Date: 10/26/2022   History of Present Illness 77 y.o. female presents to [facility] hospital on 10/24/2022 for elective R THA. PMH includes HTN, insomnia, DMII, HLD, ataxia, spinal stenosis.    PT Comments  Pt tolerated second treatment well today. BP: 164/66 supine, BP: 157/75 seated, BP: 129/68 standing. Pt asymptomatic this session. Pt able to ambulate 279ft RW supervision in hallway. No change in DC/DME recs at this time. Pt anticipates DC home either today or tomorrow.   If plan is discharge home, recommend the following: A little help with walking and/or transfers;A little help with bathing/dressing/bathroom;Assistance with cooking/housework;Assist for transportation;Help with stairs or ramp for entrance   Can travel by private vehicle        Equipment Recommendations  Rolling walker (2 wheels);BSC/3in1    Recommendations for Other Services       Precautions / Restrictions Precautions Precautions: Fall Precaution Comments: direct anterior THA Restrictions Weight Bearing Restrictions: Yes RLE Weight Bearing: Weight bearing as tolerated     Mobility  Bed Mobility Overal bed mobility: Needs Assistance Bed Mobility: Supine to Sit, Sit to Supine     Supine to sit: Supervision Sit to supine: Contact guard assist   General bed mobility comments: pt cues for using LLE to assist with RLE.    Transfers Overall transfer level: Needs assistance Equipment used: Rolling walker (2 wheels) Transfers: Sit to/from Stand Sit to Stand: Supervision           General transfer comment: Good hand placement. Pt able to stand twice to check orthostatics.    Ambulation/Gait Ambulation/Gait assistance: Supervision Gait Distance (Feet): 250 Feet Assistive device: Rolling walker (2 wheels) Gait Pattern/deviations: Decreased stride length, Step-through pattern Gait velocity: reduced      General Gait Details: no LOB noted.   Stairs             Wheelchair Mobility     Tilt Bed    Modified Rankin (Stroke Patients Only)       Balance Overall balance assessment: Needs assistance Sitting-balance support: No upper extremity supported, Feet supported Sitting balance-Leahy Scale: Good     Standing balance support: Single extremity supported, Bilateral upper extremity supported, Reliant on assistive device for balance Standing balance-Leahy Scale: Poor Standing balance comment: Reliant on RW                            Cognition Arousal: Alert Behavior During Therapy: WFL for tasks assessed/performed Overall Cognitive Status: Within Functional Limits for tasks assessed                                          Exercises      General Comments General comments (skin integrity, edema, etc.): BP: 164/66 supine, BP: 157/75 seated, BP: 129/68 standing.      Pertinent Vitals/Pain Pain Assessment Pain Assessment: Faces Faces Pain Scale: Hurts little more Pain Location: R hip Pain Descriptors / Indicators: Sore Pain Intervention(s): Monitored during session    Home Living                          Prior Function            PT Goals (current goals can now be found in the care plan  section) Progress towards PT goals: Progressing toward goals    Frequency    Min 1X/week      PT Plan      Co-evaluation              AM-PAC PT "6 Clicks" Mobility   Outcome Measure  Help needed turning from your back to your side while in a flat bed without using bedrails?: A Little Help needed moving from lying on your back to sitting on the side of a flat bed without using bedrails?: A Little Help needed moving to and from a bed to a chair (including a wheelchair)?: A Little Help needed standing up from a chair using your arms (e.g., wheelchair or bedside chair)?: A Little Help needed to walk in hospital room?: A  Little Help needed climbing 3-5 steps with a railing? : A Little 6 Click Score: 18    End of Session Equipment Utilized During Treatment: Gait belt Activity Tolerance: Other (comment) (Limited by orthostatics) Patient left: in bed;with call bell/phone within reach;with family/visitor present Nurse Communication: Mobility status PT Visit Diagnosis: Other abnormalities of gait and mobility (R26.89);Muscle weakness (generalized) (M62.81);Pain Pain - Right/Left: Right Pain - part of body: Hip     Time: 1431-1450 PT Time Calculation (min) (ACUTE ONLY): 19 min  Charges:    $Gait Training: 8-22 mins PT General Charges $$ ACUTE PT VISIT: 1 Visit                     Shela Nevin, PT, DPT Acute Rehab Services [phone]    [name] 10/26/2022, 3:22 PM

## 2022-10-26 NOTE — Progress Notes (Signed)
    Durable Medical Equipment  (From admission, onward)           Start     Ordered   10/26/22 1045  DME Bedside commode  Once       Comments: Confine to one room  Question:  Patient needs a bedside commode to treat with the following condition  Answer:  History of hip replacement   10/26/22 1045   10/24/22 1650  DME Walker rolling  Once       Question:  Patient needs a walker to treat with the following condition  Answer:  History of hip replacement   10/24/22 1649   10/24/22 1650  DME 3 n 1  Once        10/24/22 1649

## 2022-10-27 ENCOUNTER — Telehealth: Payer: Self-pay | Admitting: Orthopaedic Surgery

## 2022-10-27 NOTE — Telephone Encounter (Signed)
Irving Burton with [facility] needs PT order to see pt 2x per week for 3 weeks. She is trying to go out there tomorrow for her second visit. Please call [phone] okay to lvm on vm

## 2022-10-27 NOTE — Telephone Encounter (Signed)
Called and gave verbal 

## 2022-10-28 ENCOUNTER — Telehealth: Payer: Self-pay | Admitting: Orthopaedic Surgery

## 2022-10-28 ENCOUNTER — Telehealth: Payer: Self-pay

## 2022-10-28 NOTE — Telephone Encounter (Signed)
Patient called. Says she had a BM and it was black. Just wanted Dr. Roda Shutters to know. Her cb# [phone]

## 2022-10-28 NOTE — Telephone Encounter (Signed)
Tommy called. He is the PT working in home with patient. He says her pain has increased. Fyi. His cb# is [phone]

## 2022-10-28 NOTE — Telephone Encounter (Signed)
ok 

## 2022-10-28 NOTE — Telephone Encounter (Signed)
Patient called stating that she would like a call back concerning her first bowel movement today since last Sunday.  Patient had right THA on 10/24/2022.Kelsey Conway

## 2022-10-31 ENCOUNTER — Telehealth: Payer: Self-pay | Admitting: Orthopaedic Surgery

## 2022-10-31 NOTE — Telephone Encounter (Signed)
Patient called and said her pain is very high. Her blood pressure is high as well. She is still taking her pain medication but don't know what to do. CB#[phone]

## 2022-10-31 NOTE — Telephone Encounter (Signed)
She is on asa and plavix so don't want to add nsaids.  I would take two pain pills every six hours and can increase muscle relaxer to tid or even qid.  Keep in mind pain meds will worsen constipation.  She had issues with BP prior to surgery.  She will need to reach out to pcp in regards to BP.  Good recs for constipation

## 2022-10-31 NOTE — Telephone Encounter (Signed)
Notified patient.

## 2022-10-31 NOTE — Telephone Encounter (Signed)
S/p Right THA 10/24/2022 BP 174/86  Low grade fever she said, but was normal with home health nurse today.  Oxycodone 5mg  every 6hours, and sometimes will take 2 tablets. Muscle relaxer BID. Nothing else she is taking for pain.   She had a BM last Friday, but not since.  We talked about drinking lots of water, limiting pain medicine, and using OTC magnesium citrate if nedeed.    Please advise about pain control.

## 2022-11-08 ENCOUNTER — Encounter: Payer: Self-pay | Admitting: Physician Assistant

## 2022-11-08 ENCOUNTER — Ambulatory Visit: Payer: Medicare PPO | Admitting: Physician Assistant

## 2022-11-08 DIAGNOSIS — Z96641 Presence of right artificial hip joint: Secondary | ICD-10-CM

## 2022-11-08 MED ORDER — OXYCODONE-ACETAMINOPHEN 7.5-325 MG PO TABS: 1.0000 | ORAL_TABLET | Freq: Four times a day (QID) | ORAL | 0 refills | Status: AC | PRN

## 2022-11-08 MED ORDER — CYCLOBENZAPRINE HCL 5 MG PO TABS: 5.0000 mg | ORAL_TABLET | Freq: Three times a day (TID) | ORAL | 0 refills | Status: AC | PRN

## 2022-11-08 NOTE — Progress Notes (Signed)
Post-Op Visit Note   Patient: Kelsey Conway           Date of Birth: Dec 17, 1945           MRN: [id number] Visit Date: 11/08/2022 PCP: [facility]   Assessment & Plan:  Chief Complaint:  Chief Complaint  Patient presents with   Right Hip - Follow-up    Right total hip arthroplasty 10/24/2022   Visit Diagnoses:  1. Status post total replacement of right hip     Plan: Patient is a pleasant 77 year old female who comes in today 2 weeks status post right total hip replacement 10/24/2022.  She has been in a fair amount of pain.  She has been taking oxycodone to every 6 hours.  She tells me she was taking oxycodone 10 mg pills prior to surgery.  She has also been taking muscle relaxers without relief.  She is on Plavix and has been taking a baby aspirin as well.  She tells me she has had 1 bowel movement in the past week.  She has been getting home health physical therapy and is ambulating with a single-point cane.  Examination of her right hip reveals a well-healing surgical incision with nylon sutures in place.  No evidence of infection or cellulitis.  Calves are soft and nontender.  She is neurovascularly intact distally.  Today, sutures were removed and Steri-Strips applied.  Postoperative instructions provided.  I refilled her oxycodone and have switched her muscle relaxer.  We have also discussed trying to back off of pain medicine as constipation is the #1 side effect.  I recommended picking up a bottle of mag citrate on the way home today as well as continuing to push fluids.  She will follow-up with [place] in 4 weeks for repeat evaluation and AP pelvis x-rays.  Call with concerns or questions in meantime.  Follow-Up Instructions: Return in about 4 weeks (around 12/06/2022).   Orders:  No orders of the defined types were placed in this encounter.  No orders of the defined types were placed in this encounter.   Imaging: No new imaging  PMFS History: Patient Active Problem  List   Diagnosis Date Noted   Primary osteoarthritis of right hip 10/24/2022   Status post total replacement of right hip 10/24/2022   Spinal stenosis of lumbar region 03/29/2022   Ataxia 07/07/2019   Constipation 02/25/2019   Class 2 obesity with body mass index (BMI) of 36.0 to 36.9 in adult 05/09/2018   Low back pain 10/04/2016   Chest pain 05/10/2016   Abnormal EKG 05/10/2016   Murmur 05/10/2016   Mixed hyperlipidemia 05/10/2016   Hypertension, essential, benign 05/03/2016   Insomnia 05/03/2016   Type 2 diabetes mellitus with other specified complication (HCC) 05/03/2016   Generalized osteoarthritis of multiple sites 05/03/2016   Gastric mass 11/05/2014   Past Medical History:  Diagnosis Date   Arthritis    Chicken pox    Chronic kidney disease    Takes Farxiga for kidney protection   Diabetes mellitus without complication (HCC)    type 2 - no meds, diet controlled   Family history of polyps in the colon    Frequent headaches    Glaucoma    bilateral   Heart murmur    trivial MR 07/08/19 echo   Hypertension     Family History  Problem Relation Age of Onset   Diabetes Mother    Hypertension Mother    Hyperlipidemia Mother    Diabetes  Sister    Hypertension Sister    Diabetes Brother     Past Surgical History:  Procedure Laterality Date   ABDOMINAL HYSTERECTOMY     1970s   CHOLECYSTECTOMY     COLONOSCOPY  2024   hx polyps   EYE SURGERY Bilateral    remove cataracts   FOOT SURGERY Left    [place]   KNEE ARTHROSCOPY Right    TOTAL HIP ARTHROPLASTY Right 10/24/2022   Procedure: RIGHT TOTAL HIP ARTHROPLASTY ANTERIOR APPROACH;  Surgeon: Tarry Kos, MD;  Location: [facility] OR;  Service: Orthopedics;  Laterality: Right;  3-C   TUBAL LIGATION     Prior to hysterectomy   TUMOR REMOVAL  2017   Stomach tumor   WISDOM TOOTH EXTRACTION     Social History   Occupational History   Not on file  Tobacco Use   Smoking status: Never   Smokeless  tobacco: Never  Vaping Use   Vaping status: Never Used  Substance and Sexual Activity   Alcohol use: No   Drug use: No   Sexual activity: Yes    Birth control/protection: Surgical    Comment: Hysterectomy

## 2022-12-06 ENCOUNTER — Ambulatory Visit (INDEPENDENT_AMBULATORY_CARE_PROVIDER_SITE_OTHER): Payer: Medicare PPO | Admitting: Physician Assistant

## 2022-12-06 ENCOUNTER — Encounter: Payer: Self-pay | Admitting: Physician Assistant

## 2022-12-06 ENCOUNTER — Other Ambulatory Visit (INDEPENDENT_AMBULATORY_CARE_PROVIDER_SITE_OTHER): Payer: Medicare PPO

## 2022-12-06 DIAGNOSIS — Z96641 Presence of right artificial hip joint: Secondary | ICD-10-CM

## 2022-12-06 NOTE — Progress Notes (Signed)
Post-Op Visit Note   Patient: Kelsey Conway           Date of Birth: 1945-09-22           MRN: [id number] Visit Date: 12/06/2022 PCP: [facility]   Assessment & Plan:  Chief Complaint:  Chief Complaint  Patient presents with   Right Hip - Routine Post Op   Visit Diagnoses:  1. Status post total replacement of right hip     Plan: Patient is a pleasant 77 year old female who comes in today 6 weeks status post right total hip replacement 10/24/2022.  She is on Plavix and has been taking a baby aspirin.  She is ambulating without assistance at home and with a cane out in public.  She has some discomfort and takes an occasional oxycodone.  Overall feeling much better.  Examination of her right hip reveals a fully healed surgical scar without complication.  Minimal discomfort with hip flexion.  No pain with logroll.  Calf is soft nontender.  She is neurovascularly intact distally.  At this point, she will continue to advance with activity as tolerated.  She may discontinue her aspirin from an orthopedic standpoint as she was only taking Plavix preop.  She will follow-up with [place] in 6 weeks for recheck.  Call with concerns or questions.  Follow-Up Instructions: Return in about 6 weeks (around 01/17/2023).   Orders:  Orders Placed This Encounter  Procedures   XR Pelvis 1-2 Views   No orders of the defined types were placed in this encounter.   Imaging: XR Pelvis 1-2 Views  Result Date: 12/06/2022 Well-seated prosthesis without complication   PMFS History: Patient Active Problem List   Diagnosis Date Noted   Primary osteoarthritis of right hip 10/24/2022   Status post total replacement of right hip 10/24/2022   Spinal stenosis of lumbar region 03/29/2022   Ataxia 07/07/2019   Constipation 02/25/2019   Class 2 obesity with body mass index (BMI) of 36.0 to 36.9 in adult 05/09/2018   Low back pain 10/04/2016   Chest pain 05/10/2016   Abnormal EKG 05/10/2016   Murmur  05/10/2016   Mixed hyperlipidemia 05/10/2016   Hypertension, essential, benign 05/03/2016   Insomnia 05/03/2016   Type 2 diabetes mellitus with other specified complication (HCC) 05/03/2016   Generalized osteoarthritis of multiple sites 05/03/2016   Gastric mass 11/05/2014   Past Medical History:  Diagnosis Date   Arthritis    Chicken pox    Chronic kidney disease    Takes Farxiga for kidney protection   Diabetes mellitus without complication (HCC)    type 2 - no meds, diet controlled   Family history of polyps in the colon    Frequent headaches    Glaucoma    bilateral   Heart murmur    trivial MR 07/08/19 echo   Hypertension     Family History  Problem Relation Age of Onset   Diabetes Mother    Hypertension Mother    Hyperlipidemia Mother    Diabetes Sister    Hypertension Sister    Diabetes Brother     Past Surgical History:  Procedure Laterality Date   ABDOMINAL HYSTERECTOMY     1970s   CHOLECYSTECTOMY     COLONOSCOPY  2024   hx polyps   EYE SURGERY Bilateral    remove cataracts   FOOT SURGERY Left    [place]   KNEE ARTHROSCOPY Right    TOTAL HIP ARTHROPLASTY Right 10/24/2022  Procedure: RIGHT TOTAL HIP ARTHROPLASTY ANTERIOR APPROACH;  Surgeon: Tarry Kos, MD;  Location: [facility] OR;  Service: Orthopedics;  Laterality: Right;  3-C   TUBAL LIGATION     Prior to hysterectomy   TUMOR REMOVAL  2017   Stomach tumor   WISDOM TOOTH EXTRACTION     Social History   Occupational History   Not on file  Tobacco Use   Smoking status: Never   Smokeless tobacco: Never  Vaping Use   Vaping status: Never Used  Substance and Sexual Activity   Alcohol use: No   Drug use: No   Sexual activity: Yes    Birth control/protection: Surgical    Comment: Hysterectomy

## 2023-01-13 ENCOUNTER — Ambulatory Visit (INDEPENDENT_AMBULATORY_CARE_PROVIDER_SITE_OTHER): Payer: Medicare PPO | Admitting: Orthopaedic Surgery

## 2023-01-13 ENCOUNTER — Encounter: Payer: Self-pay | Admitting: Orthopaedic Surgery

## 2023-01-13 DIAGNOSIS — Z96641 Presence of right artificial hip joint: Secondary | ICD-10-CM

## 2023-01-13 NOTE — Progress Notes (Signed)
Post-Op Visit Note   Patient: Kelsey Conway           Date of Birth: 1945/05/04           MRN: [id number] Visit Date: 01/13/2023 PCP: [facility]   Assessment & Plan:  Chief Complaint:  Chief Complaint  Patient presents with   Right Hip - Follow-up    Right total hip arthroplasty 10/24/2022   Visit Diagnoses:  1. Status post total replacement of right hip     Plan: Kelsey Conway is 3 months status post right total hip arthroplasty on 10/24/2022.  Doing well overall.  Reports some start up stiffness and morning stiffness but overall very happy with the outcome.  Exam of the right hip shows fully healed surgical scar.  Fluid painless range of motion.  Normal gait pattern.  Dental prophylaxis reinforced.  Activity as tolerated.  Recheck in 3 months with repeat x-rays.  Follow-Up Instructions: Return in about 3 months (around 04/13/2023).   Orders:  No orders of the defined types were placed in this encounter.  No orders of the defined types were placed in this encounter.   Imaging: No results found.  PMFS History: Patient Active Problem List   Diagnosis Date Noted   Primary osteoarthritis of right hip 10/24/2022   Status post total replacement of right hip 10/24/2022   Spinal stenosis of lumbar region 03/29/2022   Ataxia 07/07/2019   Constipation 02/25/2019   Class 2 obesity with body mass index (BMI) of 36.0 to 36.9 in adult 05/09/2018   Low back pain 10/04/2016   Chest pain 05/10/2016   Abnormal EKG 05/10/2016   Murmur 05/10/2016   Mixed hyperlipidemia 05/10/2016   Hypertension, essential, benign 05/03/2016   Insomnia 05/03/2016   Type 2 diabetes mellitus with other specified complication (HCC) 05/03/2016   Generalized osteoarthritis of multiple sites 05/03/2016   Gastric mass 11/05/2014   Past Medical History:  Diagnosis Date   Arthritis    Chicken pox    Chronic kidney disease    Takes Farxiga for kidney protection   Diabetes mellitus without  complication (HCC)    type 2 - no meds, diet controlled   Family history of polyps in the colon    Frequent headaches    Glaucoma    bilateral   Heart murmur    trivial MR 07/08/19 echo   Hypertension     Family History  Problem Relation Age of Onset   Diabetes Mother    Hypertension Mother    Hyperlipidemia Mother    Diabetes Sister    Hypertension Sister    Diabetes Brother     Past Surgical History:  Procedure Laterality Date   ABDOMINAL HYSTERECTOMY     1970s   CHOLECYSTECTOMY     COLONOSCOPY  2024   hx polyps   EYE SURGERY Bilateral    remove cataracts   FOOT SURGERY Left    [place]   KNEE ARTHROSCOPY Right    TOTAL HIP ARTHROPLASTY Right 10/24/2022   Procedure: RIGHT TOTAL HIP ARTHROPLASTY ANTERIOR APPROACH;  Surgeon: Tarry Kos, MD;  Location: [facility] OR;  Service: Orthopedics;  Laterality: Right;  3-C   TUBAL LIGATION     Prior to hysterectomy   TUMOR REMOVAL  2017   Stomach tumor   WISDOM TOOTH EXTRACTION     Social History   Occupational History   Not on file  Tobacco Use   Smoking status: Never   Smokeless tobacco: Never  Vaping Use   Vaping status: Never Used  Substance and Sexual Activity   Alcohol use: No   Drug use: No   Sexual activity: Yes    Birth control/protection: Surgical    Comment: Hysterectomy

## 2023-01-23 ENCOUNTER — Other Ambulatory Visit (INDEPENDENT_AMBULATORY_CARE_PROVIDER_SITE_OTHER): Payer: Medicare PPO

## 2023-01-23 ENCOUNTER — Ambulatory Visit: Payer: Medicare PPO | Admitting: Orthopedic Surgery

## 2023-01-23 DIAGNOSIS — Z96641 Presence of right artificial hip joint: Secondary | ICD-10-CM

## 2023-01-23 NOTE — Progress Notes (Signed)
Orthopedic Spine Surgery Office Note   Assessment: Patient is a 77 y.o. female with low back pain. Her leg pain and back pain have improved since right hip replacement. Has facet arthropathy at L3/4 and L4/5 that is likely causing her back pain     Plan: -Patient has tried PT, Tylenol, intramuscular and lumbar steroid injections, right hip injection -Her back pain and leg pain has improved since hip arthroplasty.  She still has low back pain that seems consistent with facet arthropathy.  She has significant facet arthrosis at L3/4 and L4/5.  I discussed facet injections/ablations with her but she is not interested in any treatment at this time since her pain is tolerable -Patient should return to office on an as needed basis     Patient expressed understanding of the plan and all questions were answered to the patient's satisfaction.    ___________________________________________________________________________     History:   Patient is a 77 y.o. female who was previously seen in the office for her lumbar spine.  She returns today after getting her right hip replaced. She notes significant improvement in her leg pain since that surgery. She also says that her back pain has improved. She states it is currently tolerable.  She has no pain radiating to either lower extremity.  She has not developed any new symptoms since she was last seen.   Treatments tried: Activity modification, PT, Tylenol, intramuscular and lumbar steroid injections, right hip injection     Physical Exam:   General: no acute distress, appears stated age Neurologic: alert, answering questions appropriately, following commands Respiratory: unlabored breathing on room air, symmetric chest rise Psychiatric: appropriate affect, normal cadence to speech     MSK (spine):   -Strength exam                                                   Left                  Right EHL                              5/5                   5/5 TA                                 5/5                  5/5 GSC                             5/5                  5/5 Knee extension            5/5                  5/5 Hip flexion                    5/5                  5/5   -Sensory exam  Sensation intact to light touch in L3-S1 nerve distributions of bilateral lower extremities   -Straight leg raise: Negative bilaterally -Clonus: no beats bilaterally   Imaging: XRs of the lumbar spine from 01/23/2023 were independently reviewed and interpreted, showing 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  Disc height loss at L4/5. Facet arthropathy seen.  No fracture or dislocation seen.   MRI of the lumbar spine (on CD) from 03/07/2022 was previously independently reviewed and interpreted, showing central lateral recess stenosis at L3/4 with bilateral foraminal stenosis (R>L).  Lateral recess stenosis at L4/5 with an associated spondylolisthesis.  No other significant stenosis seen.     Patient name: Kelsey Conway Patient MRN: [id number] Date of visit: 01/23/23

## 2023-04-07 ENCOUNTER — Other Ambulatory Visit (INDEPENDENT_AMBULATORY_CARE_PROVIDER_SITE_OTHER): Payer: Self-pay

## 2023-04-07 ENCOUNTER — Ambulatory Visit: Admitting: Orthopaedic Surgery

## 2023-04-07 DIAGNOSIS — Z96641 Presence of right artificial hip joint: Secondary | ICD-10-CM

## 2023-04-07 MED ORDER — AMOXICILLIN 500 MG PO CAPS: ORAL_CAPSULE | ORAL | 2 refills | Status: AC

## 2023-04-07 NOTE — Progress Notes (Signed)
 Post-Op Visit Note   Patient: Kelsey Conway           Date of Birth: 1945/10/02           MRN: [id number] Visit Date: 04/07/2023 PCP: [facility]   Assessment & Plan:  Chief Complaint:  Chief Complaint  Patient presents with   Right Hip - Follow-up    Right total hip arthroplasty 10/24/2022   Visit Diagnoses:  1. Status post total replacement of right hip     Plan: Patient is a pleasant 78 year old female who comes in today 6 months status post right total hip replacement.  She has been doing well up until about a week ago when she started noticing a sharp needle type pain to the anterior lateral thigh.  She has also noticed a pulling sensation.  No injury or change in activity.  Examination of her right hip reveals minimal pain with hip flexion and logroll.  She is neurovascularly intact distally.  At this point, it is somewhat hard to tell if her symptoms are coming from her hip or referred from her lumbar spine for which she is seeing Dr. Christell Constant.  She will continue to advance with activity as tolerated.  Dental prophylaxis reinforced.  Follow-up in 6 months for repeat evaluation and AP pelvis x-rays.  Call with concerns or questions.  Follow-Up Instructions: Return in about 6 months (around 10/08/2023).   Orders:  Orders Placed This Encounter  Procedures   XR Pelvis 1-2 Views   Meds ordered this encounter  Medications   amoxicillin (AMOXIL) 500 MG capsule    Sig: Take four pills one hour prior to dental work    Dispense:  8 capsule    Refill:  2    Imaging: XR Pelvis 1-2 Views Result Date: 04/07/2023 X-rays of the pelvis show a stable right total hip replacement without complications   PMFS History: Patient Active Problem List   Diagnosis Date Noted   Primary osteoarthritis of right hip 10/24/2022   Status post total replacement of right hip 10/24/2022   Spinal stenosis of lumbar region 03/29/2022   Ataxia 07/07/2019   Constipation 02/25/2019   Class 2  obesity with body mass index (BMI) of 36.0 to 36.9 in adult 05/09/2018   Low back pain 10/04/2016   Chest pain 05/10/2016   Abnormal EKG 05/10/2016   Murmur 05/10/2016   Mixed hyperlipidemia 05/10/2016   Hypertension, essential, benign 05/03/2016   Insomnia 05/03/2016   Type 2 diabetes mellitus with other specified complication (HCC) 05/03/2016   Generalized osteoarthritis of multiple sites 05/03/2016   Gastric mass 11/05/2014   Past Medical History:  Diagnosis Date   Arthritis    Chicken pox    Chronic kidney disease    Takes Farxiga for kidney protection   Diabetes mellitus without complication (HCC)    type 2 - no meds, diet controlled   Family history of polyps in the colon    Frequent headaches    Glaucoma    bilateral   Heart murmur    trivial MR 07/08/19 echo   Hypertension     Family History  Problem Relation Age of Onset   Diabetes Mother    Hypertension Mother    Hyperlipidemia Mother    Diabetes Sister    Hypertension Sister    Diabetes Brother     Past Surgical History:  Procedure Laterality Date   ABDOMINAL HYSTERECTOMY     1970s   CHOLECYSTECTOMY     COLONOSCOPY  2024   hx polyps   EYE SURGERY Bilateral    remove cataracts   FOOT SURGERY Left    [place]gery In [place]   KNEE ARTHROSCOPY Right    TOTAL HIP ARTHROPLASTY Right 10/24/2022   Procedure: RIGHT TOTAL HIP ARTHROPLASTY ANTERIOR APPROACH;  Surgeon: Tarry Kos, MD;  Location: [facility] OR;  Service: Orthopedics;  Laterality: Right;  3-C   TUBAL LIGATION     Prior to hysterectomy   TUMOR REMOVAL  2017   Stomach tumor   WISDOM TOOTH EXTRACTION     Social History   Occupational History   Not on file  Tobacco Use   Smoking status: Never   Smokeless tobacco: Never  Vaping Use   Vaping status: Never Used  Substance and Sexual Activity   Alcohol use: No   Drug use: No   Sexual activity: Yes    Birth control/protection: Surgical    Comment: Hysterectomy

## 2023-04-13 ENCOUNTER — Ambulatory Visit: Payer: Medicare (Managed Care) | Admitting: Orthopaedic Surgery

## 2023-08-03 ENCOUNTER — Ambulatory Visit: Admitting: Podiatry

## 2023-08-03 VITALS — Ht 64.0 in | Wt 165.0 lb

## 2023-08-03 DIAGNOSIS — E119 Type 2 diabetes mellitus without complications: Secondary | ICD-10-CM | POA: Diagnosis not present

## 2023-08-03 DIAGNOSIS — D2270 Melanocytic nevi of unspecified lower limb, including hip: Secondary | ICD-10-CM

## 2023-08-06 NOTE — Progress Notes (Signed)
  Subjective:  Patient ID: Kelsey Conway, female    DOB: 10-Dec-1945,  MRN: [id number]  Chief Complaint  Patient presents with   Nail Problem    RM 9 Patient is here diabetic foot care. No additional concerns.    78 y.o. female presents with the above complaint. History confirmed with patient.  Diabetes doing well.  No new evidence of neuropathy.  She has a few moles that she would like to keep an eye on  Objective:  Physical Exam: warm, good capillary refill, no trophic changes or ulcerative lesions, normal DP and PT pulses, normal monofilament exam, normal sensory exam, and benign-appearing multiple nevi on multiple surface of both feet          Assessment:   1. Nevus of foot, unspecified laterality   2. Encounter for diabetic foot exam [facility])      Plan:  Patient was evaluated and treated and all questions answered.   Patient educated on diabetes. Discussed proper diabetic foot care and discussed risks and complications of disease. Educated patient in depth on reasons to return to the office immediately should he/she discover anything concerning or new on the feet. All questions answered. Discussed proper shoes as well.    Multiple nevi present on both feet.  Benign-appearing.  Photographs taken.  Will continue to monitor.  Advised on changing signs and symptoms that would warrant biopsy.  Return in about 1 year (around 08/02/2024) for diabetic foot exam and skin check for moles.

## 2023-10-10 ENCOUNTER — Other Ambulatory Visit (INDEPENDENT_AMBULATORY_CARE_PROVIDER_SITE_OTHER)

## 2023-10-10 ENCOUNTER — Ambulatory Visit: Admitting: Orthopaedic Surgery

## 2023-10-10 DIAGNOSIS — Z96641 Presence of right artificial hip joint: Secondary | ICD-10-CM | POA: Diagnosis not present

## 2023-10-10 NOTE — Progress Notes (Signed)
 Post-Op Visit Note   Patient: Kelsey Conway           Date of Birth: [birth year]           MRN: [id number] Visit Date: 10/10/2023 PCP: Jerel Gee, NP   Assessment & Plan:  Chief Complaint:  Chief Complaint  Patient presents with   Right Hip - Pain, Follow-up   Visit Diagnoses:  1. Status post total replacement of right hip     Plan: History of Present Illness Kelsey Conway is a 78 year old female here for a 1 year postop check status post right total hip arthroplasty.  She experiences persistent numbness around the hip area following her hip replacement surgery, accompanied by sharp pains, particularly when standing for extended periods. These symptoms disturb her sleep, making it difficult to sleep on her back and limiting her ability to lie on her side to about an hour before needing to change positions due to pain. The pain is sharp and localized around the hip, with some radiation down the thigh.  She manages chronic back pain with oxycodone . Tylenol  is ineffective for additional pain relief. She cannot take NSAIDs like Advil or Aleve due to her blood pressure condition. No new symptoms aside from those related to her hip and back pain.  Physical Exam MUSCULOSKELETAL: Her right leg is diffusely tender to palpation.  She has fluid range of motion without any objective signs of pain..  Assessment and Plan Status post right total hip arthroplasty Implants stable without complications.  Favor chronic pain syndrome as source of her continued pain. - Recommend hip strengthening exercises three to four times weekly. - Continue current back pain medication. - Schedule follow-up in one year for hip replacement checkup.  Follow-Up Instructions: Return in about 1 year (around 10/09/2024).   Orders:  Orders Placed This Encounter  Procedures   XR Pelvis 1-2 Views   No orders of the defined types were placed in this encounter.   Imaging: XR Pelvis 1-2 Views Result Date:  10/10/2023 Stable total hip replacement without complications   PMFS History: Patient Active Problem List   Diagnosis Date Noted   Primary osteoarthritis of right hip 10/24/2022   Status post total replacement of right hip 10/24/2022   Spinal stenosis of lumbar region 03/29/2022   Ataxia 07/07/2019   Constipation 02/25/2019   Class 2 obesity with body mass index (BMI) of 36.0 to 36.9 in adult 05/09/2018   Low back pain 10/04/2016   Chest pain 05/10/2016   Abnormal EKG 05/10/2016   Murmur 05/10/2016   Mixed hyperlipidemia 05/10/2016   Hypertension, essential, benign 05/03/2016   Insomnia 05/03/2016   Type 2 diabetes mellitus with other specified complication (HCC) 05/03/2016   Generalized osteoarthritis of multiple sites 05/03/2016   Gastric mass 11/05/2014   Past Medical History:  Diagnosis Date   Arthritis    Chicken pox    Chronic kidney disease    Takes Farxiga  for kidney protection   Diabetes mellitus without complication (HCC)    type 2 - no meds, diet controlled   Family history of polyps in the colon    Frequent headaches    Glaucoma    bilateral   Heart murmur    trivial MR 07/08/19 echo   Hypertension     Family History  Problem Relation Age of Onset   Diabetes Mother    Hypertension Mother    Hyperlipidemia Mother    Diabetes Sister    Hypertension Sister  Diabetes Brother     Past Surgical History:  Procedure Laterality Date   ABDOMINAL HYSTERECTOMY     1970s   CHOLECYSTECTOMY     COLONOSCOPY  2024   hx polyps   EYE SURGERY Bilateral    remove cataracts   FOOT SURGERY Left    [place] In [place]  [place]   KNEE ARTHROSCOPY Right    TOTAL HIP ARTHROPLASTY Right 10/24/2022   Procedure: RIGHT TOTAL HIP ARTHROPLASTY ANTERIOR APPROACH;  Surgeon: Jerri Kay HERO, MD;  Location: [facility] OR;  Service: Orthopedics;  Laterality: Right;  3-C   TUBAL LIGATION     Prior to hysterectomy   TUMOR REMOVAL  2017   Stomach tumor   WISDOM TOOTH EXTRACTION     Social  History   Occupational History   Not on file  Tobacco Use   Smoking status: Never   Smokeless tobacco: Never  Vaping Use   Vaping status: Never Used  Substance and Sexual Activity   Alcohol use: No   Drug use: No   Sexual activity: Yes    Birth control/protection: Surgical    Comment: Hysterectomy

## 2023-10-19 ENCOUNTER — Other Ambulatory Visit: Payer: Self-pay | Admitting: Internal Medicine

## 2023-10-19 DIAGNOSIS — Z1231 Encounter for screening mammogram for malignant neoplasm of breast: Secondary | ICD-10-CM

## 2023-11-17 ENCOUNTER — Ambulatory Visit
Admission: RE | Admit: 2023-11-17 | Discharge: 2023-11-17 | Disposition: A | Discharge status: Home / Self Care | Source: Ambulatory Visit | Attending: Internal Medicine | Admitting: Internal Medicine

## 2023-11-17 DIAGNOSIS — Z1231 Encounter for screening mammogram for malignant neoplasm of breast: Secondary | ICD-10-CM

## 2023-11-27 ENCOUNTER — Encounter: Payer: Self-pay | Admitting: Radiology

## 2023-12-13 ENCOUNTER — Ambulatory Visit: Admitting: Podiatry

## 2023-12-13 ENCOUNTER — Ambulatory Visit

## 2023-12-13 VITALS — Ht 64.0 in | Wt 165.0 lb

## 2023-12-13 DIAGNOSIS — M79672 Pain in left foot: Secondary | ICD-10-CM | POA: Diagnosis not present

## 2023-12-13 DIAGNOSIS — M7662 Achilles tendinitis, left leg: Secondary | ICD-10-CM

## 2023-12-13 NOTE — Patient Instructions (Signed)

## 2023-12-14 ENCOUNTER — Encounter: Payer: Self-pay | Admitting: Podiatry

## 2023-12-14 NOTE — Progress Notes (Signed)
  Subjective:  Patient ID: Kelsey Conway, female    DOB: [birth year],  MRN: [id number]  Chief Complaint  Patient presents with   Foot Pain    RM 3 Patient is here for left foot/ankle pain. Pt states pain present for 1 week. Pt states no injury to right foot. Pt states receiving a steroid shot at pcp visit for pain.    78 y.o. female presents with the above complaint. History confirmed with patient.  The pain is in the back of the heel  Objective:  Physical Exam: warm, good capillary refill, no trophic changes or ulcerative lesions, normal DP and PT pulses, and normal sensory exam. Left Foot: tenderness at Achilles tendon insertion, there is no discontinuity or evidence of rupture  Left radiographs taken today show no stress fracture no avulsion, there is mild Haglund forming posterior spurring Assessment:   1. Left Achilles tendinitis      Plan:  Patient was evaluated and treated and all questions answered.  Discussed the etiology and treatment options for Achilles tendinitis including stretching, formal physical therapy with an eccentric exercises therapy plan, supportive shoegears such as a running shoe or sneaker, heel lifts, topical and oral medications.  We also discussed that I do not routinely perform injections in this area because of the risk of an increased damage or rupture of the tendon.  We also discussed the role of surgical treatment of this for patients who do not improve after exhausting non-surgical treatment options.  -XR reviewed with patient -Educated on stretching and icing of the affected limb. -Recommend CAM boot immobilization to facilitate soft tissue healing and offloading of the Achilles tendon for ambulation -She just received a steroid injection yesterday to her PCP, I recommend she give this some time to see if it alleviates her inflammation, if not improved in 1 week would recommend oral prednisone  taper   Return in about 6 weeks (around 01/24/2024) for  re-check Achilles tendon.

## 2023-12-18 ENCOUNTER — Telehealth: Payer: Self-pay

## 2023-12-18 MED ORDER — PREDNISONE 10 MG PO TABS: ORAL_TABLET | ORAL | 0 refills | Status: AC

## 2023-12-18 NOTE — Telephone Encounter (Signed)
 Patient called stating when she was seen she was placed in a boot and instructed to call back if still having pain and a prescription for Prednisone  would be called in. She is requesting Prednisone .

## 2024-01-24 ENCOUNTER — Ambulatory Visit: Admitting: Podiatry

## 2024-08-06 ENCOUNTER — Ambulatory Visit: Admitting: Podiatry

## 2024-10-09 ENCOUNTER — Ambulatory Visit: Admitting: Orthopaedic Surgery
